# Patient Record
Sex: Male | Born: 1940 | Race: White | Hispanic: No | Marital: Single | Smoking: Never smoker
Health system: Southern US, Community
[De-identification: ages and names within clinical notes are randomized; demographics above are authoritative.]

## PROBLEM LIST (undated history)

## (undated) DIAGNOSIS — R079 Chest pain, unspecified: Secondary | ICD-10-CM

## (undated) DIAGNOSIS — N400 Enlarged prostate without lower urinary tract symptoms: Secondary | ICD-10-CM

## (undated) DIAGNOSIS — Q245 Malformation of coronary vessels: Secondary | ICD-10-CM

## (undated) DIAGNOSIS — I209 Angina pectoris, unspecified: Secondary | ICD-10-CM

## (undated) DIAGNOSIS — Z87442 Personal history of urinary calculi: Secondary | ICD-10-CM

## (undated) DIAGNOSIS — E079 Disorder of thyroid, unspecified: Secondary | ICD-10-CM

## (undated) DIAGNOSIS — E119 Type 2 diabetes mellitus without complications: Secondary | ICD-10-CM

## (undated) DIAGNOSIS — R9439 Abnormal result of other cardiovascular function study: Secondary | ICD-10-CM

## (undated) HISTORY — DX: Malformation of coronary vessels: Q24.5

## (undated) HISTORY — DX: Abnormal result of other cardiovascular function study: R94.39

## (undated) HISTORY — DX: Chest pain, unspecified: R07.9

## (undated) HISTORY — DX: Type 2 diabetes mellitus without complications: E11.9

## (undated) HISTORY — PX: TONSILLECTOMY: SUR1361

## (undated) HISTORY — DX: Benign prostatic hyperplasia without lower urinary tract symptoms: N40.0

## (undated) HISTORY — DX: Disorder of thyroid, unspecified: E07.9

---

## 2013-02-01 ENCOUNTER — Other Ambulatory Visit (HOSPITAL_COMMUNITY): Payer: Self-pay | Admitting: Urology

## 2013-02-01 DIAGNOSIS — C61 Malignant neoplasm of prostate: Secondary | ICD-10-CM

## 2013-02-15 ENCOUNTER — Encounter (HOSPITAL_COMMUNITY)
Admission: RE | Admit: 2013-02-15 | Discharge: 2013-02-15 | Disposition: A | Discharge status: Home / Self Care | Payer: Medicare Other | Source: Ambulatory Visit | Attending: Urology | Admitting: Urology

## 2013-02-15 DIAGNOSIS — C61 Malignant neoplasm of prostate: Secondary | ICD-10-CM

## 2013-02-15 MED ORDER — TECHNETIUM TC 99M MEDRONATE IV KIT
25.0000 | PACK | Freq: Once | INTRAVENOUS | Status: AC | PRN
  Administered 2013-02-15: 25 via INTRAVENOUS

## 2013-03-01 ENCOUNTER — Other Ambulatory Visit: Payer: Self-pay | Admitting: Internal Medicine

## 2013-03-01 DIAGNOSIS — R911 Solitary pulmonary nodule: Secondary | ICD-10-CM

## 2013-04-07 ENCOUNTER — Ambulatory Visit: Payer: Medicare Other | Admitting: Family Medicine

## 2013-04-07 VITALS — BP 120/76 | HR 55 | Temp 97.5°F | Resp 18 | Ht 69.0 in | Wt 152.0 lb

## 2013-04-07 DIAGNOSIS — Z0289 Encounter for other administrative examinations: Secondary | ICD-10-CM

## 2013-04-07 NOTE — Progress Notes (Signed)
ua-sp gr-1.010, glucose-neg, blood-neg, protein-neg       Chief Complaint:  Chief Complaint  Patient presents with  . Employment Physical    DOT    HPI: OSHEN Strong is a 73 y.o. 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 He last saw Charles Strong 2 weeks agoa and his HbA1c was 6.2.  He has had lpw sugars at 11 am, Fasting glucose was 66-67, he has hotflashes and jitters when he has sxs, he has these hypoglycemia sxs maybe 4 times every year,  HE denis any other sxs HE is a runner, runs 3 miles a day. Takes about 30 min a day , barefoot.  His heart rate is normally in the 50s, he denies CP, SOB, palpiatations.  Charles Strong dx 1993, metformin 500 mg QID, glipizide 5 mg daily No MI, No OSA  Past Medical History  Diagnosis Date  . Diabetes mellitus without complication    History reviewed. No pertinent past surgical history. History   Social History  . Marital Status: Unknown    Spouse Name: N/A    Number of Children: N/A  . Years of Education: N/A   Social History Main Topics  . Smoking status: Never Smoker   . Smokeless tobacco: None  . Alcohol Use: No  . Drug Use: No  . Sexual Activity: None   Other Topics Concern  . None   Social History Narrative  . None   History reviewed. No pertinent family history. No Known Allergies Prior to Admission medications   Medication Sig Start Date End Date Taking? Authorizing Provider  glipiZIDE (GLUCOTROL) 5 MG tablet Take 5 mg by mouth daily before breakfast.   Yes Historical Provider, MD  metFORMIN (GLUCOPHAGE) 500 MG tablet Take 500 mg by mouth 2 (two) times daily with a meal.   Yes Historical Provider, MD     ROS: The patient denies fevers, chills, night sweats, unintentional weight loss, chest pain, palpitations, wheezing, dyspnea on exertion, nausea, vomiting, abdominal pain, dysuria, hematuria, melena, numbness, weakness, or tingling.   All other systems have been reviewed and were  otherwise negative with the exception of those mentioned in the HPI and as above.    PHYSICAL EXAM: Filed Vitals:   04/07/13 1942  BP: 120/76  Pulse: 55  Temp: 97.5 F (36.4 C)  Resp: 18   Filed Vitals:   04/07/13 1942  Height: 5\' 9"  (1.753 m)  Weight: 152 lb (68.947 kg)   Body mass index is 22.44 kg/(m^2).  General: Alert, no acute distress HEENT:  Normocephalic, atraumatic, oropharynx patent. EOMI, PERRLA, fundscopic exam nl. TM nl Cardiovascular:  Regular rate and rhythm, no rubs murmurs or gallops.  No Carotid bruits, radial pulse intact. No pedal edema.  Respiratory: Clear to auscultation bilaterally.  No wheezes, rales, or rhonchi.  No cyanosis, no use of accessory musculature GI: No organomegaly, abdomen is soft and non-tender, positive bowel sounds.  No masses. Skin: No rashes. Neurologic: Facial musculature symmetric. Psychiatric: Patient is appropriate throughout our interaction. Lymphatic: No cervical lymphadenopathy Musculoskeletal: Gait intact. ROM nl 5/5 strength, sensation intact, 2/2 DTRs    LABS: No results found for this or any previous visit.   EKG/XRAY:   Primary read interpreted by Charles Strong at [facility].   ASSESSMENT/PLAN: Encounter Diagnosis  Name Primary?  . Other general medical examination for administrative purposes Yes   HE will return to get card after he gives me last OV and HbA1c  from Charles Strong office He would prefer this rather then getting a 3 month card since his card is already expired. Once I get the records then I Can write hm for a 1 year card F/u in 1 year if all is cleared  Gross sideeffects, risk and benefits, and alternatives of medications d/w patient. Patient is aware that all medications have potential sideeffects and we are unable to predict every sideeffect or drug-drug interaction that may occur.  LE, [place], DO 04/07/2013 8:28 PM

## 2013-06-16 ENCOUNTER — Other Ambulatory Visit: Payer: Self-pay | Admitting: Emergency Medicine

## 2013-06-17 LAB — HEPATITIS B SURFACE ANTIBODY, QUANTITATIVE: Hepatitis B-Post: 0 m[IU]/mL

## 2013-08-23 ENCOUNTER — Other Ambulatory Visit: Payer: Self-pay | Admitting: Urology

## 2013-08-23 DIAGNOSIS — C61 Malignant neoplasm of prostate: Secondary | ICD-10-CM

## 2013-08-30 ENCOUNTER — Ambulatory Visit
Admission: RE | Admit: 2013-08-30 | Discharge: 2013-08-30 | Disposition: A | Discharge status: Home / Self Care | Payer: Medicare Other | Source: Ambulatory Visit | Attending: Internal Medicine | Admitting: Internal Medicine

## 2013-08-30 DIAGNOSIS — R911 Solitary pulmonary nodule: Secondary | ICD-10-CM

## 2013-08-30 MED ORDER — IOHEXOL 300 MG/ML  SOLN
75.0000 mL | Freq: Once | INTRAMUSCULAR | Status: AC | PRN
  Administered 2013-08-30: 75 mL via INTRAVENOUS

## 2013-09-03 ENCOUNTER — Ambulatory Visit (HOSPITAL_COMMUNITY)
Admission: RE | Admit: 2013-09-03 | Discharge: 2013-09-03 | Disposition: A | Discharge status: Home / Self Care | Payer: Medicare Other | Source: Ambulatory Visit | Attending: Urology | Admitting: Urology

## 2013-09-03 DIAGNOSIS — C61 Malignant neoplasm of prostate: Secondary | ICD-10-CM

## 2013-09-03 DIAGNOSIS — K5730 Diverticulosis of large intestine without perforation or abscess without bleeding: Secondary | ICD-10-CM | POA: Insufficient documentation

## 2013-09-03 DIAGNOSIS — R972 Elevated prostate specific antigen [PSA]: Secondary | ICD-10-CM | POA: Insufficient documentation

## 2013-09-03 DIAGNOSIS — Z8546 Personal history of malignant neoplasm of prostate: Secondary | ICD-10-CM | POA: Insufficient documentation

## 2013-09-03 DIAGNOSIS — N400 Enlarged prostate without lower urinary tract symptoms: Secondary | ICD-10-CM | POA: Insufficient documentation

## 2013-09-03 LAB — POCT I-STAT CREATININE: Creatinine, Ser: 1 mg/dL (ref 0.50–1.35)

## 2013-09-03 MED ORDER — GADOBENATE DIMEGLUMINE 529 MG/ML IV SOLN
14.0000 mL | Freq: Once | INTRAVENOUS | Status: AC | PRN
  Administered 2013-09-03: 14 mL via INTRAVENOUS

## 2013-11-27 ENCOUNTER — Ambulatory Visit (INDEPENDENT_AMBULATORY_CARE_PROVIDER_SITE_OTHER): Payer: Medicare Other | Admitting: Family Medicine

## 2013-11-27 VITALS — BP 133/68 | HR 62 | Temp 98.4°F | Resp 16 | Ht 70.75 in | Wt 152.2 lb

## 2013-11-27 DIAGNOSIS — J029 Acute pharyngitis, unspecified: Secondary | ICD-10-CM

## 2013-11-27 DIAGNOSIS — B349 Viral infection, unspecified: Secondary | ICD-10-CM

## 2013-11-27 LAB — POCT RAPID STREP A (OFFICE): Rapid Strep A Screen: NEGATIVE

## 2013-11-27 MED ORDER — AMOXICILLIN 500 MG PO CAPS: 500.0000 mg | ORAL_CAPSULE | Freq: Three times a day (TID) | ORAL | Status: DC

## 2013-11-27 NOTE — Patient Instructions (Signed)
Sore Throat A sore throat is pain, burning, irritation, or scratchiness of the throat. There is often pain or tenderness when swallowing or talking. A sore throat may be accompanied by other symptoms, such as coughing, sneezing, fever, and swollen neck glands. A sore throat is often the first sign of another sickness, such as a cold, flu, strep throat, or mononucleosis (commonly known as mono). Most sore throats go away without medical treatment. CAUSES  The most common causes of a sore throat include:  A viral infection, such as a cold, flu, or mono.  A bacterial infection, such as strep throat, tonsillitis, or whooping cough.  Seasonal allergies.  Dryness in the air.  Irritants, such as smoke or pollution.  Gastroesophageal reflux disease (GERD). HOME CARE INSTRUCTIONS   Only take over-the-counter medicines as directed by your caregiver.  Drink enough fluids to keep your urine clear or pale yellow.  Rest as needed.  Try using throat sprays, lozenges, or sucking on hard candy to ease any pain (if older than 4 years or as directed).  Sip warm liquids, such as broth, herbal tea, or warm water with honey to relieve pain temporarily. You may also eat or drink cold or frozen liquids such as frozen ice pops.  Gargle with salt water (mix 1 tsp salt with 8 oz of water).  Do not smoke and avoid secondhand smoke.  Put a cool-mist humidifier in your bedroom at night to moisten the air. You can also turn on a hot shower and sit in the bathroom with the door closed for 5-10 minutes. SEEK IMMEDIATE MEDICAL CARE IF:  You have difficulty breathing.  You are unable to swallow fluids, soft foods, or your saliva.  You have increased swelling in the throat.  Your sore throat does not get better in 7 days.  You have nausea and vomiting.  You have a fever or persistent symptoms for more than 2-3 days.  You have a fever and your symptoms suddenly get worse. MAKE SURE YOU:   Understand  these instructions.  Will watch your condition.  Will get help right away if you are not doing well or get worse. Document Released: 03/21/2004 Document Revised: 01/29/2012 Document Reviewed: 10/20/2011 ExitCare Patient Information 2015 [employer]. This information is not intended to replace advice given to you by your health care provider. Make sure you discuss any questions you have with your health care provider.  

## 2013-11-27 NOTE — Progress Notes (Signed)
Chief Complaint:  Chief Complaint  Patient presents with  . Sore Throat  . Fever  . Cough    Mild    HPI: Charles Strong is a 73 y.o. male who is here for   Sore throat , fevers, cough x 1 day, subjective fevers and chills . Not coughing up anything. No facial pain or ear pain.  He denies chest pain, SOB or wheezing.  Has tried contact cold and flu, he fdeel ssomethat better.  Thraot pain is the biggest problems. He ahs a grandson who is 71 , and he has not been sick.  He ahs not had the flu vaccine, he does not get it sicne the last time he got it he got the "flu"   PCP: Charles Strong  Past Medical History  Diagnosis Date  . Diabetes mellitus without complication   . Thyroid disease    Past Surgical History  Procedure Laterality Date  . Tonsillectomy     History   Social History  . Marital Status: Single    Spouse Name: N/A    Number of Children: N/A  . Years of Education: N/A   Social History Main Topics  . Smoking status: Never Smoker   . Smokeless tobacco: Never Used  . Alcohol Use: No  . Drug Use: No  . Sexual Activity: None   Other Topics Concern  . None   Social History Narrative  . None   History reviewed. No pertinent family history. No Known Allergies Prior to Admission medications   Medication Sig Start Date End Date Taking? Authorizing Provider  glipiZIDE (GLUCOTROL) 5 MG tablet Take 5 mg by mouth daily before breakfast.   Yes Historical Provider, MD  LEVOTHYROXINE SODIUM PO Take 1 tablet by mouth daily.   Yes Historical Provider, MD  metFORMIN (GLUCOPHAGE) 500 MG tablet Take 500 mg by mouth 2 (two) times daily with a meal.   Yes Historical Provider, MD     ROS: The patient denies fevers, chills, night sweats, unintentional weight loss, chest pain, palpitations, wheezing, dyspnea on exertion, nausea, vomiting, abdominal pain, dysuria, hematuria, melena, numbness, weakness, or tingling.   All other systems have been reviewed and  were otherwise negative with the exception of those mentioned in the HPI and as above.    PHYSICAL EXAM: Filed Vitals:   11/27/13 1018  BP: 133/68  Pulse: 62  Temp: 98.4 F (36.9 C)  Resp: 16   Filed Vitals:   11/27/13 1018  Height: 5' 10.75" (1.797 m)  Weight: 152 lb 4 oz (69.06 kg)   Body mass index is 21.39 kg/(m^2).  General: Alert, no acute distress HEENT:  Normocephalic, atraumatic, oropharynx patent. EOMI, PERRLA, no exudates, eryth throat, no tonsils, tm normal, no facial tenderness Cardiovascular:  Regular rate and rhythm, no rubs murmurs or gallops.  No Carotid bruits, radial pulse intact. No pedal edema.  Respiratory: Clear to auscultation bilaterally.  No wheezes, rales, or rhonchi.  No cyanosis, no use of accessory musculature GI: No organomegaly, abdomen is soft and non-tender, positive bowel sounds.  No masses. Skin: No rashes. Neurologic: Facial musculature symmetric. Psychiatric: Patient is appropriate throughout our interaction. Lymphatic: No cervical lymphadenopathy Musculoskeletal: Gait intact.   LABS: Results for orders placed during the hospital encounter of 09/03/13  POCT I-STAT CREATININE      Result Value Ref Range   Creatinine, Ser 1.00  0.50 - 1.35 mg/dL     EKG/XRAY:   Primary read interpreted by  Dr. Marin Comment at [facility].   ASSESSMENT/PLAN: Encounter Diagnoses  Name Primary?  . Sore throat   . Viral illness Yes   Declines flu vaccine Otc treatment If no improvement then may take amoxacillin rx,  gave abx preacutions Throat cx pending F/u prn  Gross sideeffects, risk and benefits, and alternatives of medications d/w patient. Patient is aware that all medications have potential sideeffects and we are unable to predict every sideeffect or drug-drug interaction that may occur.  Charles Strong, [place], DO 11/27/2013 10:49 AM

## 2013-11-29 LAB — CULTURE, GROUP A STREP: Organism ID, Bacteria: NORMAL

## 2014-04-06 ENCOUNTER — Ambulatory Visit (INDEPENDENT_AMBULATORY_CARE_PROVIDER_SITE_OTHER): Payer: Self-pay | Admitting: Emergency Medicine

## 2014-04-06 VITALS — BP 128/70 | HR 53 | Temp 97.6°F | Resp 16 | Ht 69.75 in | Wt 153.2 lb

## 2014-04-06 DIAGNOSIS — Z021 Encounter for pre-employment examination: Secondary | ICD-10-CM

## 2014-04-06 NOTE — Progress Notes (Signed)
[facility] [address] [place] [address] [phone]  Date:  04/06/2014   Name:  TAVIAN CALLANDER   DOB:  Oct 17, 1940   MRN:  [id number]  PCP:  Delrae Rend, MD    Chief Complaint: Employment Physical   History of Present Illness:  DOMNICK CHERVENAK is a 74 y.o. very pleasant male patient who presents with the following:  DOT   There are no active problems to display for this patient.   Past Medical History  Diagnosis Date  . Diabetes mellitus without complication   . Thyroid disease     Past Surgical History  Procedure Laterality Date  . Tonsillectomy      History  Substance Use Topics  . Smoking status: Never Smoker   . Smokeless tobacco: Never Used  . Alcohol Use: No    No family history on file.  No Known Allergies  Medication list has been reviewed and updated.  Current Outpatient Prescriptions on File Prior to Visit  Medication Sig Dispense Refill  . LEVOTHYROXINE SODIUM PO Take 1 tablet by mouth daily.    . metFORMIN (GLUCOPHAGE) 500 MG tablet Take 500 mg by mouth 2 (two) times daily with a meal.     No current facility-administered medications on file prior to visit.    Review of Systems:  As per HPI, otherwise negative.    Physical Examination: Filed Vitals:   04/06/14 0915  BP: 128/70  Pulse: 53  Temp: 97.6 F (36.4 C)  Resp: 16   Filed Vitals:   04/06/14 0915  Height: 5' 9.75" (1.772 m)  Weight: 153 lb 3.2 oz (69.491 kg)   Body mass index is 22.13 kg/(m^2). Ideal Body Weight: Weight in (lb) to have BMI = 25: 172.6  GEN: WDWN, NAD, Non-toxic, A & O x 3 HEENT: Atraumatic, Normocephalic. Neck supple. No masses, No LAD. Ears and Nose: No external deformity. CV: RRR, No M/G/R. No JVD. No thrill. No extra heart sounds. PULM: CTA B, no wheezes, crackles, rhonchi. No retractions. No resp. distress. No accessory muscle use. ABD: S, NT, ND, +BS. No rebound. No HSM. EXTR: No c/c/e NEURO Normal gait.   PSYCH: Normally interactive. Conversant. Not depressed or anxious appearing.  Calm demeanor.    Assessment and Plan: DOT  Signed,  Ellison Carwin, MD

## 2014-09-08 ENCOUNTER — Telehealth: Payer: Self-pay | Admitting: Cardiovascular Disease

## 2014-09-08 NOTE — Telephone Encounter (Signed)
Received records from [place] for appointment on 10/11/14 with Dr Gwenlyn Found.  Records given to D Ricci Barker (medical records) for Dr Kennon Holter schedule on 10/11/14. lp

## 2014-10-11 ENCOUNTER — Ambulatory Visit: Payer: Self-pay | Admitting: Cardiovascular Disease

## 2014-11-02 ENCOUNTER — Ambulatory Visit (INDEPENDENT_AMBULATORY_CARE_PROVIDER_SITE_OTHER): Payer: Medicare HMO | Admitting: Cardiovascular Disease

## 2014-11-02 ENCOUNTER — Encounter: Payer: Self-pay | Admitting: Cardiovascular Disease

## 2014-11-02 VITALS — BP 114/66 | HR 58 | Ht 70.0 in | Wt 148.0 lb

## 2014-11-02 DIAGNOSIS — R079 Chest pain, unspecified: Secondary | ICD-10-CM

## 2014-11-02 DIAGNOSIS — R072 Precordial pain: Secondary | ICD-10-CM | POA: Diagnosis not present

## 2014-11-02 DIAGNOSIS — E119 Type 2 diabetes mellitus without complications: Secondary | ICD-10-CM | POA: Diagnosis not present

## 2014-11-02 DIAGNOSIS — E785 Hyperlipidemia, unspecified: Secondary | ICD-10-CM

## 2014-11-02 DIAGNOSIS — R7303 Prediabetes: Secondary | ICD-10-CM | POA: Insufficient documentation

## 2014-11-02 HISTORY — DX: Type 2 diabetes mellitus without complications: E11.9

## 2014-11-02 HISTORY — DX: Chest pain, unspecified: R07.9

## 2014-11-02 MED ORDER — NITROGLYCERIN 0.4 MG SL SUBL: 0.4000 mg | SUBLINGUAL_TABLET | SUBLINGUAL | Status: DC | PRN

## 2014-11-02 NOTE — Progress Notes (Signed)
Cardiology Office Note   Date:  11/02/2014   ID:  CARSTON RIEDL, DOB 06-May-1940, MRN [id number]  PCP:  Delrae Rend, MD  Cardiologist:   Sharol Harness, MD   Chief Complaint  Patient presents with  . New Evaluation  . Chest Pain    No SOB or swelling.      History of Present Illness: Charles Strong is a 74 y.o. male with diabetes, type 2, hyperlipidemia, and Hashimotos's thyroiditis who presents for an evaluation of chest pain.  Mr. Wadel reports an isolated episode of chest tightness that occurred while running three months ago.  The episode lasted for 5-6 minutes and was mild in severity.  It was not associated with shortness of breath, palpitations, lightheadedness, dizziness, nausea or vomiting.  He runs for five miles daily and has not had any recurrent episodes.  He mentioned the episode to his PCP, [name] on 09/05/14 and was referred to cardiology for further evaluation.  Dr. Buddy Duty has been recommending a statin, but Mr. Vosler declines.  He is afraid of the things he has heard about statins and prefers to take supplements, including red yeast rice, instead.  Mr. Hoel denies orthopnea, PND or LE edema.  In addition to daily exercise he eats a healthy diet and has lost 7 lb in the last 6 months.  This was unintentional, but he attributes this to not eating as much or drinking any alcohol now that he does not have frequent dinners with his daughter.    Past Medical History  Diagnosis Date  . Diabetes mellitus without complication   . Thyroid disease     Past Surgical History  Procedure Laterality Date  . Tonsillectomy       Current Outpatient Prescriptions  Medication Sig Dispense Refill  . Alpha-Lipoic Acid (LIPOIC ACID PO) Take 300 mg by mouth daily.    . APPLE CIDER VINEGAR PO Take 250 mg by mouth daily.    Marland Kitchen aspirin 81 MG tablet Take 81 mg by mouth daily.    . Cholecalciferol (VITAMIN D3) 5000 UNITS CAPS Take by mouth once a week.      . Coenzyme Q10 (CO Q 10 PO) Take 200 mg by mouth daily.    . Digestive Enzymes (ENZYME DIGEST PO) Take by mouth daily.    Marland Kitchen GARLIC PO Take by mouth 3 (three) times daily. TAKES 2 CAPSULES THREE TIMES DAILY    . glipiZIDE (GLUCOTROL XL) 5 MG 24 hr tablet Take 5 mg by mouth as needed.  2  . Hyaluronic Acid-Vitamin C (HYALURONIC ACID PO) Take 50 mg by mouth daily.    Marland Kitchen LEVOTHYROXINE SODIUM PO Take 75 mg by mouth daily.     Marland Kitchen liothyronine (CYTOMEL) 5 MCG tablet Take 5 mcg by mouth daily.    . metFORMIN (GLUCOPHAGE-XR) 500 MG 24 hr tablet Take 500 mg by mouth 4 (four) times daily.  4  . Multiple Vitamins-Minerals (ONE-A-DAY ENERGY) TABS Take by mouth daily.    . Nattokinase 100 MG CAPS Take 100 mg by mouth daily.    . Omega-3 Fatty Acids (FISH OIL) 1000 MG CAPS Take by mouth daily.    . Red Yeast Rice Extract (RED YEAST RICE PO) Take 1,200 mg by mouth 3 (three) times daily. TAKES THREE TABLETS WITH EACH MEAL.    Marland Kitchen Specialty Vitamins Products (PROSTATE PO) Take by mouth daily.    Marland Kitchen TAURINE PO Take by mouth daily.    Marland Kitchen Ubiquinol Liposomal (QH  PO) Take 100 mg by mouth.    . vitamin C (ASCORBIC ACID) 500 MG tablet Take 500 mg by mouth daily.    . Vitamins A & D (VITAMIN A & D) 10000-400 UNITS CAPS Take by mouth daily.    . Vitamins-Lipotropics (B-100 PO) Take by mouth daily.    . Wheat Dextrin (BENEFIBER PO) Take by mouth daily.    . WHEY PROTEIN PO Take by mouth daily.    . WHITE KIDNEY BEAN PO Take 500 mg by mouth 3 (three) times daily.    . Zinc 25 MG TABS Take 25 mg by mouth daily.    . nitroGLYCERIN (NITROSTAT) 0.4 MG SL tablet Place 1 tablet (0.4 mg total) under the tongue every 5 (five) minutes as needed for chest pain. 25 tablet 6   No current facility-administered medications for this visit.    Allergies:   Review of patient's allergies indicates no known allergies.    Social History:  The patient  reports that he has never smoked. He has never used smokeless tobacco. He reports that  he does not drink alcohol or use illicit drugs.   Family History:  The patient's family history is not on file.    ROS:  Please see the history of present illness.   Otherwise, review of systems are positive for none.   All other systems are reviewed and negative.    PHYSICAL EXAM: VS:  BP 114/66 mmHg  Pulse 58  Ht 5\' 10"  (1.778 m)  Wt 67.132 kg (148 lb)  BMI 21.24 kg/m2 , BMI Body mass index is 21.24 kg/(m^2). GENERAL:  Well appearing HEENT:  Pupils equal round and reactive, fundi not visualized, oral mucosa unremarkable NECK:  No jugular venous distention, waveform within normal limits, carotid upstroke brisk and symmetric, no bruits, no thyromegaly LYMPHATICS:  No cervical adenopathy LUNGS:  Clear to auscultation bilaterally HEART:  RRR.  PMI not displaced or sustained,S1 and S2 within normal limits, no S3, no S4, no clicks, no rubs, no murmurs ABD:  Flat, positive bowel sounds normal in frequency in pitch, no bruits, no rebound, no guarding, no midline pulsatile mass, no hepatomegaly, no splenomegaly EXT:  2 plus pulses throughout, no edema, no cyanosis no clubbing SKIN:  No rashes no nodules NEURO:  Cranial nerves II through XII grossly intact, motor grossly intact throughout PSYCH:  Cognitively intact, oriented to person place and time    EKG:  EKG is ordered today. The ekg ordered today demonstrates sinus bradycardia at 58 bpm.   Recent Labs: No results found for requested labs within last 365 days.    Lipid Panel No results found for: CHOL, TRIG, HDL, CHOLHDL, VLDL, LDLCALC, LDLDIRECT   Hgb A1c 6.6% Na 142, K 4.3, BUN 12, Creatinine 0.91 AST 22, ALT 24 Chol 129, tri 66, HDL 46, LDL 69  Wt Readings from Last 3 Encounters:  11/02/14 67.132 kg (148 lb)  04/06/14 69.491 kg (153 lb 3.2 oz)  11/27/13 69.06 kg (152 lb 4 oz)      Other studies Reviewed: Additional studies/ records that were reviewed today include: n/a. Review of the above records demonstrates:   Please see elsewhere in the note.  n/a   ASSESSMENT AND PLAN:  # Chest pain: Mr. Meir chest pain occurred 3 months ago and has not recurred since despite the fact that he runs 5 miles daily. This makes it less likely that his symptoms were ischemic in nature. However he does have several risk factors including  age, diabetes, and hyperlipidemia. Therefore we will pursue an exercise treadmill stress test. He has been prescribed nitroglycerin sublingual when necessary and will report to the emergency department if he develops chest pain that does not resolve after taking nitroglycerin.   # Hyperlipidemia:  ASCVD 10 year risk is 24.6%.  We discussed statin therapy, as he has discussed with his PCP Dr. Buddy Duty.  He will think about it and follow up with Dr. Buddy Duty if he is willing to try either atorvastatin or rosuvastatin.  Current medicines are reviewed at length with the patient today.  The patient does not have concerns regarding medicines.  The following changes have been made:  no change  Labs/ tests ordered today include:   Orders Placed This Encounter  Procedures  . Exercise Tolerance Test  . EKG 12-Lead     Disposition:   FU with Dr. Jonelle Sidle C. Oval Linsey prn   Signed, Sharol Harness, MD  11/02/2014 6:33 PM    [place]

## 2014-11-02 NOTE — Patient Instructions (Signed)
Your physician has requested that you have an exercise tolerance test. For further information please visit [email or web address]. Please also follow instruction sheet, as given.    Your physician recommends that you schedule a follow-up appointment on an as needed basis--    Nitroglycerin sublingual tablets What is this medicine? NITROGLYCERIN (nye troe GLI ser in) is a type of vasodilator. It relaxes blood vessels, increasing the blood and oxygen supply to your heart. This medicine is used to relieve chest pain caused by angina. It is also used to prevent chest pain before activities like climbing stairs, going outdoors in cold weather, or sexual activity. This medicine may be used for other purposes; ask your health care provider or pharmacist if you have questions. COMMON BRAND NAME(S): Nitroquick, Nitrostat, Nitrotab What should I tell my health care provider before I take this medicine? They need to know if you have any of these conditions: -anemia -head injury, recent stroke, or bleeding in the brain -liver disease -previous heart attack -an unusual or allergic reaction to nitroglycerin, other medicines, foods, dyes, or preservatives -pregnant or trying to get pregnant -breast-feeding How should I use this medicine? Take this medicine by mouth as needed. At the first sign of an angina attack (chest pain or tightness) place one tablet under your tongue. You can also take this medicine 5 to 10 minutes before an event likely to produce chest pain. Follow the directions on the prescription label. Let the tablet dissolve under the tongue. Do not swallow whole. Replace the dose if you accidentally swallow it. It will help if your mouth is not dry. Saliva around the tablet will help it to dissolve more quickly. Do not eat or drink, smoke or chew tobacco while a tablet is dissolving. If you are not better within 5 minutes after taking ONE dose of nitroglycerin, call 9-1-1 immediately to seek  emergency medical care. Do not take more than 3 nitroglycerin tablets over 15 minutes. If you take this medicine often to relieve symptoms of angina, your doctor or health care professional may provide you with different instructions to manage your symptoms. If symptoms do not go away after following these instructions, it is important to call 9-1-1 immediately. Do not take more than 3 nitroglycerin tablets over 15 minutes. Talk to your pediatrician regarding the use of this medicine in children. Special care may be needed. Overdosage: If you think you have taken too much of this medicine contact a poison control center or emergency room at once. NOTE: This medicine is only for you. Do not share this medicine with others. What if I miss a dose? This does not apply. This medicine is only used as needed. What may interact with this medicine? Do not take this medicine with any of the following medications: -certain migraine medicines like ergotamine and dihydroergotamine (DHE) -medicines used to treat erectile dysfunction like sildenafil, tadalafil, and vardenafil -riociguat This medicine may also interact with the following medications: -alteplase -aspirin -heparin -medicines for high blood pressure -medicines for mental depression -other medicines used to treat angina -phenothiazines like chlorpromazine, mesoridazine, prochlorperazine, thioridazine This list may not describe all possible interactions. Give your health care provider a list of all the medicines, herbs, non-prescription drugs, or dietary supplements you use. Also tell them if you smoke, drink alcohol, or use illegal drugs. Some items may interact with your medicine. What should I watch for while using this medicine? Tell your doctor or health care professional if you feel your medicine is no longer working.  Keep this medicine with you at all times. Sit or lie down when you take your medicine to prevent falling if you feel dizzy or  faint after using it. Try to remain calm. This will help you to feel better faster. If you feel dizzy, take several deep breaths and lie down with your feet propped up, or bend forward with your head resting between your knees. You may get drowsy or dizzy. Do not drive, use machinery, or do anything that needs mental alertness until you know how this drug affects you. Do not stand or sit up quickly, especially if you are an older patient. This reduces the risk of dizzy or fainting spells. Alcohol can make you more drowsy and dizzy. Avoid alcoholic drinks. Do not treat yourself for coughs, colds, or pain while you are taking this medicine without asking your doctor or health care professional for advice. Some ingredients may increase your blood pressure. What side effects may I notice from receiving this medicine? Side effects that you should report to your doctor or health care professional as soon as possible: -blurred vision -dry mouth -skin rash -sweating -the feeling of extreme pressure in the head -unusually weak or tired Side effects that usually do not require medical attention (report to your doctor or health care professional if they continue or are bothersome): -flushing of the face or neck -headache -irregular heartbeat, palpitations -nausea, vomiting This list may not describe all possible side effects. Call your doctor for medical advice about side effects. You may report side effects to FDA at 1-800-FDA-1088. Where should I keep my medicine? Keep out of the reach of children. Store at room temperature between 20 and 25 degrees C (68 and 77 degrees F). Store in [employer]. Protect from light and moisture. Keep tightly closed. Throw away any unused medicine after the expiration date. NOTE: This sheet is a summary. It may not cover all possible information. If you have questions about this medicine, talk to your doctor, pharmacist, or health care provider.  2015,  Elsevier/Gold Standard. (2012-12-10 17:57:36)

## 2014-11-25 ENCOUNTER — Telehealth (HOSPITAL_COMMUNITY): Payer: Self-pay

## 2014-11-25 NOTE — Telephone Encounter (Signed)
Encounter complete. 

## 2014-11-30 ENCOUNTER — Ambulatory Visit (HOSPITAL_COMMUNITY)
Admission: RE | Admit: 2014-11-30 | Discharge: 2014-11-30 | Disposition: A | Discharge status: Home / Self Care | Payer: Medicare HMO | Source: Ambulatory Visit | Attending: Cardiovascular Disease | Admitting: Cardiovascular Disease

## 2014-11-30 DIAGNOSIS — R072 Precordial pain: Secondary | ICD-10-CM

## 2014-11-30 DIAGNOSIS — R079 Chest pain, unspecified: Secondary | ICD-10-CM | POA: Diagnosis not present

## 2014-11-30 DIAGNOSIS — R9439 Abnormal result of other cardiovascular function study: Secondary | ICD-10-CM | POA: Diagnosis not present

## 2014-12-01 LAB — EXERCISE TOLERANCE TEST
Estimated workload: 11.8 METS
Exercise duration (min): 10 min
Exercise duration (sec): 3 s
MPHR: 148 {beats}/min
Peak HR: 169 {beats}/min
Percent HR: 114 %
RPE: 16
Rest HR: 65 {beats}/min

## 2014-12-05 ENCOUNTER — Telehealth: Payer: Self-pay | Admitting: *Deleted

## 2014-12-05 DIAGNOSIS — D689 Coagulation defect, unspecified: Secondary | ICD-10-CM

## 2014-12-05 DIAGNOSIS — Z01818 Encounter for other preprocedural examination: Secondary | ICD-10-CM

## 2014-12-05 DIAGNOSIS — R9430 Abnormal result of cardiovascular function study, unspecified: Secondary | ICD-10-CM

## 2014-12-05 NOTE — Telephone Encounter (Signed)
-----   Message from Skeet Latch, MD sent at 12/04/2014 11:12 AM EDT ----- Abnormal stress that is concerning for areas of the heart that don't get enough blood flow with exercise.  Please arrange for cath.

## 2014-12-05 NOTE — Telephone Encounter (Signed)
Spoke to patient. Result given . Verbalized understanding Pt would like to proceed with cardiac cath. He states he will call back with the time he is available Lab ordered. Left heart cath ordered

## 2014-12-15 ENCOUNTER — Telehealth: Payer: Self-pay | Admitting: Cardiovascular Disease

## 2014-12-15 DIAGNOSIS — E785 Hyperlipidemia, unspecified: Secondary | ICD-10-CM

## 2014-12-15 NOTE — Telephone Encounter (Signed)
Mr. Charles Strong is asking if Dr. Oval Linsey can help him with something , Would not say what it is,, Please call him on his cell at [phone]   Thanks

## 2014-12-15 NOTE — Telephone Encounter (Signed)
Pt stated that during an OV Dr. Oval Linsey recommended that he have a heart cath and pt stated that after researching he things that is to risky.  Pt stated he thinks he would feel more comfortable having a "heart ultrasound'.  Pt educated on the differences between an echocardiogram and a heart cath. Told pt i would share his concerns with MD and our office will get back with him once we hear back from her.  Pt verbalized understanding no questions at this time.

## 2014-12-19 DIAGNOSIS — E785 Hyperlipidemia, unspecified: Secondary | ICD-10-CM | POA: Diagnosis not present

## 2014-12-19 MED ORDER — ISOSORBIDE MONONITRATE ER 30 MG PO TB24: 30.0000 mg | ORAL_TABLET | Freq: Every day | ORAL | Status: DC

## 2014-12-19 NOTE — Telephone Encounter (Signed)
I spoke with Charles Strong this weekend and he is not interested in having a heart catheterization at this time.  He prefers to manage his symptoms medically.  We will start Imdur 30 mg daily.  He will have his lipids checked and symptoms assessed at his follow up appointment in one month.

## 2014-12-19 NOTE — Telephone Encounter (Signed)
Called patient and informed him that MD has advised Rx for Imdur 30mg  and lipid panel. Patient voiced understanding. He was scheduled for follow up with Dr. Gwenlyn Found on 11/15 (as he was supposed to have [place] with him but decided against this) and this appointment has been moved to Dr. Blenda Mounts schedule for Friday 01/06/15.   Lab & med ordered. Patient aware he will need fasting labs prior to OV. Provided location for [facility] in our building.

## 2014-12-20 DIAGNOSIS — R69 Illness, unspecified: Secondary | ICD-10-CM | POA: Diagnosis not present

## 2014-12-20 NOTE — Telephone Encounter (Signed)
Per recent telephone message. Patient has decided not to have heart catheterization.

## 2014-12-21 LAB — LIPID PANEL
Cholesterol: 163 mg/dL (ref 125–200)
HDL: 63 mg/dL (ref >=40)
LDL Cholesterol: 85 mg/dL (ref <130)
Total CHOL/HDL Ratio: 2.6 Ratio (ref <=5.0)
Triglycerides: 73 mg/dL (ref <150)
VLDL: 15 mg/dL (ref <30)

## 2014-12-22 ENCOUNTER — Telehealth: Payer: Self-pay | Admitting: *Deleted

## 2014-12-22 DIAGNOSIS — R69 Illness, unspecified: Secondary | ICD-10-CM | POA: Diagnosis not present

## 2014-12-22 NOTE — Telephone Encounter (Signed)
-----   Message from Skeet Latch, MD sent at 12/22/2014  8:11 AM EDT ----- Cholesterol numbers are good.  We still recommend a low-dose statin based on his diabetes and likely coronary disease.  However, we can discuss this when he follows up.

## 2014-12-22 NOTE — Telephone Encounter (Signed)
Spoke to patient. Result given . Verbalized understanding  appt 01/06/15 w/ DR [facility]

## 2015-01-06 ENCOUNTER — Ambulatory Visit (INDEPENDENT_AMBULATORY_CARE_PROVIDER_SITE_OTHER): Payer: Medicare HMO | Admitting: Cardiovascular Disease

## 2015-01-06 ENCOUNTER — Encounter: Payer: Self-pay | Admitting: Cardiovascular Disease

## 2015-01-06 VITALS — BP 94/52 | HR 56 | Ht 70.0 in | Wt 149.0 lb

## 2015-01-06 DIAGNOSIS — I2510 Atherosclerotic heart disease of native coronary artery without angina pectoris: Secondary | ICD-10-CM | POA: Diagnosis not present

## 2015-01-06 DIAGNOSIS — R9439 Abnormal result of other cardiovascular function study: Secondary | ICD-10-CM

## 2015-01-06 NOTE — Progress Notes (Signed)
Cardiology Office Note   Date:  01/09/2015  ID:  [name], DOB May 26, 1940, MRN [id number]  PCP:  Charles Rend, MD  Cardiologist:   Sharol Harness, MD   Chief Complaint  Patient presents with  . Follow-up    STRESS TEST, ONGOING CHEST PAIN       Patient ID: Charles Strong is a 74 y.o. male with diabetes, type 2, hyperlipidemia, and Hashimotos's thyroiditis who presents for follow up on medically managed CAD.  Interval history 01/06/15: Charles Strong had an abnrmal stress test and declined cath.  He was found to have ST depression in the inferior leads that occurred 10 minutes into exercise. He prefers to try medical management.  He started Imdur 30 and continues to have episodes of chest discomfort. He has cut back his running from 5 miles 7 days a week to 1 mile 3 days per week. This is partially due to chest discomfort and partially due to fear of worsening his heart disease. Charles Strong also has left arm pain and reports an electrical shooting pain that goes down his arm with movement. He is noted less arm pain since starting Imdur.    Charles Strong has started taking several over-the-counter supplements that are supposed to be good for cardiovascular health. He is also changed his diet by eliminating carbohydrates and increasing his intake of vegetables.  History of Present Illness Charles Strong reports an isolated episode of chest tightness that occurred while running three months ago.  The episode lasted for 5-6 minutes and was mild in severity.  It was not associated with shortness of breath, palpitations, lightheadedness, dizziness, nausea or vomiting.  He runs for five miles daily and has not had any recurrent episodes.  He mentioned the episode to his PCP, Dr. Delrae Strong on 09/05/14 and was referred to cardiology for further evaluation.  Dr. Buddy Strong has been recommending a statin, but Charles Strong declines.  He is afraid of the things he has heard about  statins and prefers to take supplements, including red yeast rice, instead.  Charles Strong denies orthopnea, PND or LE edema.  In addition to daily exercise he eats a healthy diet and has lost 7 lb in the last 6 months.  This was unintentional, but he attributes this to not eating as much or drinking any alcohol now that he does not have frequent dinners with his daughter.    Past Medical History  Diagnosis Date  . Diabetes mellitus without complication ([place])   . Thyroid disease   . Chest pain 11/02/2014  . Diabetes type 2, controlled ([place]) 11/02/2014  . Abnormal stress ECG 01/09/2015    Past Surgical History  Procedure Laterality Date  . Tonsillectomy       Current Outpatient Prescriptions  Medication Sig Dispense Refill  . Acetylcysteine (NAC 600 PO) Take 1 tablet by mouth 2 (two) times daily.    . Arginine 1000 MG TABS Take 1 tablet by mouth 2 (two) times daily.    . Ascorbic Acid (VITAMIN C) 1000 MG tablet Take 1,000 mg by mouth daily.    Marland Kitchen CITRULLINE 1000 PO Take 1 tablet by mouth daily.    . Coconut Oil 1000 MG CAPS Take 1 capsule by mouth 2 (two) times daily.    . Ginger, Zingiber officinalis, (GINGER ROOT) 550 MG CAPS Take 1 capsule by mouth daily.    . Lecithin 1200 MG CAPS Take 1 capsule by mouth daily.    . Magnesium 250 MG  TABS Take 1 tablet by mouth 4 (four) times daily.    . Potassium 99 MG TABS Take 4 tablets by mouth daily.    . Alpha-Lipoic Acid (LIPOIC ACID PO) Take 100 mg by mouth daily.     . APPLE CIDER VINEGAR PO Take 250 mg by mouth daily.    Marland Kitchen aspirin 81 MG tablet Take 81 mg by mouth daily.    . Cholecalciferol (VITAMIN D3) 5000 UNITS CAPS Take by mouth once a week.    . Coenzyme Q10 (CO Q 10 PO) Take 200 mg by mouth daily.    . Digestive Enzymes (ENZYME DIGEST PO) Take by mouth daily.    Marland Kitchen GARLIC PO Take by mouth. TAKES 3 CAPSULES THREE TIMES DAILY    . glipiZIDE (GLUCOTROL XL) 5 MG 24 hr tablet Take 5 mg by mouth as needed (DIABETES).   2  . Hyaluronic  Acid-Vitamin C (HYALURONIC ACID PO) Take 50 mg by mouth daily.    . isosorbide mononitrate (IMDUR) 30 MG 24 hr tablet Take 1 tablet (30 mg total) by mouth daily. 30 tablet 3  . LEVOTHYROXINE SODIUM PO Take 75 mg by mouth daily.     Marland Kitchen liothyronine (CYTOMEL) 5 MCG tablet Take 5 mcg by mouth daily.    . metFORMIN (GLUCOPHAGE-XR) 500 MG 24 hr tablet Take 500 mg by mouth 4 (four) times daily.  4  . Multiple Vitamins-Minerals (ONE-A-DAY ENERGY) TABS Take by mouth daily.    . Nattokinase 100 MG CAPS Take 100 mg by mouth daily.    . nitroGLYCERIN (NITROSTAT) 0.4 MG SL tablet Place 1 tablet (0.4 mg total) under the tongue every 5 (five) minutes as needed for chest pain. 25 tablet 6  . Omega-3 Fatty Acids (FISH OIL) 1000 MG CAPS Take by mouth daily.    Glory Rosebush VERIO test strip CHECK BLOOD SUGARS TID  5  . Red Yeast Rice Extract (RED YEAST RICE PO) Take 1,200 mg by mouth 3 (three) times daily. TAKES THREE TABLETS WITH EACH MEAL.    Marland Kitchen Specialty Vitamins Products (PROSTATE PO) Take by mouth daily.    Marland Kitchen TAURINE PO Take by mouth daily.    Marland Kitchen Ubiquinol Liposomal (QH PO) Take 100 mg by mouth.    . Vitamins A & D (VITAMIN A & D) 10000-400 UNITS CAPS Take by mouth daily.    . Vitamins-Lipotropics (B-100 PO) Take by mouth daily.    . WHEY PROTEIN PO Take by mouth daily.    . Zinc 25 MG TABS Take 25 mg by mouth daily.     No current facility-administered medications for this visit.    Allergies:   Review of patient's allergies indicates no known allergies.    Social History:  The patient  reports that he has never smoked. He has never used smokeless tobacco. He reports that he does not drink alcohol or use illicit drugs.   Family History:  The patient's family history is not on file.    ROS:  Please see the history of present illness.  Otherwise, review of systems are positive for none.   All other systems are reviewed and negative.    PHYSICAL EXAM: VS:  BP 94/52 mmHg  Pulse 56  Ht 5\' 10"  (1.778 m)   Wt 67.586 kg (149 lb)  BMI 21.38 kg/m2 , BMI Body mass index is 21.38 kg/(m^2). GENERAL:  Well appearing HEENT:  Pupils equal round and reactive, fundi not visualized, oral mucosa unremarkable NECK:  No jugular venous  distention, waveform within normal limits, carotid upstroke brisk and symmetric, no bruits, no thyromegaly LYMPHATICS:  No cervical adenopathy LUNGS:  Clear to auscultation bilaterally HEART:  RRR.  PMI not displaced or sustained,S1 and S2 within normal limits, no S3, no S4, no clicks, no rubs, no murmurs ABD:  Flat, positive bowel sounds normal in frequency in pitch, no bruits, no rebound, no guarding, no midline pulsatile mass, no hepatomegaly, no splenomegaly EXT:  2 plus pulses throughout, no edema, no cyanosis no clubbing SKIN:  No rashes no nodules NEURO:  Cranial nerves II through XII grossly intact, motor grossly intact throughout PSYCH:  Cognitively intact, oriented to person place and time   EKG:  EKG is not ordered today. The ekg ordered today demonstrates sinus bradycardia at 58 bpm.  11/30/14 ETT:  Blood pressure demonstrated a hypertensive response to exercise.  Horizontal ST segment depression ST segment depression was noted during stress in the II, III and aVF leads, beginning at 10 minutes of stress, and returning to baseline after less than 1 minute of recovery.   Recent Labs: No results found for requested labs within last 365 days.    Lipid Panel    Component Value Date/Time   CHOL 163 12/19/2014 0913   TRIG 73 12/19/2014 0913   HDL 63 12/19/2014 0913   CHOLHDL 2.6 12/19/2014 0913   VLDL 15 12/19/2014 0913   LDLCALC 85 12/19/2014 0913     Hgb A1c 6.6% Na 142, K 4.3, BUN 12, Creatinine 0.91 AST 22, ALT 24 Chol 129, tri 66, HDL 46, LDL 69  Wt Readings from Last 3 Encounters:  01/06/15 67.586 kg (149 lb)  11/02/14 67.132 kg (148 lb)  04/06/14 69.491 kg (153 lb 3.2 oz)     Other studies Reviewed: Additional studies/ records that were  reviewed today include: n/a. Review of the above records demonstrates:  Please see elsewhere in the note.  n/a   ASSESSMENT AND PLAN:  # CAD/ angina: Mr. Noguez had a positive stress test and continues to have episodes of chest discomfort that are concerning for ischemia. His symptoms have improved with Imdur but persist. He continues to be on interested in pursuing cardiac catheterization.  We discussed the fact that I cannot determine the severity of his potential cardiac disease based on a treadmill stress test alone. We will pursue coronary CT angiography to assess the severity of his underlying coronary disease. If he has disease that is high risk or surgical disease he will consider more aggressive medical care. He does not have any evidence of heart failure on exam.  His blood pressure is too add a beta blocker or titrate his long-acting nitrates. We will consider adding Ranexa based on the results of his CT angiography.  # Hyperlipidemia:  ASCVD 10 year risk is 24.6%.  He now also has a diagnosis of CAD.  We discussed statin therapy, as he has discussed with his PCP Dr. Buddy Strong.  Charles Strong remains on interested in starting a statin and prefers to continue red yeast Rice.  Current medicines are reviewed at length with the patient today.  The patient does not have concerns regarding medicines.  The following changes have been made:  no change  Labs/ tests ordered today include:   Orders Placed This Encounter  Procedures  . CT ANGIO CHEST AORTA W/CM &/OR WO/CM  . Basic metabolic panel     Disposition:   FU with Dr. Jonelle Sidle C. [place] in 6 months.   Signed, Skeet Latch  P, MD  01/09/2015 6:09 PM    [facility]

## 2015-01-06 NOTE — Patient Instructions (Signed)
Dr Oval Linsey has ordered a CT Angiogram of the chest. This will be done at our [facility] location. The address is [address]. The phone number is [phone].  You will have to have some blood work done prior to this test. It can be done at any [place] lab.  Dr Oval Linsey recommends that you schedule a follow-up appointment in 6 months. You will receive a reminder letter in the mail two months in advance. If you don't receive a letter, please call our office to schedule the follow-up appointment.  If you need a refill on your cardiac medications before your next appointment, please call your pharmacy.

## 2015-01-09 ENCOUNTER — Encounter: Payer: Self-pay | Admitting: Cardiovascular Disease

## 2015-01-09 DIAGNOSIS — R9439 Abnormal result of other cardiovascular function study: Secondary | ICD-10-CM

## 2015-01-09 HISTORY — DX: Abnormal result of other cardiovascular function study: R94.39

## 2015-01-10 ENCOUNTER — Ambulatory Visit: Payer: Self-pay | Admitting: Cardiovascular Disease

## 2015-01-10 DIAGNOSIS — I2510 Atherosclerotic heart disease of native coronary artery without angina pectoris: Secondary | ICD-10-CM | POA: Diagnosis not present

## 2015-01-11 ENCOUNTER — Other Ambulatory Visit: Payer: Self-pay

## 2015-01-11 ENCOUNTER — Telehealth: Payer: Self-pay | Admitting: Cardiovascular Disease

## 2015-01-11 DIAGNOSIS — I2510 Atherosclerotic heart disease of native coronary artery without angina pectoris: Secondary | ICD-10-CM

## 2015-01-11 LAB — BASIC METABOLIC PANEL
BUN: 16 mg/dL (ref 7–25)
CO2: 30 mmol/L (ref 20–31)
Calcium: 9 mg/dL (ref 8.6–10.3)
Chloride: 103 mmol/L (ref 98–110)
Creat: 0.8 mg/dL (ref 0.70–1.18)
Glucose, Bld: 119 mg/dL — ABNORMAL HIGH (ref 65–99)
Potassium: 4.5 mmol/L (ref 3.5–5.3)
Sodium: 141 mmol/L (ref 135–146)

## 2015-01-11 NOTE — Telephone Encounter (Signed)
Spoke to [facility] She wanted to know if test schedule at [address] can be cancelled.reschedule to [facility] ([facility]) RN -yes may cancel test.

## 2015-01-12 ENCOUNTER — Inpatient Hospital Stay: Admission: RE | Admit: 2015-01-12 | Payer: Self-pay | Source: Ambulatory Visit

## 2015-01-17 ENCOUNTER — Telehealth: Payer: Self-pay | Admitting: *Deleted

## 2015-01-17 NOTE — Telephone Encounter (Signed)
-----   Message from Skeet Latch, MD sent at 01/15/2015  8:22 PM EST ----- Normal kidney function and electrolytes.

## 2015-01-17 NOTE — Telephone Encounter (Signed)
Left message to call back  

## 2015-01-18 ENCOUNTER — Encounter: Payer: Self-pay | Admitting: *Deleted

## 2015-01-18 NOTE — Telephone Encounter (Signed)
Pt is returning your call. Thanks

## 2015-01-18 NOTE — Telephone Encounter (Signed)
Spoke to patient. Result given . Verbalized understanding Patient wanted a copy of labs Patient question - that he has not heard back about CT test-message was sent to scheduler to contact  Follow up

## 2015-01-21 DIAGNOSIS — R69 Illness, unspecified: Secondary | ICD-10-CM | POA: Diagnosis not present

## 2015-01-23 ENCOUNTER — Encounter: Payer: Self-pay | Admitting: Cardiology

## 2015-01-27 ENCOUNTER — Encounter (HOSPITAL_COMMUNITY): Payer: Self-pay

## 2015-01-27 ENCOUNTER — Ambulatory Visit (HOSPITAL_COMMUNITY)
Admission: RE | Admit: 2015-01-27 | Discharge: 2015-01-27 | Disposition: A | Discharge status: Home / Self Care | Payer: Medicare HMO | Source: Ambulatory Visit | Attending: Cardiovascular Disease | Admitting: Cardiovascular Disease

## 2015-01-27 DIAGNOSIS — C61 Malignant neoplasm of prostate: Secondary | ICD-10-CM | POA: Diagnosis not present

## 2015-01-27 DIAGNOSIS — I25118 Atherosclerotic heart disease of native coronary artery with other forms of angina pectoris: Secondary | ICD-10-CM | POA: Diagnosis not present

## 2015-01-27 DIAGNOSIS — Z1389 Encounter for screening for other disorder: Secondary | ICD-10-CM | POA: Diagnosis not present

## 2015-01-27 DIAGNOSIS — R918 Other nonspecific abnormal finding of lung field: Secondary | ICD-10-CM | POA: Diagnosis not present

## 2015-01-27 DIAGNOSIS — E1142 Type 2 diabetes mellitus with diabetic polyneuropathy: Secondary | ICD-10-CM | POA: Diagnosis not present

## 2015-01-27 DIAGNOSIS — Z794 Long term (current) use of insulin: Secondary | ICD-10-CM | POA: Diagnosis not present

## 2015-01-27 DIAGNOSIS — E039 Hypothyroidism, unspecified: Secondary | ICD-10-CM | POA: Diagnosis not present

## 2015-01-27 DIAGNOSIS — R079 Chest pain, unspecified: Secondary | ICD-10-CM | POA: Diagnosis not present

## 2015-01-27 DIAGNOSIS — Z0000 Encounter for general adult medical examination without abnormal findings: Secondary | ICD-10-CM | POA: Diagnosis not present

## 2015-01-27 DIAGNOSIS — I2510 Atherosclerotic heart disease of native coronary artery without angina pectoris: Secondary | ICD-10-CM | POA: Diagnosis not present

## 2015-01-27 MED ORDER — NITROGLYCERIN 0.4 MG SL SUBL
0.8000 mg | SUBLINGUAL_TABLET | SUBLINGUAL | Status: DC | PRN
Start: 2015-01-27 — End: 2015-01-28
  Administered 2015-01-27: 0.8 mg via SUBLINGUAL
  Filled 2015-01-27: qty 25

## 2015-01-27 MED ORDER — IOHEXOL 350 MG/ML SOLN
80.0000 mL | Freq: Once | INTRAVENOUS | Status: AC | PRN
  Administered 2015-01-27: 80 mL via INTRAVENOUS

## 2015-01-27 MED ORDER — NITROGLYCERIN 0.4 MG SL SUBL
SUBLINGUAL_TABLET | SUBLINGUAL | Status: AC
  Administered 2015-01-27: 0.8 mg via SUBLINGUAL
  Filled 2015-01-27: qty 2

## 2015-01-30 ENCOUNTER — Telehealth: Payer: Self-pay | Admitting: *Deleted

## 2015-01-30 MED ORDER — METOPROLOL TARTRATE 25 MG PO TABS: 12.5000 mg | ORAL_TABLET | Freq: Two times a day (BID) | ORAL | Status: DC

## 2015-01-30 NOTE — Telephone Encounter (Signed)
-----   Message from Skeet Latch, MD sent at 01/30/2015  1:26 PM EST ----- CT scan showed no coronary artery disease.  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   The treatment is to reduce how hard the heart is squeezing with beta blockers.  Stop imdur and start metoprolol 12.5mg  bid.  Schedule for 1 month follow up to assess symptoms.

## 2015-01-30 NOTE — Telephone Encounter (Signed)
Spoke to patient. Result given . Verbalized understanding AWARE STOP IMDUR  START METOPROLOL TART. 12.5 MG TWICE  A DAY APPOINTMENT SCHEDULE FOR 03/14/14 3:30 PM

## 2015-02-22 DIAGNOSIS — R69 Illness, unspecified: Secondary | ICD-10-CM | POA: Diagnosis not present

## 2015-03-13 DIAGNOSIS — R69 Illness, unspecified: Secondary | ICD-10-CM | POA: Diagnosis not present

## 2015-03-15 ENCOUNTER — Ambulatory Visit (INDEPENDENT_AMBULATORY_CARE_PROVIDER_SITE_OTHER): Payer: Medicare HMO | Admitting: Cardiovascular Disease

## 2015-03-15 ENCOUNTER — Encounter: Payer: Self-pay | Admitting: Cardiovascular Disease

## 2015-03-15 DIAGNOSIS — R55 Syncope and collapse: Secondary | ICD-10-CM | POA: Diagnosis not present

## 2015-03-15 DIAGNOSIS — E785 Hyperlipidemia, unspecified: Secondary | ICD-10-CM

## 2015-03-15 DIAGNOSIS — R001 Bradycardia, unspecified: Secondary | ICD-10-CM

## 2015-03-15 DIAGNOSIS — Q245 Malformation of coronary vessels: Secondary | ICD-10-CM | POA: Diagnosis not present

## 2015-03-15 NOTE — Patient Instructions (Signed)
Dr Oval Linsey has made no changes in your current medications or treatment plan.  Your physician recommends that you schedule a follow-up appointment in 6 months. You will receive a reminder letter in the mail two months in advance. If you don't receive a letter, please call our office to schedule the follow-up appointment.  If you need a refill on your cardiac medications before your next appointment, please call your pharmacy.

## 2015-03-15 NOTE — Progress Notes (Signed)
Cardiology Office Note   Date:  03/16/2015  ID:  Charles Strong, DOB 1940-07-25, MRN [id number]  PCP:  Lottie Dawson, MD  Cardiologist:   Sharol Harness, MD   Chief Complaint  Patient presents with  . Follow-up    1 month med change//pt c/o chest pain--2 episodes--stinging feeling on left side under axilla, lasted 2-3 hours; other episode in November--having trouble with left arm muscle--had sharp pain in left shoulder blade at 2am, felt weak, fell on the floor, sweating, nausea, --took aspirin and nitro, pain subsided after 1 hr.      Patient ID: Charles Strong is a 75 y.o. male with diabetes, type 2, hyperlipidemia, and Hashimotos's thyroiditis who presents for follow up on medically managed CAD.  Interval history 03/15/15: After his last appointment Charles Strong underwent cardiac CT angiography to better determine the severity of his CAD, as he was not interested in undergoing cardiac catheterization.  This study revealed a coronary calcium score of 0 and minimal, non-calcified plaque.  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  Imdur was discontinued and he was started on metoprolol.  Charles Strong has been doing fairly well.  He had an episode of tingling around his heart.  He felt like pins sticking in his chest for 1-2 hours.  It happened one time just after starting metoprolol in December and has not recurred.  In late November he had pain in his left shoulder.  It occuredat 2 am and was bad enough to awaken him from sleeping.  He took nitroglycerin and aspirin without relief.  He walked to the kitchen and collapsed before he could get there.  He felt a hot flash, nausea and profuse perspiration.  His wife found him and he took another nitroglycerin and his pain improved.  That evening he had used Cialis and he was on isordil at the time.  Charles Strong has not been exercising as usual.  He typically likes to run (barefoot) for  5 miles per day.  However, he has not been doing this since he first had chest pain.  He wonders if he can resume exercise.  He denies any chest pain or shortness of breath with exertion.  He also has not noted any lower extremity edema, orthopnea or PND.   Interval history 01/06/15: Charles Strong had an abnrmal stress test and declined cath.  He was found to have ST depression in the inferior leads that occurred 10 minutes into exercise. He prefers to try medical management.  He started Imdur 30 and continues to have episodes of chest discomfort. He has cut back his running from 5 miles 7 days a week to 1 mile 3 days per week. This is partially due to chest discomfort and partially due to fear of worsening his heart disease. Charles Strong also has left arm pain and reports an electrical shooting pain that goes down his arm with movement. He is noted less arm pain since starting Imdur.    Charles Strong has started taking several over-the-counter supplements that are supposed to be good for cardiovascular health. He is also changed his diet by eliminating carbohydrates and increasing his intake of vegetables.  His blood pressure at home has been typically in the 100-145/5-60s.   He has reduced his exercise.  History of Present Illness Charles Strong reports an isolated episode of chest tightness that occurred while running three months ago.  The episode lasted for  5-6 minutes and was mild in severity.  It was not associated with shortness of breath, palpitations, lightheadedness, dizziness, nausea or vomiting.  He runs for five miles daily and has not had any recurrent episodes.  He mentioned the episode to his PCP, [name] on 09/05/14 and was referred to cardiology for further evaluation.  Dr. Buddy Duty has been recommending a statin, but Charles Strong declines.  He is afraid of the things he has heard about statins and prefers to take supplements, including red yeast rice, instead.  Charles Strong  denies orthopnea, PND or LE edema.  In addition to daily exercise he eats a healthy diet and has lost 7 lb in the last 6 months.  This was unintentional, but he attributes this to not eating as much or drinking any alcohol now that he does not have frequent dinners with his daughter.    Past Medical History  Diagnosis Date  . Diabetes mellitus without complication ([place])   . Thyroid disease   . Chest pain 11/02/2014  . Diabetes type 2, controlled ([place]) 11/02/2014  . Abnormal stress ECG 01/09/2015  . 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 Tonsillectomy       Current Outpatient Prescriptions  Medication Sig Dispense Refill  . Acetylcysteine (NAC 600 PO) Take 1 tablet by mouth 2 (two) times daily.    . Alpha-Lipoic Acid (LIPOIC ACID PO) Take 100 mg by mouth daily.     . APPLE CIDER VINEGAR PO Take 250 mg by mouth daily.    . Arginine 1000 MG TABS Take 1 tablet by mouth 2 (two) times daily.    . Ascorbic Acid (VITAMIN C) 1000 MG tablet Take 1,000 mg by mouth daily.    Marland Kitchen aspirin 81 MG tablet Take 81 mg by mouth daily.    . Cholecalciferol (VITAMIN D3) 5000 UNITS CAPS Take by mouth once a week.    Marland Kitchen CITRULLINE 1000 PO Take 1 tablet by mouth daily.    . Coconut Oil 1000 MG CAPS Take 1 capsule by mouth 2 (two) times daily.    . Coenzyme Q10 (CO Q 10 PO) Take 200 mg by mouth daily.    . Digestive Enzymes (ENZYME DIGEST PO) Take by mouth daily.    Marland Kitchen GARLIC PO Take by mouth. TAKES 3 CAPSULES THREE TIMES DAILY    . Ginger, Zingiber officinalis, (GINGER ROOT) 550 MG CAPS Take 1 capsule by mouth daily.    Marland Kitchen glipiZIDE (GLUCOTROL XL) 5 MG 24 hr tablet Take 5 mg by mouth as needed (DIABETES).   2  . Hyaluronic Acid-Vitamin C (HYALURONIC ACID PO) Take 50 mg by mouth daily.    . Lecithin 1200 MG CAPS Take 1 capsule by mouth daily.    Marland Kitchen LEVOTHYROXINE SODIUM PO Take 75 mg by mouth daily.     Marland Kitchen liothyronine (CYTOMEL) 5 MCG tablet Take 5 mcg by mouth daily.    .  Magnesium 250 MG TABS Take 1 tablet by mouth 4 (four) times daily.    . metFORMIN (GLUCOPHAGE-XR) 500 MG 24 hr tablet Take 500 mg by mouth 4 (four) times daily.  4  . metoprolol tartrate (LOPRESSOR) 25 MG tablet Take 0.5 tablets (12.5 mg total) by mouth 2 (two) times daily. 15 tablet 6  . Multiple Vitamins-Minerals (ONE-A-DAY ENERGY) TABS Take by mouth daily.    . Nattokinase 100 MG CAPS Take 100 mg by mouth daily.    . nitroGLYCERIN (NITROSTAT) 0.4  MG SL tablet Place 1 tablet (0.4 mg total) under the tongue every 5 (five) minutes as needed for chest pain. 25 tablet 6  . Omega-3 Fatty Acids (FISH OIL) 1000 MG CAPS Take by mouth daily.    Glory Rosebush VERIO test strip CHECK BLOOD SUGARS TID  5  . Potassium 99 MG TABS Take 4 tablets by mouth daily.    . Red Yeast Rice Extract (RED YEAST RICE PO) Take 1,200 mg by mouth 3 (three) times daily. TAKES THREE TABLETS WITH EACH MEAL.    Marland Kitchen Specialty Vitamins Products (PROSTATE PO) Take by mouth daily.    Marland Kitchen TAURINE PO Take by mouth daily.    Marland Kitchen Ubiquinol Liposomal (QH PO) Take 100 mg by mouth.    . Vitamins A & D (VITAMIN A & D) 10000-400 UNITS CAPS Take by mouth daily.    . Vitamins-Lipotropics (B-100 PO) Take by mouth daily.    . WHEY PROTEIN PO Take by mouth daily.    . Zinc 25 MG TABS Take 25 mg by mouth daily.     No current facility-administered medications for this visit.    Allergies:   Calcium fortified cookie    Social History:  The patient  reports that he has never smoked. He has never used smokeless tobacco. He reports that he does not drink alcohol or use illicit drugs.   Family History:  The patient's family history is not on file.    ROS:  Please see the history of present illness.  Otherwise, review of systems are positive for none.   All other systems are reviewed and negative.    PHYSICAL EXAM: VS:  BP 124/62 mmHg  Pulse 51  Ht 5\' 10"  (1.778 m)  Wt 65.363 kg (144 lb 1.6 oz)  BMI 20.68 kg/m2 , BMI Body mass index is 20.68  kg/(m^2). GENERAL:  Well appearing HEENT:  Pupils equal round and reactive, fundi not visualized, oral mucosa unremarkable NECK:  No jugular venous distention, waveform within normal limits, carotid upstroke brisk and symmetric, no bruits,  LYMPHATICS:  No cervical adenopathy LUNGS:  Clear to auscultation bilaterally HEART:  RRR.  PMI not displaced or sustained,S1 and S2 within normal limits, no S3, no S4, no clicks, no rubs, no murmurs ABD:  Flat, positive bowel sounds normal in frequency in pitch, no bruits, no rebound, no guarding, no midline pulsatile mass, no hepatomegaly, no splenomegaly EXT:  2 plus pulses throughout, no edema, no cyanosis no clubbing SKIN:  No rashes no nodules NEURO:  Cranial nerves II through XII grossly intact, motor grossly intact throughout PSYCH:  Cognitively intact, oriented to person place and time   EKG:  EKG is  ordered today. The ekg ordered today demonstrates sinus bradycardia at 51 bpm.  11/30/14 ETT:  Blood pressure demonstrated a hypertensive response to exercise.  Horizontal ST segment depression ST segment depression was noted during stress in the II, III and aVF leads, beginning at 10 minutes of stress, and returning to baseline after less than 1 minute of recovery.    Recent Labs: 01/10/2015: BUN 16; Creat 0.80; Potassium 4.5; Sodium 141    Lipid Panel    Component Value Date/Time   CHOL 163 12/19/2014 0913   TRIG 73 12/19/2014 0913   HDL 63 12/19/2014 0913   CHOLHDL 2.6 12/19/2014 0913   VLDL 15 12/19/2014 0913   LDLCALC 85 12/19/2014 0913     Hgb A1c 6.6% Na 142, K 4.3, BUN 12, Creatinine 0.91 AST 22, ALT  24   Wt Readings from Last 3 Encounters:  03/15/15 65.363 kg (144 lb 1.6 oz)  01/06/15 67.586 kg (149 lb)  11/02/14 67.132 kg (148 lb)    Cardiac CT angiography 01/27/15: IMPRESSION: 1. Coronary calcium score of 0. This was 0 percentile for age and sex matched control. 2. Normal coronary origin. Right dominance. 3.  Minimal non-calcified plaque. 4. 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 This is most probably culprit of patient's symptoms  Other studies Reviewed: Additional studies/ records that were reviewed today include: n/a. Review of the above records demonstrates:  Please see elsewhere in the note.  n/a   ASSESSMENT AND PLAN:  # Chest pain, myocardial bridge:  Charles Strong chest pain is quite possibly coming from the myocardial bridge rather than CAD, given his calcium score of 0.  Isordil was discontinued and he was started on metoprolol. He has not had any recurrent chest pain since that time.  He was encouraged to slowly, increase his exercise as tolerated.  Continue aspirin.  # Hyperlipidemia:  ASCVD 10 year risk is 29.3%.  Charles Strong remains on interested in starting a statin and prefers to continue red yeast Rice.  Given his calcium score of 0, and an LDL of 85, he seems to be doing something right, and I will not aggressively seek to change his mind.  # Syncope: Likely due to using nitrates and Cialis.  Isordil has been discontinued and we discussed not using Cialis and sublingual nitroglycerin at the same time.  # Bradycardia: Asymptomatic.  Will continue to monitor on metoprolol.  Current medicines are reviewed at length with the patient today.  The patient does not have concerns regarding medicines.  The following changes have been made:  no change  Labs/ tests ordered today include:   No orders of the defined types were placed in this encounter.     Disposition:   FU with Dr. Jonelle Sidle C. Amaya in 6 months.   Signed, Sharol Harness, MD  03/16/2015 6:09 AM    [place]

## 2015-03-16 ENCOUNTER — Encounter: Payer: Self-pay | Admitting: Cardiovascular Disease

## 2015-03-16 DIAGNOSIS — Q245 Malformation of coronary vessels: Secondary | ICD-10-CM | POA: Insufficient documentation

## 2015-03-16 HISTORY — DX: Malformation of coronary vessels: Q24.5

## 2015-03-21 DIAGNOSIS — R69 Illness, unspecified: Secondary | ICD-10-CM | POA: Diagnosis not present

## 2015-03-26 DIAGNOSIS — R69 Illness, unspecified: Secondary | ICD-10-CM | POA: Diagnosis not present

## 2015-04-05 ENCOUNTER — Ambulatory Visit (INDEPENDENT_AMBULATORY_CARE_PROVIDER_SITE_OTHER): Payer: Self-pay | Admitting: Family Medicine

## 2015-04-05 VITALS — BP 106/60 | HR 70 | Temp 97.6°F | Resp 16 | Ht 70.5 in | Wt 146.0 lb

## 2015-04-05 DIAGNOSIS — Z021 Encounter for pre-employment examination: Secondary | ICD-10-CM

## 2015-04-05 DIAGNOSIS — Z0000 Encounter for general adult medical examination without abnormal findings: Secondary | ICD-10-CM

## 2015-04-05 NOTE — Progress Notes (Addendum)
[facility] [address], [place] [address] [phone]  Date:  04/05/2015   Name:  Charles Strong   DOB:  January 21, 1941   MRN:  [id number]  PCP:  Lottie Dawson, MD    Chief Complaint: DOT PE   History of Present Illness:  Charles Strong is a 75 y.o. very pleasant male patient who presents with the following:  Here today seeking a DOT PE. 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  He has a history of DM controlled with diet and metformin, chest pain, abnormal stress test last year.  Following the stress he declined cath but did a cardiac CT angiography and was found to have minimal plaque His last cardiology eval was last month- he was noted to have a myocardial bridge but no CAD, as his coronary calcium score was 0.  He was changedfrom isordil to metoprolol  He did have a pre-syncopal episode about 4 months ago that was thouht due to over- medication resulting in hypotension. He did not actually have syncope.  He changed his medication and this has not happened again.    He has always had a low pulse due to exercise During exam had pt do 10 squats and pulse went to 70 BPM He runs a significant amount for exercise and is in very good shape for his age  His endocrinologist watches his DM- most recent A1c was 6.3  Pulse Readings from Last 3 Encounters:  04/05/15 49  03/15/15 51  01/27/15 54   BP Readings from Last 3 Encounters:  04/05/15 106/60  03/15/15 124/62  01/27/15 124/75    Patient Active Problem List   Diagnosis Date Noted  . 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 Abnormal stress ECG 01/09/2015  . Chest pain 11/02/2014  . Hyperlipidemia 11/02/2014  . Diabetes type 2, controlled ([place]) 11/02/2014    Past Medical History  Diagnosis Date  . Diabetes mellitus without complication ([place])   . Thyroid disease   . Chest pain 11/02/2014  .  Diabetes type 2, controlled ([place]) 11/02/2014  . Abnormal stress ECG 01/09/2015  . 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 Tonsillectomy      Social History  Substance Use Topics  . Smoking status: Never Smoker   . Smokeless tobacco: Never Used  . Alcohol Use: No    No family history on file.  Allergies  Allergen Reactions  . Calcium Fortified Cookie [Nutritional Supplements] Diarrhea    Medication list has been reviewed and updated.  Current Outpatient Prescriptions on File Prior to Visit  Medication Sig Dispense Refill  . APPLE CIDER VINEGAR PO Take 250 mg by mouth daily.    . Arginine 1000 MG TABS Take 1 tablet by mouth 2 (two) times daily.    . Ascorbic Acid (VITAMIN C) 1000 MG tablet Take 1,000 mg by mouth daily.    . Cholecalciferol (VITAMIN D3) 5000 UNITS CAPS Take by mouth once a week.    Marland Kitchen CITRULLINE 1000 PO Take 1 tablet by mouth daily.    . Coconut Oil 1000 MG CAPS Take 1 capsule by mouth 2 (two) times daily.    . Coenzyme Q10 (CO Q 10 PO) Take 200 mg by mouth daily.    Marland Kitchen GARLIC PO Take by mouth. TAKES 3 CAPSULES THREE TIMES DAILY    . Ginger, Zingiber  officinalis, (GINGER ROOT) 550 MG CAPS Take 1 capsule by mouth daily.    Marland Kitchen Hyaluronic Acid-Vitamin C (HYALURONIC ACID PO) Take 50 mg by mouth daily.    . Magnesium 250 MG TABS Take 1 tablet by mouth 4 (four) times daily.    . metFORMIN (GLUCOPHAGE-XR) 500 MG 24 hr tablet Take 500 mg by mouth 4 (four) times daily.  4  . Nattokinase 100 MG CAPS Take 100 mg by mouth daily.    . nitroGLYCERIN (NITROSTAT) 0.4 MG SL tablet Place 1 tablet (0.4 mg total) under the tongue every 5 (five) minutes as needed for chest pain. 25 tablet 6  . Omega-3 Fatty Acids (FISH OIL) 1000 MG CAPS Take by mouth daily.    . Potassium 99 MG TABS Take 4 tablets by mouth daily.    Marland Kitchen Specialty Vitamins Products (PROSTATE PO) Take by mouth daily.    Marland Kitchen TAURINE PO Take by mouth daily.    Marland Kitchen Ubiquinol Liposomal  (QH PO) Take 100 mg by mouth.    . Vitamins A & D (VITAMIN A & D) 10000-400 UNITS CAPS Take by mouth daily.    . Vitamins-Lipotropics (B-100 PO) Take by mouth daily.    . Zinc 25 MG TABS Take 25 mg by mouth daily.    . Acetylcysteine (NAC 600 PO) Take 1 tablet by mouth 2 (two) times daily. Reported on 04/05/2015    . Alpha-Lipoic Acid (LIPOIC ACID PO) Take 100 mg by mouth daily. Reported on 04/05/2015    . aspirin 81 MG tablet Take 81 mg by mouth daily. Reported on 04/05/2015    . Digestive Enzymes (ENZYME DIGEST PO) Take by mouth daily. Reported on 04/05/2015    . glipiZIDE (GLUCOTROL XL) 5 MG 24 hr tablet Take 5 mg by mouth as needed (DIABETES). Reported on 04/05/2015  2  . Lecithin 1200 MG CAPS Take 1 capsule by mouth daily. Reported on 04/05/2015    . LEVOTHYROXINE SODIUM PO Take 75 mg by mouth daily. Reported on 04/05/2015    . liothyronine (CYTOMEL) 5 MCG tablet Take 5 mcg by mouth daily. Reported on 04/05/2015    . metoprolol tartrate (LOPRESSOR) 25 MG tablet Take 0.5 tablets (12.5 mg total) by mouth 2 (two) times daily. (Patient not taking: Reported on 04/05/2015) 15 tablet 6  . Multiple Vitamins-Minerals (ONE-A-DAY ENERGY) TABS Take by mouth daily. Reported on 04/05/2015    . ONETOUCH VERIO test strip CHECK BLOOD SUGARS TID  5  . Red Yeast Rice Extract (RED YEAST RICE PO) Take 1,200 mg by mouth 3 (three) times daily. Reported on 04/05/2015    . WHEY PROTEIN PO Take by mouth daily. Reported on 04/05/2015     No current facility-administered medications on file prior to visit.    Review of Systems:  As per HPI- otherwise negative.   Physical Examination: Filed Vitals:   04/05/15 0941  BP: 106/60  Pulse: 49  Temp: 97.6 F (36.4 C)  Resp: 16   Filed Vitals:   04/05/15 0941  Height: 5' 10.5" (1.791 m)  Weight: 146 lb (66.225 kg)   Body mass index is 20.65 kg/(m^2). Ideal Body Weight: Weight in (lb) to have BMI = 25: 176.4  GEN: WDWN, NAD, Non-toxic, A & O x 3 HEENT: Atraumatic, Normocephalic.  Neck supple. No masses, No LAD.  Bilateral TM wnl, oropharynx normal.  PEERL,EOMI.   Ears and Nose: No external deformity. CV: RRR, No M/G/R. No JVD. No thrill. No extra heart sounds. PULM: CTA B,  no wheezes, crackles, rhonchi. No retractions. No resp. distress. No accessory muscle use. ABD: S, NT, ND, +BS. No rebound. No HSM. EXTR: No c/c/e NEURO Normal gait.  PSYCH: Normally interactive. Conversant. Not depressed or anxious appearing.  Calm demeanor.  No inguinal hernia Normal strength and DTR of all extremities   Assessment and Plan: Physical exam  DOT exam today. History of controlled DM.  Also history of chest pain with abnormal stress but normal coronaries on cardiac CT.  Also pre-syncope a few months ago due to over medication and hypotension.  I felt comfortable giving him a one year card but do want to touch base with his cardiologist to make sure that I fully understand his condition as he did not have a cath but a coronary CT.  His primary cardiologist is out of the office today but I will plan to contact her this week  Signed Lamar Blinks, MD  Received a note back from his cardiologist 2/10 Hi Dr. Edilia Bo,   I agree, he does not have any coronary disease. His cardiac CT was entirely clean. I think that his syncope occurred from taking ED meds and sublingual nitroglycerin. 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   Best,  Jonelle Sidle

## 2015-04-05 NOTE — Patient Instructions (Signed)
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  Take care!

## 2015-04-11 DIAGNOSIS — R69 Illness, unspecified: Secondary | ICD-10-CM | POA: Diagnosis not present

## 2015-04-24 DIAGNOSIS — E063 Autoimmune thyroiditis: Secondary | ICD-10-CM | POA: Diagnosis not present

## 2015-04-24 DIAGNOSIS — E1142 Type 2 diabetes mellitus with diabetic polyneuropathy: Secondary | ICD-10-CM | POA: Diagnosis not present

## 2015-04-24 DIAGNOSIS — E039 Hypothyroidism, unspecified: Secondary | ICD-10-CM | POA: Diagnosis not present

## 2015-04-24 DIAGNOSIS — Z7984 Long term (current) use of oral hypoglycemic drugs: Secondary | ICD-10-CM | POA: Diagnosis not present

## 2015-04-27 DIAGNOSIS — R69 Illness, unspecified: Secondary | ICD-10-CM | POA: Diagnosis not present

## 2015-05-27 DIAGNOSIS — R69 Illness, unspecified: Secondary | ICD-10-CM | POA: Diagnosis not present

## 2015-06-21 DIAGNOSIS — R69 Illness, unspecified: Secondary | ICD-10-CM | POA: Diagnosis not present

## 2015-06-24 DIAGNOSIS — E039 Hypothyroidism, unspecified: Secondary | ICD-10-CM | POA: Diagnosis not present

## 2015-06-24 DIAGNOSIS — I10 Essential (primary) hypertension: Secondary | ICD-10-CM | POA: Diagnosis not present

## 2015-06-24 DIAGNOSIS — Z0000 Encounter for general adult medical examination without abnormal findings: Secondary | ICD-10-CM | POA: Diagnosis not present

## 2015-06-24 DIAGNOSIS — E119 Type 2 diabetes mellitus without complications: Secondary | ICD-10-CM | POA: Diagnosis not present

## 2015-07-03 ENCOUNTER — Other Ambulatory Visit: Payer: Self-pay

## 2015-07-04 ENCOUNTER — Telehealth: Payer: Self-pay | Admitting: Cardiovascular Disease

## 2015-07-04 NOTE — Telephone Encounter (Signed)
°*  STAT* If patient is at the pharmacy, call can be transferred to refill team.   1. Which medications need to be refilled? (please list name of each medication and dose if known) Metoprolol Tartrate 25 mg ( 1/2 tab BID)  2. Which pharmacy/location (including street and city if local pharmacy) is medication to be sent to?[facility] on [address]   3. Do they need a 30 day or 90 day supply? [place]

## 2015-07-04 NOTE — Telephone Encounter (Signed)
Left message to call back  

## 2015-07-04 NOTE — Telephone Encounter (Signed)
When I last saw him he was still taking metoprolol.  Unless another prescriber stopped it, I am OK with him getting a refill.

## 2015-07-05 MED ORDER — METOPROLOL TARTRATE 25 MG PO TABS: ORAL_TABLET | ORAL | Status: DC

## 2015-07-05 NOTE — Telephone Encounter (Signed)
Confirmed with patient he has been taking the Metoprolol and no other physician d/c Metoprolol

## 2015-07-27 ENCOUNTER — Ambulatory Visit (INDEPENDENT_AMBULATORY_CARE_PROVIDER_SITE_OTHER): Payer: Medicare HMO | Admitting: Cardiovascular Disease

## 2015-07-27 ENCOUNTER — Encounter: Payer: Self-pay | Admitting: Cardiovascular Disease

## 2015-07-27 VITALS — BP 104/62 | HR 54 | Ht 70.0 in | Wt 149.0 lb

## 2015-07-27 DIAGNOSIS — I2510 Atherosclerotic heart disease of native coronary artery without angina pectoris: Secondary | ICD-10-CM | POA: Diagnosis not present

## 2015-07-27 DIAGNOSIS — I10 Essential (primary) hypertension: Secondary | ICD-10-CM

## 2015-07-27 DIAGNOSIS — E785 Hyperlipidemia, unspecified: Secondary | ICD-10-CM

## 2015-07-27 DIAGNOSIS — Q245 Malformation of coronary vessels: Secondary | ICD-10-CM | POA: Diagnosis not present

## 2015-07-27 NOTE — Patient Instructions (Addendum)

## 2015-07-27 NOTE — Progress Notes (Signed)
Cardiology Office Note   Date:  07/27/2015  ID:  DEBBIE KLUNK, DOB [birth year], MRN [id number]  PCP:  Lottie Dawson, MD  Cardiologist:   Skeet Latch, MD   No chief complaint on file.    Patient ID: Charles Strong is a 75 y.o. male with diabetes, type 2, hyperlipidemia, and Hashimotos's thyroiditis who presents for follow up on medically managed CAD.  History of Present Illness:  Charles Strong was initially seen 11/02/15 after an isolated episode of chest tightness that occurred while running. He runs 5 miles daily. He was referred for an exercise tolerance test on 11/30/14 that was concerning for ischemia. He exercised for 10 minutes on a Bruce protocol, which is 11.8 METs.  He subsequently declined cardiac catheterization and therefore had a cardiac CT angiography on 01/2015.  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 He was started on metoprolol. Charles Strong also had an episode of syncope. However, this was thought to be due to taking both Imdur and Cialis at the same time.  In the past his PCP recommended starting a statin. However he declines and prefers to take red yeast rice instead.  Since his last appointment Charles Strong has been doing very well. He has been running almost every day without symptoms. He has not noted any chest pain, shortness of breath, palpitations, lightheadedness or dizziness. He also denies any lower extremity edema, orthopnea or PND.  He continues to maintain a healthy diet as well.  Past Medical History  Diagnosis Date  . Diabetes mellitus without complication ([place])   . Thyroid disease   . Chest pain 11/02/2014  . Diabetes type 2, controlled ([place]) 11/02/2014  . Abnormal stress ECG 01/09/2015  . 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 Tonsillectomy       Current Outpatient Prescriptions  Medication  Sig Dispense Refill  . Acetylcysteine (NAC 600 PO) Take 1 tablet by mouth 2 (two) times daily. Reported on 04/05/2015    . Alpha-Lipoic Acid (LIPOIC ACID PO) Take 100 mg by mouth daily. Reported on 04/05/2015    . APPLE CIDER VINEGAR PO Take 250 mg by mouth daily.    . Coenzyme Q10 (CO Q 10 PO) Take 200 mg by mouth daily.    . Garlic [id number] MG TABS Take 1 tablet by mouth daily.    Marland Kitchen glipiZIDE (GLUCOTROL XL) 5 MG 24 hr tablet Take 5 mg by mouth as needed (DIABETES). Reported on 04/05/2015  2  . glucose blood test strip 1 each by Other route 3 (three) times daily. Use as instructed    . Hyaluronic Acid-Vitamin C (HYALURONIC ACID PO) Take 50 mg by mouth daily.    . Lecithin 1200 MG CAPS Take 1 capsule by mouth daily. Reported on 04/05/2015    . LEVOTHYROXINE SODIUM PO Take 75 mg by mouth daily. Reported on 04/05/2015    . liothyronine (CYTOMEL) 5 MCG tablet Take 5 mcg by mouth daily. Reported on 04/05/2015    . Magnesium 250 MG TABS Take 1 tablet by mouth 4 (four) times daily.    . Menaquinone-7 (VITAMIN K2 PO) Take 1,200 mcg by mouth daily.    . metFORMIN (GLUCOPHAGE-XR) 500 MG 24 hr tablet Take 500 mg by mouth 4 (four) times daily.  4  . metoprolol tartrate (LOPRESSOR) 25 MG tablet 1/2 tablet by mouth  twice a day 90 tablet 3  . NON FORMULARY Take 600 mg by mouth daily. [place]    . NON FORMULARY Take 250 mg by mouth daily. Stinging Nettle Root Extract    . Omega-3 Fatty Acids (FISH OIL) 1000 MG CAPS Take by mouth daily.    . Potassium 99 MG TABS Take 4 tablets by mouth daily.    . Selenium 200 MCG TABS Take 200 mcg by mouth daily.    Marland Kitchen Specialty Vitamins Products (PROSTATE PO) Take by mouth daily.    Marland Kitchen TAURINE PO Take by mouth daily.    Marland Kitchen thiamine (VITAMIN B-1) 100 MG tablet Take 100 mg by mouth daily.    . TURMERIC PO Take 400 mg by mouth daily.    . Zinc 25 MG TABS Take 25 mg by mouth daily.     No current facility-administered medications for this visit.    Allergies:   Calcium fortified cookie     Social History:  The patient  reports that he has never smoked. He has never used smokeless tobacco. He reports that he does not drink alcohol or use illicit drugs.   Family History:  The patient's family history is not on file.    ROS:  Please see the history of present illness.  Otherwise, review of systems are positive for none.   All other systems are reviewed and negative.    PHYSICAL EXAM: VS:  BP 104/62 mmHg  Pulse 54  Ht 5\' 10"  (1.778 m)  Wt 149 lb (67.586 kg)  BMI 21.38 kg/m2 , BMI Body mass index is 21.38 kg/(m^2). GENERAL:  Well appearing HEENT:  Pupils equal round and reactive, fundi not visualized, oral mucosa unremarkable NECK:  No jugular venous distention, waveform within normal limits, carotid upstroke brisk and symmetric, no bruits,  LYMPHATICS:  No cervical adenopathy LUNGS:  Clear to auscultation bilaterally HEART:  RRR.  PMI not displaced or sustained,S1 and S2 within normal limits, no S3, no S4, no clicks, no rubs, no murmurs ABD:  Flat, positive bowel sounds normal in frequency in pitch, no bruits, no rebound, no guarding, no midline pulsatile mass, no hepatomegaly, no splenomegaly EXT:  2 plus pulses throughout, no edema, no cyanosis no clubbing SKIN:  No rashes no nodules NEURO:  Cranial nerves II through XII grossly intact, motor grossly intact throughout PSYCH:  Cognitively intact, oriented to person place and time   EKG:  EKG is  ordered today. The ekg ordered today demonstrates sinus bradycardia at 54 bpm.  11/30/14 ETT:  Blood pressure demonstrated a hypertensive response to exercise.  Horizontal ST segment depression ST segment depression was noted during stress in the II, III and aVF leads, beginning at 10 minutes of stress, and returning to baseline after less than 1 minute of recovery.  Recent Labs: 01/10/2015: BUN 16; Creat 0.80; Potassium 4.5; Sodium 141    Lipid Panel    Component Value Date/Time   CHOL 163 12/19/2014 0913   TRIG  73 12/19/2014 0913   HDL 63 12/19/2014 0913   CHOLHDL 2.6 12/19/2014 0913   VLDL 15 12/19/2014 0913   LDLCALC 85 12/19/2014 0913     Hgb A1c 6.6% Na 142, K 4.3, BUN 12, Creatinine 0.91 AST 22, ALT 24   Wt Readings from Last 3 Encounters:  07/27/15 149 lb (67.586 kg)  04/05/15 146 lb (66.225 kg)  03/15/15 144 lb 1.6 oz (65.363 kg)    Cardiac CT angiography 01/27/15: IMPRESSION: 1. Coronary calcium score of 0.  This was 0 percentile for age and sex matched control. 2. Normal coronary origin. Right dominance. 3. Minimal non-calcified plaque. 4. 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 This is most probably culprit of patient's symptoms  Other studies Reviewed: Additional studies/ records that were reviewed today include: n/a. Review of the above records demonstrates:  Please see elsewhere in the note.  n/a   ASSESSMENT AND PLAN:  # Chest pain, myocardial bridge:  Charles Strong Is no longer experiencing chest pain since he was started on metoprolol. He has been able to resume his exercise routine without symptoms. Continue metoprolol.  # Hyperlipidemia:  ASCVD 10 year risk is 29.3%.  Charles Strong remains on interested in starting a statin and prefers to continue red yeast Rice.  Given his calcium score of 0, and an LDL of 85, he seems to be doing something right, and I will not aggressively seek to change his mind.  # Bradycardia: Asymptomatic. Continue metoprolol.  Current medicines are reviewed at length with the patient today.  The patient does not have concerns regarding medicines.  The following changes have been made:  no change  Labs/ tests ordered today include:   No orders of the defined types were placed in this encounter.   Time spent: 15 minutes-Greater than 50% of this time was spent in counseling, explanation of diagnosis, planning of further management, and coordination of care.   Disposition:   FU with Dr. Jonelle Sidle C. Oval Linsey in 1  year.   Signed, Skeet Latch, MD  07/27/2015 5:01 PM    [place]

## 2015-08-23 DIAGNOSIS — R69 Illness, unspecified: Secondary | ICD-10-CM | POA: Diagnosis not present

## 2015-08-25 DIAGNOSIS — R69 Illness, unspecified: Secondary | ICD-10-CM | POA: Diagnosis not present

## 2015-09-28 DIAGNOSIS — R69 Illness, unspecified: Secondary | ICD-10-CM | POA: Diagnosis not present

## 2015-12-05 DIAGNOSIS — H5203 Hypermetropia, bilateral: Secondary | ICD-10-CM | POA: Diagnosis not present

## 2015-12-05 DIAGNOSIS — H52223 Regular astigmatism, bilateral: Secondary | ICD-10-CM | POA: Diagnosis not present

## 2015-12-05 DIAGNOSIS — E119 Type 2 diabetes mellitus without complications: Secondary | ICD-10-CM | POA: Diagnosis not present

## 2015-12-15 DIAGNOSIS — Z7984 Long term (current) use of oral hypoglycemic drugs: Secondary | ICD-10-CM | POA: Diagnosis not present

## 2015-12-15 DIAGNOSIS — Z79899 Other long term (current) drug therapy: Secondary | ICD-10-CM | POA: Diagnosis not present

## 2015-12-15 DIAGNOSIS — E063 Autoimmune thyroiditis: Secondary | ICD-10-CM | POA: Diagnosis not present

## 2015-12-15 DIAGNOSIS — Z5181 Encounter for therapeutic drug level monitoring: Secondary | ICD-10-CM | POA: Diagnosis not present

## 2015-12-15 DIAGNOSIS — E039 Hypothyroidism, unspecified: Secondary | ICD-10-CM | POA: Diagnosis not present

## 2015-12-15 DIAGNOSIS — E1142 Type 2 diabetes mellitus with diabetic polyneuropathy: Secondary | ICD-10-CM | POA: Diagnosis not present

## 2015-12-26 DIAGNOSIS — N289 Disorder of kidney and ureter, unspecified: Secondary | ICD-10-CM | POA: Diagnosis not present

## 2015-12-31 ENCOUNTER — Encounter (HOSPITAL_COMMUNITY): Payer: Self-pay | Admitting: Emergency Medicine

## 2015-12-31 ENCOUNTER — Emergency Department (HOSPITAL_COMMUNITY)
Admission: EM | Admit: 2015-12-31 | Discharge: 2016-01-01 | Disposition: A | Discharge status: Home / Self Care | Payer: Medicare HMO | Attending: Emergency Medicine | Admitting: Emergency Medicine

## 2015-12-31 DIAGNOSIS — Z7984 Long term (current) use of oral hypoglycemic drugs: Secondary | ICD-10-CM | POA: Diagnosis not present

## 2015-12-31 DIAGNOSIS — R339 Retention of urine, unspecified: Secondary | ICD-10-CM | POA: Diagnosis not present

## 2015-12-31 DIAGNOSIS — Z79899 Other long term (current) drug therapy: Secondary | ICD-10-CM | POA: Diagnosis not present

## 2015-12-31 DIAGNOSIS — E119 Type 2 diabetes mellitus without complications: Secondary | ICD-10-CM | POA: Insufficient documentation

## 2015-12-31 LAB — URINALYSIS, ROUTINE W REFLEX MICROSCOPIC
Bilirubin Urine: NEGATIVE
Glucose, UA: 100 mg/dL — AB
Ketones, ur: NEGATIVE mg/dL
Leukocytes, UA: NEGATIVE
Nitrite: NEGATIVE
Protein, ur: NEGATIVE mg/dL
Specific Gravity, Urine: 1.016 (ref 1.005–1.030)
pH: 6.5 (ref 5.0–8.0)

## 2015-12-31 LAB — CBC WITH DIFFERENTIAL/PLATELET
Basophils Absolute: 0 10*3/uL (ref 0.0–0.1)
Basophils Relative: 0 %
Eosinophils Absolute: 0.1 10*3/uL (ref 0.0–0.7)
Eosinophils Relative: 1 %
HCT: 38.4 % — ABNORMAL LOW (ref 39.0–52.0)
Hemoglobin: 13.3 g/dL (ref 13.0–17.0)
Lymphocytes Relative: 18 %
Lymphs Abs: 2.1 10*3/uL (ref 0.7–4.0)
MCH: 29 pg (ref 26.0–34.0)
MCHC: 34.6 g/dL (ref 30.0–36.0)
MCV: 83.7 fL (ref 78.0–100.0)
Monocytes Absolute: 1.6 10*3/uL — ABNORMAL HIGH (ref 0.1–1.0)
Monocytes Relative: 15 %
Neutro Abs: 7.3 10*3/uL (ref 1.7–7.7)
Neutrophils Relative %: 66 %
Platelets: 221 10*3/uL (ref 150–400)
RBC: 4.59 MIL/uL (ref 4.22–5.81)
RDW: 13.6 % (ref 11.5–15.5)
WBC: 11.1 10*3/uL — ABNORMAL HIGH (ref 4.0–10.5)

## 2015-12-31 LAB — BASIC METABOLIC PANEL
Anion gap: 8 (ref 5–15)
BUN: 13 mg/dL (ref 6–20)
CO2: 25 mmol/L (ref 22–32)
Calcium: 8.7 mg/dL — ABNORMAL LOW (ref 8.9–10.3)
Chloride: 102 mmol/L (ref 101–111)
Creatinine, Ser: 1.04 mg/dL (ref 0.61–1.24)
GFR calc Af Amer: >60 mL/min (ref >60)
GFR calc non Af Amer: >60 mL/min (ref >60)
Glucose, Bld: 199 mg/dL — ABNORMAL HIGH (ref 65–99)
Potassium: 3.2 mmol/L — ABNORMAL LOW (ref 3.5–5.1)
Sodium: 135 mmol/L (ref 135–145)

## 2015-12-31 LAB — URINE MICROSCOPIC-ADD ON
Bacteria, UA: NONE SEEN
Squamous Epithelial / LPF: NONE SEEN
WBC, UA: NONE SEEN WBC/hpf (ref 0–5)

## 2015-12-31 MED ORDER — TAMSULOSIN HCL 0.4 MG PO CAPS: 0.4000 mg | ORAL_CAPSULE | Freq: Every day | ORAL | 0 refills | Status: DC

## 2015-12-31 MED ORDER — TAMSULOSIN HCL 0.4 MG PO CAPS
0.4000 mg | ORAL_CAPSULE | Freq: Once | ORAL | Status: AC
  Administered 2015-12-31: 0.4 mg via ORAL
  Filled 2015-12-31: qty 1

## 2015-12-31 MED ORDER — POTASSIUM CHLORIDE CRYS ER 20 MEQ PO TBCR
30.0000 meq | EXTENDED_RELEASE_TABLET | Freq: Once | ORAL | Status: AC
  Administered 2015-12-31: 30 meq via ORAL
  Filled 2015-12-31: qty 1

## 2015-12-31 MED ORDER — LIDOCAINE HCL 2 % EX GEL
1.0000 "application " | Freq: Once | CUTANEOUS | Status: AC | PRN
  Administered 2015-12-31: 1 via URETHRAL
  Filled 2015-12-31: qty 11

## 2015-12-31 NOTE — ED Provider Notes (Signed)
[facility] DEPT Provider Note   CSN: [id number] Arrival date & time: 12/31/15  1938     History   Chief Complaint Chief Complaint  Patient presents with  . Urinary Retention    HPI Charles Strong is a 75 y.o. male with history of BPH who presents with acute urinary retention. Patient's last void was 6 AM today. Patient has since developed suprapubic pressure and left lower quadrant pain and left lower flank pain. Patient has also had 3 episodes of diarrhea. Patient normally experiences nocturnal symptoms related to his BPH and urgency during the day. Patient also began experiencing flulike symptoms yesterday and the patient had Aleve, Tylenol PM, and aspirin. Patient has history of kidney stone, last 2 years ago. Patient denies any fevers, chest pain, shortness of breath.  HPI  Past Medical History:  Diagnosis Date  . Abnormal stress ECG 01/09/2015  . Chest pain 11/02/2014  . Diabetes mellitus without complication ([place])   . Diabetes type 2, controlled ([place]) 11/02/2014  . 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 Thyroid disease     Patient Active Problem List   Diagnosis Date Noted  . 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 Abnormal stress ECG 01/09/2015  . Chest pain 11/02/2014  . Hyperlipidemia 11/02/2014  . Diabetes type 2, controlled ([place]) 11/02/2014    Past Surgical History:  Procedure Laterality Date  . TONSILLECTOMY         Home Medications    Prior to Admission medications   Medication Sig Start Date End Date Taking? Authorizing Provider  Acetylcysteine (NAC 600 PO) Take 1 tablet by mouth 2 (two) times daily. Reported on 04/05/2015    Historical Provider, MD  Alpha-Lipoic Acid (LIPOIC ACID PO) Take 100 mg by mouth daily. Reported on 04/05/2015    Historical Provider, MD  APPLE CIDER VINEGAR PO Take 250 mg by mouth daily.    Historical Provider, MD  Coenzyme Q10 (CO Q 10 PO) Take 200 mg by mouth daily.    Historical Provider, MD  Garlic [id number] MG TABS Take 1 tablet by mouth  daily.    Historical Provider, MD  glipiZIDE (GLUCOTROL XL) 5 MG 24 hr tablet Take 5 mg by mouth as needed (DIABETES). Reported on 04/05/2015 10/18/14   Historical Provider, MD  glucose blood test strip 1 each by Other route 3 (three) times daily. Use as instructed    Historical Provider, MD  Hyaluronic Acid-Vitamin C (HYALURONIC ACID PO) Take 50 mg by mouth daily.    Historical Provider, MD  Lecithin 1200 MG CAPS Take 1 capsule by mouth daily. Reported on 04/05/2015    Historical Provider, MD  LEVOTHYROXINE SODIUM PO Take 75 mg by mouth daily. Reported on 04/05/2015    Historical Provider, MD  liothyronine (CYTOMEL) 5 MCG tablet Take 5 mcg by mouth daily. Reported on 04/05/2015    Historical Provider, MD  Magnesium 250 MG TABS Take 1 tablet by mouth 4 (four) times daily.    Historical Provider, MD  Menaquinone-7 (VITAMIN K2 PO) Take 1,200 mcg by mouth daily.    Historical Provider, MD  metFORMIN (GLUCOPHAGE-XR) 500 MG 24 hr tablet Take 500 mg by mouth 4 (four) times daily. 10/21/14   Historical Provider, MD  metoprolol tartrate (LOPRESSOR) 25 MG tablet 1/2 tablet by mouth twice a day 07/05/15   Skeet Latch, MD  NON FORMULARY Take 600 mg by mouth daily. [place]    Historical Provider, MD  NON FORMULARY Take 250 mg by mouth daily. Stinging Nettle Root Extract  Historical Provider, MD  Omega-3 Fatty Acids (FISH OIL) 1000 MG CAPS Take by mouth daily.    Historical Provider, MD  Potassium 99 MG TABS Take 4 tablets by mouth daily.    Historical Provider, MD  Selenium 200 MCG TABS Take 200 mcg by mouth daily.    Historical Provider, MD  Specialty Vitamins Products (PROSTATE PO) Take by mouth daily.    Historical Provider, MD  tamsulosin (FLOMAX) 0.4 MG CAPS capsule Take 1 capsule (0.4 mg total) by mouth daily after supper. 12/31/15   Lucas Winograd M Thresea Doble, PA-C  TAURINE PO Take by mouth daily.    Historical Provider, MD  thiamine (VITAMIN B-1) 100 MG tablet Take 100 mg by mouth daily.    Historical Provider, MD    TURMERIC PO Take 400 mg by mouth daily.    Historical Provider, MD  Zinc 25 MG TABS Take 25 mg by mouth daily.    Historical Provider, MD    Family History History reviewed. No pertinent family history.  Social History Social History  Substance Use Topics  . Smoking status: Never Smoker  . Smokeless tobacco: Never Used  . Alcohol use No     Allergies   Calcium fortified cookie [nutritional supplements]   Review of Systems Review of Systems  Constitutional: Negative for chills and fever.  HENT: Negative for facial swelling and sore throat.   Respiratory: Negative for shortness of breath.   Cardiovascular: Negative for chest pain.  Gastrointestinal: Positive for abdominal pain and diarrhea. Negative for nausea and vomiting.  Genitourinary: Positive for decreased urine volume, difficulty urinating, flank pain and urgency. Negative for dysuria.  Musculoskeletal: Positive for back pain (L lower).  Skin: Negative for rash and wound.  Neurological: Negative for headaches.  Psychiatric/Behavioral: The patient is not nervous/anxious.      Physical Exam Updated Vital Signs BP 131/73 (BP Location: Right Arm)   Pulse 71   Temp 98.7 F (37.1 C) (Oral)   Resp 20   Ht 5\' 10"  (1.778 m)   Wt 68 kg   SpO2 100%   BMI 21.52 kg/m   Physical Exam  Constitutional: He appears well-developed and well-nourished. No distress.  HENT:  Head: Normocephalic and atraumatic.  Mouth/Throat: Oropharynx is clear and moist. No oropharyngeal exudate.  Eyes: Conjunctivae are normal. Pupils are equal, round, and reactive to light. Right eye exhibits no discharge. Left eye exhibits no discharge. No scleral icterus.  Neck: Normal range of motion. Neck supple. No thyromegaly present.  Cardiovascular: Normal rate, regular rhythm, normal heart sounds and intact distal pulses.  Exam reveals no gallop and no friction rub.   No murmur heard. Pulmonary/Chest: Effort normal and breath sounds normal. No  stridor. No respiratory distress. He has no wheezes. He has no rales.  Abdominal: Soft. Bowel sounds are normal. He exhibits distension (suprapubic). There is tenderness in the suprapubic area and left lower quadrant. There is no rebound, no guarding and no CVA tenderness.  Musculoskeletal: He exhibits no edema.       Lumbar back: He exhibits no bony tenderness.       Back:  Lymphadenopathy:    He has no cervical adenopathy.  Neurological: He is alert. Coordination normal.  Skin: Skin is warm and dry. No rash noted. He is not diaphoretic. No pallor.  Psychiatric: He has a normal mood and affect.  Nursing note and vitals reviewed.    ED Treatments / Results  Labs (all labs ordered are listed, but only abnormal results are  displayed) Labs Reviewed  URINALYSIS, ROUTINE W REFLEX MICROSCOPIC (NOT AT [facility]) - Abnormal; Notable for the following:       Result Value   Glucose, UA 100 (*)    Hgb urine dipstick MODERATE (*)    All other components within normal limits  BASIC METABOLIC PANEL - Abnormal; Notable for the following:    Potassium 3.2 (*)    Glucose, Bld 199 (*)    Calcium 8.7 (*)    All other components within normal limits  CBC WITH DIFFERENTIAL/PLATELET - Abnormal; Notable for the following:    WBC 11.1 (*)    HCT 38.4 (*)    Monocytes Absolute 1.6 (*)    All other components within normal limits  URINE MICROSCOPIC-ADD ON    EKG  EKG Interpretation None       Radiology No results found.  Procedures Procedures (including critical care time)  Medications Ordered in ED Medications  lidocaine (XYLOCAINE) 2 % jelly 1 application (1 application Urethral Given 12/31/15 2108)  potassium chloride (K-DUR,KLOR-CON) CR tablet 30 mEq (30 mEq Oral Given 12/31/15 2250)  tamsulosin (FLOMAX) capsule 0.4 mg (0.4 mg Oral Given 12/31/15 2250)     Initial Impression / Assessment and Plan / ED Course  I have reviewed the triage vital signs and the nursing notes.  Pertinent labs  & imaging results that were available during my care of the patient were reviewed by me and considered in my medical decision making (see chart for details).  Clinical Course     On repeat exam following catheter placement which yielded 900 mL of urine, patient is feeling much better and back to baseline. He is no longer having any left lower quadrant, suprapubic, or left flank pain or tenderness on exam.  CBC shows to be WBC 11.1, suspect possible cause of viral URI. BMP shows potassium 3.2, glucose 199, calcium 8.7. Potassium replaced in the ED. UA shows moderate hematuria, 100 glucose. Patient given first dose of Flomax in the ED. Patient discharged with Flomax and indwelling catheter with follow-up to urology in 1-2 days. Return precautions discussed. Patient understands and agrees with plan. I discussed patient case with Dr. Alvino Chapel who guided the patient's management and agrees with plan. Patient stable throughout ED course and discharged in satisfactory condition.  Final Clinical Impressions(s) / ED Diagnoses   Final diagnoses:  Urinary retention    New Prescriptions New Prescriptions   TAMSULOSIN (FLOMAX) 0.4 MG CAPS CAPSULE    Take 1 capsule (0.4 mg total) by mouth daily after supper.     Frederica Kuster, PA-C 12/31/15 2346    Davonna Belling, MD 01/01/16 0111

## 2015-12-31 NOTE — ED Triage Notes (Addendum)
Pt reports urinary retention x1 day. Pt states last void at 0600 today. Pt also reports lower abdominal pain with 3 episodes of diarrhea. Denies N/V. Hx BPH

## 2015-12-31 NOTE — ED Notes (Signed)
Attempted Foley catheter, meet resistance. [facility]) was the second person. RN made aware pt needs coude cath.

## 2015-12-31 NOTE — Discharge Instructions (Signed)
Medications: Flomax  Treatment: Take Flomax once daily after dinner before bed. Retain urinary catheter until you're seen by your urologist in the next 1-2 days.  Follow-up: Please follow-up with Dr. Gaynelle Arabian or another urologist at [facility] in 1-2 days for further evaluation and treatment of your urinary retention and removal of your catheter. Please change the emergency department if you develop any new or worsening symptoms.

## 2016-01-05 DIAGNOSIS — R338 Other retention of urine: Secondary | ICD-10-CM | POA: Diagnosis not present

## 2016-01-08 DIAGNOSIS — J209 Acute bronchitis, unspecified: Secondary | ICD-10-CM | POA: Diagnosis not present

## 2016-01-22 ENCOUNTER — Other Ambulatory Visit: Payer: Self-pay | Admitting: Cardiovascular Disease

## 2016-01-22 NOTE — Telephone Encounter (Signed)
Please review for refill. Thanks!  

## 2016-01-29 DIAGNOSIS — R338 Other retention of urine: Secondary | ICD-10-CM | POA: Diagnosis not present

## 2016-01-29 DIAGNOSIS — R339 Retention of urine, unspecified: Secondary | ICD-10-CM | POA: Diagnosis not present

## 2016-02-05 DIAGNOSIS — N401 Enlarged prostate with lower urinary tract symptoms: Secondary | ICD-10-CM | POA: Diagnosis not present

## 2016-02-05 DIAGNOSIS — R972 Elevated prostate specific antigen [PSA]: Secondary | ICD-10-CM | POA: Diagnosis not present

## 2016-02-05 DIAGNOSIS — R338 Other retention of urine: Secondary | ICD-10-CM | POA: Diagnosis not present

## 2016-03-15 ENCOUNTER — Other Ambulatory Visit: Payer: Self-pay | Admitting: Internal Medicine

## 2016-03-15 DIAGNOSIS — Z1389 Encounter for screening for other disorder: Secondary | ICD-10-CM | POA: Diagnosis not present

## 2016-03-15 DIAGNOSIS — R3911 Hesitancy of micturition: Secondary | ICD-10-CM | POA: Diagnosis not present

## 2016-03-15 DIAGNOSIS — N401 Enlarged prostate with lower urinary tract symptoms: Secondary | ICD-10-CM | POA: Diagnosis not present

## 2016-03-15 DIAGNOSIS — C61 Malignant neoplasm of prostate: Secondary | ICD-10-CM | POA: Diagnosis not present

## 2016-03-15 DIAGNOSIS — I2510 Atherosclerotic heart disease of native coronary artery without angina pectoris: Secondary | ICD-10-CM | POA: Diagnosis not present

## 2016-03-15 DIAGNOSIS — N529 Male erectile dysfunction, unspecified: Secondary | ICD-10-CM | POA: Diagnosis not present

## 2016-03-15 DIAGNOSIS — Z0000 Encounter for general adult medical examination without abnormal findings: Secondary | ICD-10-CM | POA: Diagnosis not present

## 2016-03-15 DIAGNOSIS — R918 Other nonspecific abnormal finding of lung field: Secondary | ICD-10-CM | POA: Diagnosis not present

## 2016-03-15 DIAGNOSIS — E785 Hyperlipidemia, unspecified: Secondary | ICD-10-CM | POA: Diagnosis not present

## 2016-03-15 DIAGNOSIS — E1142 Type 2 diabetes mellitus with diabetic polyneuropathy: Secondary | ICD-10-CM | POA: Diagnosis not present

## 2016-03-15 DIAGNOSIS — E039 Hypothyroidism, unspecified: Secondary | ICD-10-CM | POA: Diagnosis not present

## 2016-03-15 DIAGNOSIS — E063 Autoimmune thyroiditis: Secondary | ICD-10-CM | POA: Diagnosis not present

## 2016-03-15 DIAGNOSIS — Z7189 Other specified counseling: Secondary | ICD-10-CM | POA: Diagnosis not present

## 2016-03-21 ENCOUNTER — Ambulatory Visit
Admission: RE | Admit: 2016-03-21 | Discharge: 2016-03-21 | Disposition: A | Discharge status: Home / Self Care | Payer: Medicare HMO | Source: Ambulatory Visit | Attending: Internal Medicine | Admitting: Internal Medicine

## 2016-03-21 DIAGNOSIS — R918 Other nonspecific abnormal finding of lung field: Secondary | ICD-10-CM

## 2016-03-21 MED ORDER — IOPAMIDOL (ISOVUE-300) INJECTION 61%
75.0000 mL | Freq: Once | INTRAVENOUS | Status: AC | PRN
  Administered 2016-03-21: 75 mL via INTRAVENOUS

## 2016-04-08 ENCOUNTER — Encounter: Payer: Self-pay | Admitting: Physician Assistant

## 2016-04-08 ENCOUNTER — Ambulatory Visit (INDEPENDENT_AMBULATORY_CARE_PROVIDER_SITE_OTHER): Payer: Self-pay | Admitting: Family Medicine

## 2016-04-08 VITALS — BP 130/74 | HR 66 | Temp 98.4°F | Resp 18 | Ht 70.0 in | Wt 151.0 lb

## 2016-04-08 DIAGNOSIS — Z024 Encounter for examination for driving license: Secondary | ICD-10-CM

## 2016-04-08 NOTE — Progress Notes (Signed)
Commercial Driver Medical Examination   Charles Strong is a 76 y.o. male who presents today for a commercial driver fitness determination physical exam for which he is seen for here annually. He does have some chronic but stable medical problems as detailed below.  His PCP is with [facility].  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  He has a history of DM controlled with diet and metformin ER 500mg  qid followed by Dr. Buddy Strong, endocrinology at [facility] every 6 month, last visit was in 01/2016.  Most recent A1c was 7.2 <- 6.6. He stopped glipizide due to hypoglycemia. Still has occ hypoglycemic episodes with jitteriness and diaphoresis, never had any presyncope.   Dr. Buddy Strong is also following pt's Hashimoto's hypothyroid. About 4 months ago, he stopped his levothyroxine and cytomel and instead is doing a high iodine diet and a nature's thyroid supp with good results in lab tests and symptoms.  Charles Strong had an ETT on 12/10/14 concerning for ischemia after an isolated episode of chest tightness on 11/02/14. 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   He did have a pre-syncopal episode 1.5 years ago that was thought due to over-medication resulting in hypotension - a combo of nitro and Cialis. He did not actually have syncope.  He changed his medication and this has not happened again.    He has always had a low pulse due to exercise. He runs a significant amount for exercise - 5 miles daily - and is in very good shape for his age   The following portions of the patient's history were reviewed and updated as appropriate: allergies, current medications, past family history, past medical history, past social history, past surgical history and  problem list. Review of Systems A comprehensive review of systems was negative.   Objective:    Vision: BP 130/74 (BP Location: Right Arm, Patient Position: Sitting, Cuff Size: Small)   Pulse 66   Temp 98.4 F (36.9 C) (Oral)   Resp 18   Ht 5\' 10"  (1.778 m)   Wt 151 lb (68.5 kg)   SpO2 99%   BMI 21.67 kg/m    Visual Acuity Screening   Right eye Left eye Both eyes  Without correction: 20/30 20/25 20/30   With correction:     Comments: Titmus test was 85 degrees in both eyes.  Hearing Screening Comments: Whisper test was 76ft in both ears.  Applicant can recognize and distinguish among traffic control signals and devices showing standard red, green, and amber colors.     Monocular Vision?: No   Hearing:     BP 130/74 (BP Location: Right Arm, Patient Position: Sitting, Cuff Size: Small)   Pulse 66   Temp 98.4 F (36.9 C) (Oral)   Resp 18   Ht 5\' 10"  (1.778 m)   Wt 151 lb (68.5 kg)   SpO2 99%   BMI 21.67 kg/m   General Appearance:    Alert, cooperative, no distress, appears stated age  Head:    Normocephalic, without obvious abnormality, atraumatic  Eyes:    PERRL, conjunctiva/corneas clear, EOM's intact both eyes       Ears:    Normal TM's and external ear canals, both ears  Nose:  Nares normal, septum midline, mucosa normal, no drainage    or sinus tenderness  Throat:   Lips, mucosa, and tongue normal; teeth and gums normal  Neck:   Supple, symmetrical, trachea midline, no adenopathy;       thyroid:  No enlargement/tenderness/nodules  Back:     Symmetric, no curvature, ROM normal, no CVA tenderness  Lungs:     Clear to auscultation bilaterally, respirations unlabored  Chest wall:    No tenderness or deformity  Heart:    Regular rate and rhythm, S1 and S2 normal, no murmur, rub   or gallop  Abdomen:     Soft, non-tender, bowel sounds active all four quadrants,    no masses, no organomegaly        Extremities:   Extremities normal, atraumatic, no cyanosis  or edema  Pulses:   2+ and symmetric all extremities  Skin:   Skin color, texture, turgor normal, no rashes or lesions  Lymph nodes:   Cervical, supraclavicular, and axillary nodes normal  Neurologic:   CN grossly II-XII intact. Normal strength.    Labs: SG 1.005, neg prot, trace bld, neg sugar  Assessment:    Healthy male exam.  Meets standards, but periodic monitoring required due to DMII.  Driver qualified only for 1 year.    Plan:    Medical examiners certificate completed and printed. Return as needed.  History of controlled DM followed by endocrinology on metformin.   He sees cardiology annually due to a h/o abnml stress test but does NOT have coronary artery disease and is not on any prescription medications for HTN or HLD. Prior pre-syncopal episode was due to combination of medications he is no longer on.

## 2016-04-08 NOTE — Patient Instructions (Signed)
     IF you received an x-ray today, you will receive an invoice from [facility]. Please contact [facility] at [phone] with questions or concerns regarding your invoice.   IF you received labwork today, you will receive an invoice from [facility]. Please contact [facility] at [phone] with questions or concerns regarding your invoice.   Our billing staff will not be able to assist you with questions regarding bills from these companies.  You will be contacted with the lab results as soon as they are available. The fastest way to get your results is to activate your My Chart account. Instructions are located on the last page of this paperwork. If you have not heard from us regarding the results in 2 weeks, please contact this office.     

## 2016-05-16 DIAGNOSIS — R69 Illness, unspecified: Secondary | ICD-10-CM | POA: Diagnosis not present

## 2016-06-11 DIAGNOSIS — H40013 Open angle with borderline findings, low risk, bilateral: Secondary | ICD-10-CM | POA: Diagnosis not present

## 2016-06-20 DIAGNOSIS — Z5181 Encounter for therapeutic drug level monitoring: Secondary | ICD-10-CM | POA: Diagnosis not present

## 2016-06-20 DIAGNOSIS — Z7984 Long term (current) use of oral hypoglycemic drugs: Secondary | ICD-10-CM | POA: Diagnosis not present

## 2016-06-20 DIAGNOSIS — E063 Autoimmune thyroiditis: Secondary | ICD-10-CM | POA: Diagnosis not present

## 2016-06-20 DIAGNOSIS — E039 Hypothyroidism, unspecified: Secondary | ICD-10-CM | POA: Diagnosis not present

## 2016-06-20 DIAGNOSIS — E1142 Type 2 diabetes mellitus with diabetic polyneuropathy: Secondary | ICD-10-CM | POA: Diagnosis not present

## 2016-07-04 DIAGNOSIS — H1089 Other conjunctivitis: Secondary | ICD-10-CM | POA: Diagnosis not present

## 2016-09-03 ENCOUNTER — Ambulatory Visit (INDEPENDENT_AMBULATORY_CARE_PROVIDER_SITE_OTHER): Payer: Medicare HMO | Admitting: Cardiovascular Disease

## 2016-09-03 ENCOUNTER — Encounter: Payer: Self-pay | Admitting: Cardiovascular Disease

## 2016-09-03 VITALS — BP 113/67 | HR 57 | Ht 70.0 in | Wt 151.4 lb

## 2016-09-03 DIAGNOSIS — R001 Bradycardia, unspecified: Secondary | ICD-10-CM

## 2016-09-03 DIAGNOSIS — E7800 Pure hypercholesterolemia, unspecified: Secondary | ICD-10-CM

## 2016-09-03 DIAGNOSIS — Q245 Malformation of coronary vessels: Secondary | ICD-10-CM

## 2016-09-03 NOTE — Patient Instructions (Signed)
Follow up in one year.  We will call you to schedule that appointment.

## 2016-09-03 NOTE — Progress Notes (Signed)
Cardiology Office Note   Date:  09/03/2016  ID:  Charles Strong, DOB [birth year], MRN [id number]  PCP:  Lottie Dawson, MD  Cardiologist:   Skeet Latch, MD   Chief Complaint  Patient presents with  . Follow-up    1 year;     Patient ID: Charles Strong is a 76 y.o. male with myocardial bridge, diabetes mellitus type 2, hyperlipidemia, and Hashimotos's thyroiditis who presents for follow up.  History of Present Illness:  Charles Strong was initially seen 11/02/15 after an isolated episode of chest tightness that occurred while running. He runs 5 miles daily. He was referred for an exercise tolerance test on 11/30/14 that was concerning for ischemia. He exercised for 10 minutes on a Bruce protocol, which is 11.8 METs.  He subsequently declined cardiac catheterization and therefore had a cardiac CT angiography on 01/2015.  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 He was started on metoprolol. Mr. Weare also had an episode of syncope. However, this was thought to be due to taking both Imdur and Cialis at the same time.  He has not been interested in statin therapy.  Charles Strong Has been doing well. He continues to run 3 or 4 days per week for approximately 45 or 50 minutes. He is able to run 5 miles during this time. He has not had any chest pain or shortness of breath lately. He also denies lower extremity edema, orthopnea, or PND. He doesn't exercise as much as he used to do to hip and knee pain.   Past Medical History:  Diagnosis Date  . Abnormal stress ECG 01/09/2015  . Chest pain 11/02/2014  . Diabetes mellitus without complication ([place])   . Diabetes type 2, controlled ([place]) 11/02/2014  . 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 Thyroid disease     Past Surgical History:  Procedure Laterality Date  . TONSILLECTOMY       Current Outpatient Prescriptions  Medication Sig Dispense  Refill  . Acetylcysteine (NAC 600 PO) Take 1 tablet by mouth 2 (two) times daily. Reported on 04/05/2015    . Alpha-Lipoic Acid (LIPOIC ACID PO) Take 100 mg by mouth daily. Reported on 04/05/2015    . APPLE CIDER VINEGAR PO Take 250 mg by mouth daily.    . Coenzyme Q10 (CO Q 10 PO) Take 200 mg by mouth daily.    Marland Kitchen glucose blood test strip 1 each by Other route 3 (three) times daily. Use as instructed    . Hyaluronic Acid-Vitamin C (HYALURONIC ACID PO) Take 50 mg by mouth daily.    . Lecithin 1200 MG CAPS Take 1 capsule by mouth daily. Reported on 04/05/2015    . Magnesium 250 MG TABS Take 1 tablet by mouth 4 (four) times daily.    . metFORMIN (GLUCOPHAGE-XR) 500 MG 24 hr tablet Take 500 mg by mouth 4 (four) times daily.  4  . NITROSTAT 0.4 MG SL tablet PLACE 1 TABLET UNDER THE TONGUE EVERY 5 MINUTES AS NEEDED FOR CHEST PAIN (Patient not taking: Reported on 04/08/2016) 25 tablet 5  . Potassium 99 MG TABS Take 4 tablets by mouth daily.    . Selenium 200 MCG TABS Take 200 mcg by mouth daily.    Marland Kitchen thiamine (VITAMIN B-1) 100 MG tablet Take 100 mg by mouth daily.    . TURMERIC PO Take 400 mg by mouth daily.  No current facility-administered medications for this visit.     Allergies:   Calcium fortified cookie [nutritional supplements]    Social History:  The patient  reports that he has never smoked. He has never used smokeless tobacco. He reports that he does not drink alcohol or use drugs.   Family History:  The patient's family history is not on file.    ROS:  Please see the history of present illness.  Otherwise, review of systems are positive for none.   All other systems are reviewed and negative.    PHYSICAL EXAM: VS:  BP 113/67   Pulse (!) 57   Ht 5\' 10"  (1.778 m)   Wt 68.7 kg (151 lb 6.4 oz)   BMI 21.72 kg/m  , BMI Body mass index is 21.72 kg/m. GENERAL:  Well appearing.  No acute distress. HEENT:   Pupils equal round and reactive, fundi not visualized, oral mucosa  unremarkable NECK:  No jugular venous distention, waveform within normal limits, carotid upstroke brisk and symmetric, no bruits, no thyromegaly LUNGS:  Clear to auscultation bilaterally.  No crackles, wheezes, or rhonchi. HEART:  RRR.  PMI not displaced or sustained,S1 and S2 within normal limits, no S3, no S4, no clicks, no rubs, no murmurs ABD:  Flat, positive bowel sounds normal in frequency in pitch, no bruits, no rebound, no guarding, no midline pulsatile mass, no hepatomegaly, no splenomegaly EXT:  2 plus pulses throughout, no edema, no cyanosis no clubbing SKIN:  No rashes no nodules NEURO:  Cranial nerves II through XII grossly intact, motor grossly intact throughout PSYCH:  Cognitively intact, oriented to person place and time  EKG:  EKG is ordered today. The ekg ordered 07/27/15 demonstrates sinus bradycardia at 54 bpm. 09/03/16: sinus bradycardia. Rate 77 bpm.  11/30/14 ETT:  Blood pressure demonstrated a hypertensive response to exercise.  Horizontal ST segment depression ST segment depression was noted during stress in the II, III and aVF leads, beginning at 10 minutes of stress, and returning to baseline after less than 1 minute of recovery.  Recent Labs: 12/31/2015: BUN 13; Creatinine, Ser 1.04; Hemoglobin 13.3; Platelets 221; Potassium 3.2; Sodium 135    Lipid Panel    Component Value Date/Time   CHOL 163 12/19/2014 0913   TRIG 73 12/19/2014 0913   HDL 63 12/19/2014 0913   CHOLHDL 2.6 12/19/2014 0913   VLDL 15 12/19/2014 0913   LDLCALC 85 12/19/2014 0913     Hgb A1c 6.6% Na 142, K 4.3, BUN 12, Creatinine 0.91 AST 22, ALT 24   Wt Readings from Last 3 Encounters:  09/03/16 68.7 kg (151 lb 6.4 oz)  04/08/16 68.5 kg (151 lb)  12/31/15 68 kg (150 lb)    Cardiac CT angiography 01/27/15: IMPRESSION: 1. Coronary calcium score of 0. This was 0 percentile for age and sex matched control. 2. Normal coronary origin. Right dominance. 3. Minimal non-calcified  plaque. 4. 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 This is most probably culprit of patient's symptoms  Other studies Reviewed: Additional studies/ records that were reviewed today include: n/a. Review of the above records demonstrates:  Please see elsewhere in the note.  n/a   ASSESSMENT AND PLAN:  # Chest pain, myocardial bridge:  Mr. Allums Is no longer experiencing chest pain.  He has not been taking metoprolol and continues to exercise without symptoms. No changes are recommended at this time.   # Hyperlipidemia:  We will check lipids and a CMP.  He has  not been interested in statins in the past. He had no coronary calcium on cardiac CT. Therefore and less his lipids are significantly elevated he will continue to manage this with diet and exercise.  # Bradycardia: Asymptomatic. No changes.  Current medicines are reviewed at length with the patient today.  The patient does not have concerns regarding medicines.  The following changes have been made:  no change  Labs/ tests ordered today include:   No orders of the defined types were placed in this encounter.   Disposition:   FU with Dr. Jonelle Sidle C. Oval Linsey in 1 year.   Signed, Skeet Latch, MD  09/03/2016 12:47 PM    [facility]

## 2016-11-12 DIAGNOSIS — E063 Autoimmune thyroiditis: Secondary | ICD-10-CM | POA: Diagnosis not present

## 2016-11-12 DIAGNOSIS — Z5181 Encounter for therapeutic drug level monitoring: Secondary | ICD-10-CM | POA: Diagnosis not present

## 2016-11-12 DIAGNOSIS — E1142 Type 2 diabetes mellitus with diabetic polyneuropathy: Secondary | ICD-10-CM | POA: Diagnosis not present

## 2016-11-12 DIAGNOSIS — Z125 Encounter for screening for malignant neoplasm of prostate: Secondary | ICD-10-CM | POA: Diagnosis not present

## 2016-11-12 DIAGNOSIS — E039 Hypothyroidism, unspecified: Secondary | ICD-10-CM | POA: Diagnosis not present

## 2017-01-14 DIAGNOSIS — E138 Other specified diabetes mellitus with unspecified complications: Secondary | ICD-10-CM | POA: Diagnosis not present

## 2017-01-28 DIAGNOSIS — M25571 Pain in right ankle and joints of right foot: Secondary | ICD-10-CM | POA: Diagnosis not present

## 2017-01-28 DIAGNOSIS — M1711 Unilateral primary osteoarthritis, right knee: Secondary | ICD-10-CM | POA: Diagnosis not present

## 2017-01-28 DIAGNOSIS — M25572 Pain in left ankle and joints of left foot: Secondary | ICD-10-CM | POA: Diagnosis not present

## 2017-01-28 DIAGNOSIS — M1712 Unilateral primary osteoarthritis, left knee: Secondary | ICD-10-CM | POA: Diagnosis not present

## 2017-02-20 DIAGNOSIS — M25532 Pain in left wrist: Secondary | ICD-10-CM | POA: Diagnosis not present

## 2017-02-20 DIAGNOSIS — M25531 Pain in right wrist: Secondary | ICD-10-CM | POA: Diagnosis not present

## 2017-02-20 DIAGNOSIS — M25529 Pain in unspecified elbow: Secondary | ICD-10-CM | POA: Diagnosis not present

## 2017-04-08 ENCOUNTER — Encounter: Payer: Self-pay | Admitting: Urgent Care

## 2017-04-08 ENCOUNTER — Ambulatory Visit (INDEPENDENT_AMBULATORY_CARE_PROVIDER_SITE_OTHER): Payer: Self-pay | Admitting: Urgent Care

## 2017-04-08 ENCOUNTER — Other Ambulatory Visit: Payer: Self-pay

## 2017-04-08 VITALS — BP 122/60 | HR 72 | Temp 97.9°F | Resp 16 | Ht 70.0 in | Wt 148.0 lb

## 2017-04-08 DIAGNOSIS — E119 Type 2 diabetes mellitus without complications: Secondary | ICD-10-CM

## 2017-04-08 DIAGNOSIS — Z87898 Personal history of other specified conditions: Secondary | ICD-10-CM

## 2017-04-08 DIAGNOSIS — Z024 Encounter for examination for driving license: Secondary | ICD-10-CM

## 2017-04-08 NOTE — Patient Instructions (Addendum)
Health Maintenance, Male A healthy lifestyle and preventive care is important for your health and wellness. Ask your health care provider about what schedule of regular examinations is right for you. What should I know about weight and diet? Eat a Healthy Diet  Eat plenty of vegetables, fruits, whole grains, low-fat dairy products, and lean protein.  Do not eat a lot of foods high in solid fats, added sugars, or salt.  Maintain a Healthy Weight Regular exercise can help you achieve or maintain a healthy weight. You should:  Do at least 150 minutes of exercise each week. The exercise should increase your heart rate and make you sweat (moderate-intensity exercise).  Do strength-training exercises at least twice a week.  Watch Your Levels of Cholesterol and Blood Lipids  Have your blood tested for lipids and cholesterol every 5 years starting at 75 years of age. If you are at high risk for heart disease, you should start having your blood tested when you are 75 years old. You may need to have your cholesterol levels checked more often if: ? Your lipid or cholesterol levels are high. ? You are older than 75 years of age. ? You are at high risk for heart disease.  What should I know about cancer screening? Many types of cancers can be detected early and may often be prevented. Lung Cancer  You should be screened every year for lung cancer if: ? You are a current smoker who has smoked for at least 30 years. ? You are a former smoker who has quit within the past 15 years.  Talk to your health care provider about your screening options, when you should start screening, and how often you should be screened.  Colorectal Cancer  Routine colorectal cancer screening usually begins at 75 years of age and should be repeated every 5-10 years until you are 75 years old. You may need to be screened more often if early forms of precancerous polyps or small growths are found. Your health care provider  may recommend screening at an earlier age if you have risk factors for colon cancer.  Your health care provider may recommend using home test kits to check for hidden blood in the stool.  A small camera at the end of a tube can be used to examine your colon (sigmoidoscopy or colonoscopy). This checks for the earliest forms of colorectal cancer.  Prostate and Testicular Cancer  Depending on your age and overall health, your health care provider may do certain tests to screen for prostate and testicular cancer.  Talk to your health care provider about any symptoms or concerns you have about testicular or prostate cancer.  Skin Cancer  Check your skin from head to toe regularly.  Tell your health care provider about any new moles or changes in moles, especially if: ? There is a change in a mole's size, shape, or color. ? You have a mole that is larger than a pencil eraser.  Always use sunscreen. Apply sunscreen liberally and repeat throughout the day.  Protect yourself by wearing long sleeves, pants, a wide-brimmed hat, and sunglasses when outside.  What should I know about heart disease, diabetes, and high blood pressure?  If you are 18-39 years of age, have your blood pressure checked every 3-5 years. If you are 40 years of age or older, have your blood pressure checked every year. You should have your blood pressure measured twice-once when you are at a hospital or clinic, and once when   you are not at a hospital or clinic. Record the average of the two measurements. To check your blood pressure when you are not at a hospital or clinic, you can use: ? An automated blood pressure machine at a pharmacy. ? A home blood pressure monitor.  Talk to your health care provider about your target blood pressure.  If you are between 45-79 years old, ask your health care provider if you should take aspirin to prevent heart disease.  Have regular diabetes screenings by checking your fasting blood  sugar level. ? If you are at a normal weight and have a low risk for diabetes, have this test once every three years after the age of 45. ? If you are overweight and have a high risk for diabetes, consider being tested at a younger age or more often.  A one-time screening for abdominal aortic aneurysm (AAA) by ultrasound is recommended for men aged 65-75 years who are current or former smokers. What should I know about preventing infection? Hepatitis B If you have a higher risk for hepatitis B, you should be screened for this virus. Talk with your health care provider to find out if you are at risk for hepatitis B infection. Hepatitis C Blood testing is recommended for:  Everyone born from 1945 through 1965.  Anyone with known risk factors for hepatitis C.  Sexually Transmitted Diseases (STDs)  You should be screened each year for STDs including gonorrhea and chlamydia if: ? You are sexually active and are younger than 75 years of age. ? You are older than 75 years of age and your health care provider tells you that you are at risk for this type of infection. ? Your sexual activity has changed since you were last screened and you are at an increased risk for chlamydia or gonorrhea. Ask your health care provider if you are at risk.  Talk with your health care provider about whether you are at high risk of being infected with HIV. Your health care provider may recommend a prescription medicine to help prevent HIV infection.  What else can I do?  Schedule regular health, dental, and eye exams.  Stay current with your vaccines (immunizations).  Do not use any tobacco products, such as cigarettes, chewing tobacco, and e-cigarettes. If you need help quitting, ask your health care provider.  Limit alcohol intake to no more than 2 drinks per day. One drink equals 12 ounces of beer, 5 ounces of wine, or 1 ounces of hard liquor.  Do not use street drugs.  Do not share needles.  Ask your  health care provider for help if you need support or information about quitting drugs.  Tell your health care provider if you often feel depressed.  Tell your health care provider if you have ever been abused or do not feel safe at home. This information is not intended to replace advice given to you by your health care provider. Make sure you discuss any questions you have with your health care provider. Document Released: 08/10/2007 Document Revised: 10/11/2015 Document Reviewed: 11/15/2014 Elsevier Interactive Patient Education  2018 [employer]     IF you received an x-ray today, you will receive an invoice from [facility]. Please contact [facility] at [phone] with questions or concerns regarding your invoice.   IF you received labwork today, you will receive an invoice from [facility]. Please contact [facility] at [phone] with questions or concerns regarding your invoice.   Our billing staff will not be   able to assist you with questions regarding bills from these companies.  You will be contacted with the lab results as soon as they are available. The fastest way to get your results is to activate your My Chart account. Instructions are located on the last page of this paperwork. If you have not heard from us regarding the results in 2 weeks, please contact this office.       

## 2017-04-08 NOTE — Progress Notes (Signed)
    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 The patient reports diabetes, managed with metformin and glipizide. Patient cleared by cardiology on 09/03/2016. He was stable then, recommended follow up in 1 year. Denies dizziness, chronic headache, chest pain, shortness of breath, heart racing, palpitations, nausea, vomiting, abdominal pain, hematuria, lower leg swelling. Denies smoking cigarettes.   The following portions of the patient's history were reviewed and updated as appropriate: allergies, current medications, past family history, past medical history, past social history and past surgical history.  Objective:   BP 122/60   Pulse 72   Temp 97.9 F (36.6 C) (Oral)   Resp 16   Ht 5\' 10"  (1.778 m)   Wt 148 lb (67.1 kg)   SpO2 96%   BMI 21.24 kg/m   Vision/hearing:  Visual Acuity Screening   Right eye Left eye Both eyes  Without correction: 20/20 20/30 20/20   With correction:     Comments: 85 degree field of vision bilateral, monocular vision, can distinguish between red, green, and amber.  Hearing Screening Comments: Whisper test passed - bilateral ears at 39ft  Patient can recognize and distinguish among traffic control signals and devices showing standard red, green, and amber colors.  Corrective lenses required: No  Monocular Vision?: No  Hearing aid requirement: No  Physical Exam  Constitutional: He is oriented to person, place, and time. He appears well-developed and well-nourished.  HENT:  TM's intact bilaterally, no effusions or erythema. Nasal turbinates pink and moist, nasal passages patent. No sinus tenderness. Oropharynx clear, mucous membranes moist, dentition in good repair.  Eyes: Conjunctivae and EOM are normal. Pupils are equal, round, and reactive to light. Right eye exhibits no discharge. Left eye exhibits no discharge. No scleral icterus.  Neck: Normal range of motion.  Neck supple. No thyromegaly present.  Cardiovascular: Normal rate, regular rhythm and intact distal pulses. Exam reveals no gallop and no friction rub.  No murmur heard. Pulmonary/Chest: No stridor. No respiratory distress. He has no wheezes. He has no rales.  Abdominal: Soft. Bowel sounds are normal. He exhibits no distension and no mass. There is no tenderness.  Musculoskeletal: Normal range of motion. He exhibits no edema or tenderness.  Lymphadenopathy:    He has no cervical adenopathy.  Neurological: He is alert and oriented to person, place, and time. He has normal reflexes. He displays normal reflexes. Coordination normal.  Skin: Skin is warm and dry. No rash noted. No erythema. No pallor.  Psychiatric: He has a normal mood and affect.   Labs:    Sp gr: 1.020, negative for blood, protein, glucose  Assessment:    Healthy male exam.  Meets standards, but periodic monitoring required due to DM.  Driver qualified only for 1 year.    Plan:   Medical examiners certificate completed and printed. Return as needed.  Jaynee Eagles, PA-C [facility] [phone] 04/08/2017  3:57 PM

## 2017-05-14 DIAGNOSIS — R69 Illness, unspecified: Secondary | ICD-10-CM | POA: Diagnosis not present

## 2017-06-19 DIAGNOSIS — E1142 Type 2 diabetes mellitus with diabetic polyneuropathy: Secondary | ICD-10-CM | POA: Diagnosis not present

## 2017-06-19 DIAGNOSIS — E039 Hypothyroidism, unspecified: Secondary | ICD-10-CM | POA: Diagnosis not present

## 2017-06-19 DIAGNOSIS — E063 Autoimmune thyroiditis: Secondary | ICD-10-CM | POA: Diagnosis not present

## 2017-06-19 DIAGNOSIS — Z7984 Long term (current) use of oral hypoglycemic drugs: Secondary | ICD-10-CM | POA: Diagnosis not present

## 2017-09-01 DIAGNOSIS — E1142 Type 2 diabetes mellitus with diabetic polyneuropathy: Secondary | ICD-10-CM | POA: Diagnosis not present

## 2017-09-01 DIAGNOSIS — Z681 Body mass index (BMI) 19 or less, adult: Secondary | ICD-10-CM | POA: Diagnosis not present

## 2017-09-01 DIAGNOSIS — Z1389 Encounter for screening for other disorder: Secondary | ICD-10-CM | POA: Diagnosis not present

## 2017-09-01 DIAGNOSIS — E039 Hypothyroidism, unspecified: Secondary | ICD-10-CM | POA: Diagnosis not present

## 2017-09-01 DIAGNOSIS — Z0000 Encounter for general adult medical examination without abnormal findings: Secondary | ICD-10-CM | POA: Diagnosis not present

## 2017-09-01 DIAGNOSIS — E063 Autoimmune thyroiditis: Secondary | ICD-10-CM | POA: Diagnosis not present

## 2017-09-01 DIAGNOSIS — E538 Deficiency of other specified B group vitamins: Secondary | ICD-10-CM | POA: Diagnosis not present

## 2017-09-01 DIAGNOSIS — E785 Hyperlipidemia, unspecified: Secondary | ICD-10-CM | POA: Diagnosis not present

## 2017-09-01 DIAGNOSIS — Z7189 Other specified counseling: Secondary | ICD-10-CM | POA: Diagnosis not present

## 2017-10-02 DIAGNOSIS — E1142 Type 2 diabetes mellitus with diabetic polyneuropathy: Secondary | ICD-10-CM | POA: Diagnosis not present

## 2017-10-02 DIAGNOSIS — E063 Autoimmune thyroiditis: Secondary | ICD-10-CM | POA: Diagnosis not present

## 2017-11-07 ENCOUNTER — Other Ambulatory Visit: Payer: Self-pay | Admitting: Cardiovascular Disease

## 2017-11-07 ENCOUNTER — Encounter: Payer: Self-pay | Admitting: Cardiovascular Disease

## 2017-11-07 ENCOUNTER — Ambulatory Visit (INDEPENDENT_AMBULATORY_CARE_PROVIDER_SITE_OTHER): Payer: PPO | Admitting: Cardiovascular Disease

## 2017-11-07 VITALS — BP 126/72 | HR 56 | Ht 70.0 in | Wt 134.8 lb

## 2017-11-07 DIAGNOSIS — R001 Bradycardia, unspecified: Secondary | ICD-10-CM | POA: Diagnosis not present

## 2017-11-07 DIAGNOSIS — Q245 Malformation of coronary vessels: Secondary | ICD-10-CM | POA: Diagnosis not present

## 2017-11-07 DIAGNOSIS — E7800 Pure hypercholesterolemia, unspecified: Secondary | ICD-10-CM

## 2017-11-07 MED ORDER — FLUTICASONE PROPIONATE 50 MCG/ACT NA SUSP: 2.0000 | Freq: Every day | NASAL | 1 refills | Status: DC

## 2017-11-07 MED ORDER — FLUTICASONE PROPIONATE 50 MCG/ACT NA SUSP: 2.0000 | Freq: Every day | NASAL | 1 refills | Status: DC | PRN

## 2017-11-07 MED ORDER — GUAIFENESIN ER 600 MG PO TB12: 600.0000 mg | ORAL_TABLET | Freq: Two times a day (BID) | ORAL | 1 refills | Status: DC | PRN

## 2017-11-07 NOTE — Patient Instructions (Addendum)
Medication Instructions:  START FLONASE 2 SPRAYS EACH NOSTRIL DAILY AS NEEDED   START GUAIFENESIN PLAIN 600 MG TWICE A DAY AS NEEDED FOR CONGESTION/COUGH  Labwork: NONE  Testing/Procedures: NONE  Follow-Up: Your physician wants you to follow-up in: 1 YEAR  You will receive a reminder letter in the mail two months in advance. If you don't receive a letter, please call our office to schedule the follow-up appointment.  If you need a refill on your cardiac medications before your next appointment, please call your pharmacy.

## 2017-11-07 NOTE — Addendum Note (Signed)
Addended by: Alvina Filbert B on: 11/07/2017 05:54 PM   Modules accepted: Orders

## 2017-11-07 NOTE — Progress Notes (Signed)
Cardiology Office Note   Date:  11/07/2017  ID:  TANNEN VANDEZANDE, DOB October 24, 1940, MRN [id number]  PCP:  [name], MD  Cardiologist:   Skeet Latch, MD   No chief complaint on file.    Patient ID: Charles Strong is a 77 y.o. male with myocardial bridge, diabetes mellitus type 2, hyperlipidemia, and Hashimotos's thyroiditis who presents for follow up.  History of Present Illness:  Mr. Burleson was initially seen 11/02/15 after an isolated episode of chest tightness that occurred while running. He runs 5 miles daily. He was referred for an exercise tolerance test on 11/30/14 that was concerning for ischemia. He exercised for 10 minutes on a Bruce protocol, which is 11.8 METs.  He subsequently declined cardiac catheterization and therefore had a cardiac CT angiography on 01/2015.  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 He was started on metoprolol. Mr. Voorhies also had an episode of syncope. However, this was thought to be due to taking both Imdur and Cialis at the same time.  He has not been interested in statin therapy.  He  He has been doing the keto diet since March 2019.  He has lost 18 lb. he feels well and has no chest pain with exertion.  He continues to run 3 or 4 days/week.  He is able to run 5 miles within 45 to 50 minutes.  He has not experienced any lower extremity edema, orthopnea, or PND.  He continues to run barefoot.  Since losing weight he has been able to cut back some on his diabetes medications.  His only complaint now is upper respiratory symptoms that have been ongoing for the last 3 or 4 days.  Past Medical History:  Diagnosis Date  . Abnormal stress ECG 01/09/2015  . Chest pain 11/02/2014  . Diabetes mellitus without complication ([place])   . Diabetes type 2, controlled ([place]) 11/02/2014  . 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 Thyroid disease     Past Surgical  History:  Procedure Laterality Date  . TONSILLECTOMY       Current Outpatient Medications  Medication Sig Dispense Refill  . APPLE CIDER VINEGAR PO Take 250 mg by mouth daily.    . Coenzyme Q10 (CO Q 10 PO) Take 200 mg by mouth daily.    Marland Kitchen glucose blood test strip 1 each by Other route 3 (three) times daily. Use as instructed    . metFORMIN (GLUCOPHAGE-XR) 500 MG 24 hr tablet Take 500 mg by mouth daily with breakfast.   4  . metoprolol tartrate (LOPRESSOR) 25 MG tablet Take 12.5 mg by mouth 2 (two) times daily as needed.    Marland Kitchen NITROSTAT 0.4 MG SL tablet PLACE 1 TABLET UNDER THE TONGUE EVERY 5 MINUTES AS NEEDED FOR CHEST PAIN 25 tablet 5  . Potassium 99 MG TABS Take 4 tablets by mouth daily.    . Selenium 200 MCG TABS Take 200 mcg by mouth daily.    Marland Kitchen thiamine (VITAMIN B-1) 100 MG tablet Take 100 mg by mouth daily.    . TURMERIC PO Take 400 mg by mouth daily.    . fluticasone (FLONASE) 50 MCG/ACT nasal spray PLACE 2 SPRAYS IN EACH NOSTRIL DAILY AS NEEDED FOR ALLERGIES 48 g 0  . guaiFENesin (MUCINEX) 600 MG 12 hr tablet Take 1 tablet (600 mg total) by mouth 2 (two) times daily as needed for cough (OR  CONGESTION). 60 tablet 1   No current facility-administered medications for this visit.     Allergies:   Calcium fortified cookie [nutritional supplements]    Social History:  The patient  reports that he has never smoked. He has never used smokeless tobacco. He reports that he does not drink alcohol or use drugs.   Family History:  The patient's family history is not on file.    ROS:  Please see the history of present illness.  Otherwise, review of systems are positive for none.   All other systems are reviewed and negative.    PHYSICAL EXAM: VS:  BP 126/72   Pulse (!) 56   Ht 5\' 10"  (1.778 m)   Wt 134 lb 12.8 oz (61.1 kg)   BMI 19.34 kg/m  , BMI Body mass index is 19.34 kg/m. GENERAL:  Well appearing HEENT: Pupils equal round and reactive, fundi not visualized, oral mucosa  unremarkable NECK:  No jugular venous distention, waveform within normal limits, carotid upstroke brisk and symmetric, no bruits, no thyromegaly LYMPHATICS:  No cervical adenopathy LUNGS:  Clear to auscultation bilaterally HEART: Bradycardic.  Regular rhythm.  PMI not displaced or sustained,S1 and S2 within normal limits, no S3, no S4, no clicks, no rubs, no murmurs ABD:  Flat, positive bowel sounds normal in frequency in pitch, no bruits, no rebound, no guarding, no midline pulsatile mass, no hepatomegaly, no splenomegaly EXT:  2 plus pulses throughout, no edema, no cyanosis no clubbing SKIN:  No rashes no nodules NEURO:  Cranial nerves II through XII grossly intact, motor grossly intact throughout PSYCH:  Cognitively intact, oriented to person place and time   EKG:  EKG is ordered today. The ekg ordered 07/27/15 demonstrates sinus bradycardia at 54 bpm. 09/03/16: sinus bradycardia. Rate 77 bpm. 11/07/17: Sinus bradycardia.  Rate 56 bpm.    11/30/14 ETT:  Blood pressure demonstrated a hypertensive response to exercise.  Horizontal ST segment depression ST segment depression was noted during stress in the II, III and aVF leads, beginning at 10 minutes of stress, and returning to baseline after less than 1 minute of recovery.  Recent Labs: No results found for requested labs within last 8760 hours.    Lipid Panel    Component Value Date/Time   CHOL 163 12/19/2014 0913   TRIG 73 12/19/2014 0913   HDL 63 12/19/2014 0913   CHOLHDL 2.6 12/19/2014 0913   VLDL 15 12/19/2014 0913   LDLCALC 85 12/19/2014 0913   09/01/2017: Total cholesterol 192, triglycerides 122, HDL 64, LDL 104 Potassium 4.5, creatinine 0.76 TSH 2.12 Hemoglobin A1c 5.8%    Wt Readings from Last 3 Encounters:  11/07/17 134 lb 12.8 oz (61.1 kg)  04/08/17 148 lb (67.1 kg)  09/03/16 151 lb 6.4 oz (68.7 kg)    Cardiac CT angiography 01/27/15: IMPRESSION: 1. Coronary calcium score of 0. This was 0 percentile for age  and sex matched control. 2. Normal coronary origin. Right dominance. 3. Minimal non-calcified plaque. 4. 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 This is most probably culprit of patient's symptoms  Other studies Reviewed: Additional studies/ records that were reviewed today include: n/a. Review of the above records demonstrates:  Please see elsewhere in the note.  n/a   ASSESSMENT AND PLAN:  # Chest pain, myocardial bridge:  Mr. Guderian Is no longer experiencing chest pain.  He exercises regularly without any ischemia.  # Hyperlipidemia: Lipids are more elevated, likely due to his keto diet.  He has  not been interested in statins in the past. He had no coronary calcium on cardiac CT. therefore it is challenging to argue that he actually needs one.  Continue with exercise and try to eat more healthy fats.  # Bradycardia: Asymptomatic.  Likely due to chronic exercise.   # URI: Flonase and guaifenesin.  F/u with PCP if no improvement in 7 days.  Current medicines are reviewed at length with the patient today.  The patient does not have concerns regarding medicines.  The following changes have been made:  no change  Labs/ tests ordered today include:   No orders of the defined types were placed in this encounter.   Disposition:   FU with Dr. Jonelle Sidle C. Oval Linsey in 1 year.   Signed, Skeet Latch, MD  11/07/2017 5:24 PM    [place]

## 2017-11-15 ENCOUNTER — Other Ambulatory Visit: Payer: Self-pay

## 2017-11-15 ENCOUNTER — Emergency Department (HOSPITAL_COMMUNITY)
Admission: EM | Admit: 2017-11-15 | Discharge: 2017-11-15 | Disposition: A | Discharge status: Home / Self Care | Payer: PPO | Attending: Emergency Medicine | Admitting: Emergency Medicine

## 2017-11-15 ENCOUNTER — Encounter (HOSPITAL_COMMUNITY): Payer: Self-pay | Admitting: Emergency Medicine

## 2017-11-15 DIAGNOSIS — R339 Retention of urine, unspecified: Secondary | ICD-10-CM

## 2017-11-15 DIAGNOSIS — E119 Type 2 diabetes mellitus without complications: Secondary | ICD-10-CM | POA: Diagnosis not present

## 2017-11-15 DIAGNOSIS — Z7984 Long term (current) use of oral hypoglycemic drugs: Secondary | ICD-10-CM | POA: Diagnosis not present

## 2017-11-15 DIAGNOSIS — Z79899 Other long term (current) drug therapy: Secondary | ICD-10-CM | POA: Diagnosis not present

## 2017-11-15 DIAGNOSIS — E079 Disorder of thyroid, unspecified: Secondary | ICD-10-CM | POA: Insufficient documentation

## 2017-11-15 LAB — URINALYSIS, ROUTINE W REFLEX MICROSCOPIC
Bacteria, UA: NONE SEEN
Bilirubin Urine: NEGATIVE
Glucose, UA: NEGATIVE mg/dL
Ketones, ur: 20 mg/dL — AB
Nitrite: NEGATIVE
Protein, ur: NEGATIVE mg/dL
Specific Gravity, Urine: 1.008 (ref 1.005–1.030)
pH: 5 (ref 5.0–8.0)

## 2017-11-15 MED ORDER — LIDOCAINE HCL URETHRAL/MUCOSAL 2 % EX GEL
CUTANEOUS | Status: AC
  Administered 2017-11-15: 1 via URETHRAL
  Filled 2017-11-15: qty 5

## 2017-11-15 MED ORDER — LIDOCAINE HCL URETHRAL/MUCOSAL 2 % EX GEL
1.0000 "application " | Freq: Once | CUTANEOUS | Status: AC | PRN
  Administered 2017-11-15: 1 via URETHRAL

## 2017-11-15 NOTE — Discharge Instructions (Addendum)
Follow-up with your urologist next week and return to the emergency department if you experience any new and/or concerning symptoms.

## 2017-11-15 NOTE — ED Notes (Signed)
Urine culture sent down with UA. 

## 2017-11-15 NOTE — ED Provider Notes (Signed)
[facility] DEPT Provider Note   CSN: [id number] Arrival date & time: 11/15/17  0018     History   Chief Complaint Chief Complaint  Patient presents with  . Urinary Retention    HPI Charles Strong is a 77 y.o. male.  Patient is a 77 year old male with history of BPH, diabetes.  He presents with urinary retention.  States he has been having difficulty voiding over the past 12 hours.  He feels full and distended.  He denies any fevers, chills, or dysuria.  He denies any back pain or leg weakness/numbness.  He has had similar episodes in the past and has required a Foley catheter.  The history is provided by the patient.    Past Medical History:  Diagnosis Date  . Abnormal stress ECG 01/09/2015  . Chest pain 11/02/2014  . Diabetes mellitus without complication ([place])   . Diabetes type 2, controlled ([place]) 11/02/2014  . 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 Thyroid disease     Patient Active Problem List   Diagnosis Date Noted  . 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 Abnormal stress ECG 01/09/2015  . Chest pain 11/02/2014  . Hyperlipidemia 11/02/2014  . Diabetes type 2, controlled ([place]) 11/02/2014    Past Surgical History:  Procedure Laterality Date  . TONSILLECTOMY          Home Medications    Prior to Admission medications   Medication Sig Start Date End Date Taking? Authorizing Provider  APPLE CIDER VINEGAR PO Take 250 mg by mouth daily.    [provider]  Coenzyme Q10 (CO Q 10 PO) Take 200 mg by mouth daily.    [provider]  fluticasone (FLONASE) 50 MCG/ACT nasal spray PLACE 2 SPRAYS IN EACH NOSTRIL DAILY AS NEEDED FOR ALLERGIES 11/07/17   Skeet Latch, MD  glucose blood test strip 1 each by Other route 3 (three) times daily. Use as instructed    [provider]  guaiFENesin (MUCINEX) 600 MG 12 hr tablet Take 1 tablet (600 mg total) by mouth 2 (two) times daily as needed for cough (OR CONGESTION). 11/07/17    Skeet Latch, MD  metFORMIN (GLUCOPHAGE-XR) 500 MG 24 hr tablet Take 500 mg by mouth daily with breakfast.  10/21/14   [provider]  metoprolol tartrate (LOPRESSOR) 25 MG tablet Take 12.5 mg by mouth 2 (two) times daily as needed.    [provider]  NITROSTAT 0.4 MG SL tablet PLACE 1 TABLET UNDER THE TONGUE EVERY 5 MINUTES AS NEEDED FOR CHEST PAIN 01/22/16   Skeet Latch, MD  Potassium 99 MG TABS Take 4 tablets by mouth daily.    [provider]  Selenium 200 MCG TABS Take 200 mcg by mouth daily.    [provider]  thiamine (VITAMIN B-1) 100 MG tablet Take 100 mg by mouth daily.    [provider]  TURMERIC PO Take 400 mg by mouth daily.    [provider]    Family History No family history on file.  Social History Social History   Tobacco Use  . Smoking status: Never Smoker  . Smokeless tobacco: Never Used  Substance Use Topics  . Alcohol use: No  . Drug use: No     Allergies   Calcium fortified cookie [nutritional supplements]   Review of Systems Review of Systems  All other systems reviewed and are negative.    Physical Exam Updated Vital Signs BP (!) 117/94 (BP Location: Right Arm)  Pulse (!) 117   Temp 98.1 F (36.7 C) (Oral)   Resp 16   SpO2 99%   Physical Exam  Constitutional: He is oriented to person, place, and time. He appears well-developed and well-nourished. No distress.  HENT:  Head: Normocephalic and atraumatic.  Mouth/Throat: Oropharynx is clear and moist.  Neck: Normal range of motion. Neck supple.  Cardiovascular: Normal rate and regular rhythm. Exam reveals no friction rub.  No murmur heard. Pulmonary/Chest: Effort normal and breath sounds normal. No respiratory distress. He has no wheezes. He has no rales.  Abdominal: Soft. Bowel sounds are normal. He exhibits no distension. There is no tenderness.  Musculoskeletal: Normal range of motion. He exhibits no edema.    Neurological: He is alert and oriented to person, place, and time. Coordination normal.  Skin: Skin is warm and dry. He is not diaphoretic.  Nursing note and vitals reviewed.    ED Treatments / Results  Labs (all labs ordered are listed, but only abnormal results are displayed) Labs Reviewed  URINALYSIS, ROUTINE W REFLEX MICROSCOPIC    EKG None  Radiology No results found.  Procedures Procedures (including critical care time)  Medications Ordered in ED Medications  lidocaine (XYLOCAINE) 2 % jelly 1 application (1 application Urethral Given 11/15/17 0115)     Initial Impression / Assessment and Plan / ED Course  I have reviewed the triage vital signs and the nursing notes.  Pertinent labs & imaging results that were available during my care of the patient were reviewed by me and considered in my medical decision making (see chart for details).  Foley catheter placed with over thousand cc of urine resulting and resolution of symptoms.  Urinalysis shows no definite infection.  He will be discharged with a Foley catheter in place and is to follow-up with urology next week.  Final Clinical Impressions(s) / ED Diagnoses   Final diagnoses:  None    ED Discharge Orders    None       Veryl Speak, MD 11/15/17 [phone]

## 2017-11-15 NOTE — ED Triage Notes (Signed)
Patient presents with the inability to empty his bladder for 14 hours. Patient states hes been "trickling" throughout the day. Patient states he has BPH. Attempted to take medication to help with flow but it did not.

## 2017-11-20 DIAGNOSIS — C61 Malignant neoplasm of prostate: Secondary | ICD-10-CM | POA: Diagnosis not present

## 2017-11-20 DIAGNOSIS — R338 Other retention of urine: Secondary | ICD-10-CM | POA: Diagnosis not present

## 2017-11-20 DIAGNOSIS — R310 Gross hematuria: Secondary | ICD-10-CM | POA: Diagnosis not present

## 2017-11-20 DIAGNOSIS — N401 Enlarged prostate with lower urinary tract symptoms: Secondary | ICD-10-CM | POA: Diagnosis not present

## 2017-11-24 ENCOUNTER — Other Ambulatory Visit: Payer: Self-pay | Admitting: *Deleted

## 2017-11-24 NOTE — Patient Outreach (Signed)
[facility]) Care Management  11/24/2017  Charles Strong 11/26/1940 [id number]  Telephone Screen  Referral Date: 11/18/17 Referral Source: [facility] referral  Referral Reason: "member needs assistance with Rapaflo co pay, this medication is most eff but had to stop taking it due to the cost"  Insurance: HTA    Outreach attempt # 1 No answer. [facility] RN CM left HIPAA compliant voicemail message along with CM's contact info.   Plan: [facility] RN CM sent an unsuccessful outreach letter and scheduled this patient for another call attempt within 4 business days    Kimberly L. [name], RN, BSN, [place] Coordinator Office number [phone] Mobile number [phone]  Main [facility] number [phone] Fax number [phone]

## 2017-11-25 ENCOUNTER — Ambulatory Visit: Payer: Self-pay | Admitting: *Deleted

## 2017-11-26 ENCOUNTER — Encounter: Payer: Self-pay | Admitting: *Deleted

## 2017-11-26 ENCOUNTER — Other Ambulatory Visit: Payer: Self-pay | Admitting: *Deleted

## 2017-11-26 NOTE — Addendum Note (Signed)
Addended by: Barbaraann Faster on: 11/26/2017 04:45 PM   Modules accepted: Orders

## 2017-11-26 NOTE — Patient Outreach (Signed)
  [facility]) Care Management  11/26/2017  Charles Strong 27-Jun-1940 [id number]   Telephone Screen  Referral Date:11/18/17 Referral Source:[facility] referral Referral Reason:"member needs assistance with Rapaflo co pay, this medication is most eff but had to stop taking it due to the cost" Insurance:HTA   Patient returned a call to [place] CM and left a voice message, CM returned his call  Patient is able to verify HIPAA Reviewed and addressed referral to [facility] with patient   [name] reports a recent ED visit for urinary retention related to BPH after he ran out of Rapaflo He ran out of Rapaflo because of bills and car issues  Since his ED visit he has been able to get his car fixed   Social [name] reports he has one person that is a good support person for him. He drives to medical appointments now that his car is fixed. He has and is able to use [facility] to get to medical appointments. He is independent with ADLs and iADLs. He likes to garden    Appointments: Sees primary care MD q 3- 6 months Seen last about 2 months ago    Advance Directives: Denies need for assist with advance directives   Conditions DM type 2 (last A1c 5.5), recent lost 35 lbs intentionally,  Myocardial bridge, Chest pain, hyperlipidemia, BPH    Medication- Rapaflo or silodosin,  Generic, rx by was given to him by his new urologist, [name] and a prior MD authorization was provided  The cost of Rapaflo at [facility]  went from $350 (when under [place]) to $35 He voices interest in assistance from [place] if there is possibly another way of decreasing the cost. He voiced he would understand if there is not any other way of decreasing the cost but "it is worth trying or finding out." At this time he is doing okay with the cost but is on a fixed income, SSI and has had some financial issues in the last few months He denies cost issues with any other medications on his medicine  list   Flu shot- He does not take flu shots He reports "makes me too sick" He reports his primary MD is aware but the MD wants him to take another pneumonia shot. CM discussed also the Shingles shot He states he has not had chicken pox     Consent: [facility] RN CM reviewed [facility] services with patient. Patient gave verbal consent for services [facility] and pharmacy.  Plan  [facility] RN CM will refer to [facility] for possible financial assistance (co pay at hospital $125, pending recent ED bill and on SSI, on a fixed income and had to pay to get his car fixed)  or community resource  [place] will refer to [employer] for possible resource to further decrease the co pay/cost of [place]. [name], RN, BSN, [place] Coordinator Office number [phone] Mobile number [phone]  Main [facility] number [phone] Fax number [phone]

## 2017-11-26 NOTE — Patient Outreach (Signed)
[facility]) Care Management  11/26/2017  [name] 1940-06-20 [id number]   Telephone Screen  Referral Date: 11/18/17 Referral Source: [facility] referral  Referral Reason: "member needs assistance with Rapaflo co pay, this medication is most eff but had to stop taking it due to the cost"  Insurance: HTA   Outreach attempt # 2 No answer. [facility] RN CM left HIPAA compliant voicemail message along with CM's contact info.   Plan: [facility] RN CM  scheduled this patient for a third/last call attempt within 4 business days    Laykin Rainone L. [name], RN, BSN, [place] Coordinator Office number [phone] Mobile number [phone]  Main [facility] number [phone] Fax number [phone]

## 2017-11-28 ENCOUNTER — Other Ambulatory Visit: Payer: Self-pay

## 2017-11-28 NOTE — Patient Outreach (Signed)
[facility]) Care Management  [place]   11/28/2017  OAKLAND FANT 1940/11/26 [id number]  77 year old male referred to [place] Management.  [place] services requested for medication assistance for Rapaflo.  PMHx includes, but not limited to, type 2 diabetes mellitus, hyperlipidemia and benign prostatic hyperplasia.    Successful outreach attempt to Mr. Delorse Lek.  HIPAA identifiers verified.   Medication Assistance: Mr. Cowher was referred to [place] for medication assistance for Rapaflo.  He reports that he is now getting generic Rapaflo for $35/mo, while he used to have to pay over $300/mo for brand name.  He states that he can afford $35/mo for this prescription and that his other medications are inexpensive.   Current Medications: Current Outpatient Medications  Medication Sig Dispense Refill  . APPLE CIDER VINEGAR PO Take 30 mLs by mouth daily.     . Coenzyme Q10 (CO Q 10 PO) Take 200 mg by mouth daily.    . finasteride (PROSCAR) 5 MG tablet Take 5 mg by mouth daily.   11  . fluticasone (FLONASE) 50 MCG/ACT nasal spray PLACE 2 SPRAYS IN EACH NOSTRIL DAILY AS NEEDED FOR ALLERGIES 48 g 0  . glucose blood test strip 1 each by Other route 3 (three) times daily. Use as instructed    . metFORMIN (GLUCOPHAGE-XR) 500 MG 24 hr tablet Take 500 mg by mouth daily with breakfast.   4  . metoprolol tartrate (LOPRESSOR) 25 MG tablet Take 12.5 mg by mouth 2 (two) times daily as needed.    Marland Kitchen NITROSTAT 0.4 MG SL tablet PLACE 1 TABLET UNDER THE TONGUE EVERY 5 MINUTES AS NEEDED FOR CHEST PAIN 25 tablet 5  . Potassium 99 MG TABS Take 4 tablets by mouth daily.    . Selenium 200 MCG TABS Take 200 mcg by mouth daily.    . silodosin (RAPAFLO) 8 MG CAPS capsule Take 8 mg by mouth daily with breakfast.   11  . thiamine (VITAMIN B-1) 100 MG tablet Take 100 mg by mouth daily.    . TURMERIC PO Take 400 mg by mouth daily.    Marland Kitchen guaiFENesin (MUCINEX) 600 MG 12 hr tablet  Take 1 tablet (600 mg total) by mouth 2 (two) times daily as needed for cough (OR CONGESTION). (Patient not taking: Reported on 11/28/2017) 60 tablet 1   No current facility-administered medications for this visit.    Mr. Balogh states that he has no further medication questions or concerns at this time.  I will close his [place] case and remove my name from his care team.  Joetta Manners, PharmD Clinical Pharmacist [place] [phone]  Plan: Route discipline closure letter to PCP, Dr. Fara Olden.   Joetta Manners, PharmD Clinical Pharmacist [place] [phone]

## 2017-12-01 ENCOUNTER — Ambulatory Visit: Payer: Self-pay | Admitting: *Deleted

## 2017-12-01 ENCOUNTER — Other Ambulatory Visit: Payer: Self-pay

## 2017-12-01 DIAGNOSIS — R310 Gross hematuria: Secondary | ICD-10-CM | POA: Diagnosis not present

## 2017-12-01 DIAGNOSIS — N200 Calculus of kidney: Secondary | ICD-10-CM | POA: Diagnosis not present

## 2017-12-01 NOTE — Patient Outreach (Signed)
[place]) Care Management  12/01/2017  JAYDIN BONIFACE 09-Nov-1940 [id number]  BSW attempted to contact the patient on today's date to conduct a community resource consult. Unfortunately, today's call was unsuccessful. BSW left the patient a HIPAA compliant voice message requesting a return call.   Plan: BSW will mail the patient an unsuccessful outreach letter. BSW will attempt the patient again within the next four business days.  Daneen Schick, BSW, CDP [facility] [phone]

## 2017-12-04 ENCOUNTER — Other Ambulatory Visit: Payer: Self-pay

## 2017-12-04 NOTE — Patient Outreach (Signed)
[facility]) Care Management  12/04/2017  Charles Strong 1940-08-18 [id number]  Successful outreach to the patient on today's date, HIPAA identifiers confirmed. BSW introduced self to the patient and the reason for today's call, indicating the patient had been referred for financial assistance surrounding a recent medical bill. The patient reports he was recently in the ED at [facility]. BSW spoke with the patient about the [place] application. The patient is agreeable to this BSW mailing the application to the patients home for review.   The patient reports "I was mainly interested in help affording medicine". BSW reviewed chart and discussed with the patient he has been in contact with [facility] pharmacist, Joetta Manners. The patient confirms he recalls this communication. BSW explained that unfortunately, Ms. Julien Girt is the expert surrounding medication costs. BSW briefly discussed Medicaid as an option to assist with cost. The patient has never applied but reports a monthly income which doubles the eligibility income for Medicaid. The patient is not interested in completion of Medicaid application considering his income limit is substantially higher than the income threshold published by Medicaid.  Plan: BSW to contact the patient in the next two weeks to confirm receipt of [place] application.  Daneen Schick, BSW, CDP [facility] [phone]

## 2017-12-15 DIAGNOSIS — N401 Enlarged prostate with lower urinary tract symptoms: Secondary | ICD-10-CM | POA: Diagnosis not present

## 2017-12-15 DIAGNOSIS — C61 Malignant neoplasm of prostate: Secondary | ICD-10-CM | POA: Diagnosis not present

## 2017-12-15 DIAGNOSIS — R338 Other retention of urine: Secondary | ICD-10-CM | POA: Diagnosis not present

## 2017-12-18 ENCOUNTER — Other Ambulatory Visit: Payer: Self-pay

## 2017-12-18 NOTE — Patient Outreach (Signed)
[facility]) Care Management  12/18/2017  Charles Strong 09/17/1940 [id number]  Successful outreach to the patient on today's date. The patient is able to confirm receipt of mailed resources. The patient does not have any questions at this time. The patient reports he can not talk long "because at work". BSW asked the patient to contact [place] Management if assistance is requested for future needs. The patient stated "Okay I will, thank you".  BSW to perform a case closure as no other needs have been identified.  Daneen Schick, BSW, CDP [facility] [phone]

## 2017-12-22 DIAGNOSIS — R338 Other retention of urine: Secondary | ICD-10-CM | POA: Diagnosis not present

## 2017-12-22 DIAGNOSIS — N401 Enlarged prostate with lower urinary tract symptoms: Secondary | ICD-10-CM | POA: Diagnosis not present

## 2018-01-08 DIAGNOSIS — C61 Malignant neoplasm of prostate: Secondary | ICD-10-CM | POA: Diagnosis not present

## 2018-01-08 DIAGNOSIS — R338 Other retention of urine: Secondary | ICD-10-CM | POA: Diagnosis not present

## 2018-01-08 DIAGNOSIS — N401 Enlarged prostate with lower urinary tract symptoms: Secondary | ICD-10-CM | POA: Diagnosis not present

## 2018-02-10 DIAGNOSIS — C61 Malignant neoplasm of prostate: Secondary | ICD-10-CM | POA: Diagnosis not present

## 2018-02-17 DIAGNOSIS — N401 Enlarged prostate with lower urinary tract symptoms: Secondary | ICD-10-CM | POA: Diagnosis not present

## 2018-02-17 DIAGNOSIS — R3912 Poor urinary stream: Secondary | ICD-10-CM | POA: Diagnosis not present

## 2018-02-17 DIAGNOSIS — C61 Malignant neoplasm of prostate: Secondary | ICD-10-CM | POA: Diagnosis not present

## 2018-05-01 DIAGNOSIS — E1142 Type 2 diabetes mellitus with diabetic polyneuropathy: Secondary | ICD-10-CM | POA: Diagnosis not present

## 2018-05-01 DIAGNOSIS — E1165 Type 2 diabetes mellitus with hyperglycemia: Secondary | ICD-10-CM | POA: Diagnosis not present

## 2018-05-01 DIAGNOSIS — E063 Autoimmune thyroiditis: Secondary | ICD-10-CM | POA: Diagnosis not present

## 2018-05-12 DIAGNOSIS — C61 Malignant neoplasm of prostate: Secondary | ICD-10-CM | POA: Diagnosis not present

## 2018-07-22 DIAGNOSIS — C61 Malignant neoplasm of prostate: Secondary | ICD-10-CM | POA: Diagnosis not present

## 2018-08-17 DIAGNOSIS — C61 Malignant neoplasm of prostate: Secondary | ICD-10-CM | POA: Diagnosis not present

## 2018-08-17 DIAGNOSIS — N401 Enlarged prostate with lower urinary tract symptoms: Secondary | ICD-10-CM | POA: Diagnosis not present

## 2018-08-17 DIAGNOSIS — R3912 Poor urinary stream: Secondary | ICD-10-CM | POA: Diagnosis not present

## 2018-09-04 DIAGNOSIS — Z125 Encounter for screening for malignant neoplasm of prostate: Secondary | ICD-10-CM | POA: Diagnosis not present

## 2018-09-04 DIAGNOSIS — H524 Presbyopia: Secondary | ICD-10-CM | POA: Diagnosis not present

## 2018-09-04 DIAGNOSIS — H5203 Hypermetropia, bilateral: Secondary | ICD-10-CM | POA: Diagnosis not present

## 2018-09-04 DIAGNOSIS — Z0000 Encounter for general adult medical examination without abnormal findings: Secondary | ICD-10-CM | POA: Diagnosis not present

## 2018-09-04 DIAGNOSIS — E063 Autoimmune thyroiditis: Secondary | ICD-10-CM | POA: Diagnosis not present

## 2018-09-04 DIAGNOSIS — H52223 Regular astigmatism, bilateral: Secondary | ICD-10-CM | POA: Diagnosis not present

## 2018-09-04 DIAGNOSIS — H2513 Age-related nuclear cataract, bilateral: Secondary | ICD-10-CM | POA: Diagnosis not present

## 2018-09-04 DIAGNOSIS — E119 Type 2 diabetes mellitus without complications: Secondary | ICD-10-CM | POA: Diagnosis not present

## 2018-09-04 DIAGNOSIS — E1142 Type 2 diabetes mellitus with diabetic polyneuropathy: Secondary | ICD-10-CM | POA: Diagnosis not present

## 2018-09-04 DIAGNOSIS — Z1389 Encounter for screening for other disorder: Secondary | ICD-10-CM | POA: Diagnosis not present

## 2018-09-04 DIAGNOSIS — Z1211 Encounter for screening for malignant neoplasm of colon: Secondary | ICD-10-CM | POA: Diagnosis not present

## 2018-11-03 DIAGNOSIS — E1165 Type 2 diabetes mellitus with hyperglycemia: Secondary | ICD-10-CM | POA: Diagnosis not present

## 2018-11-03 DIAGNOSIS — E063 Autoimmune thyroiditis: Secondary | ICD-10-CM | POA: Diagnosis not present

## 2018-11-03 DIAGNOSIS — E1142 Type 2 diabetes mellitus with diabetic polyneuropathy: Secondary | ICD-10-CM | POA: Diagnosis not present

## 2018-12-04 DIAGNOSIS — H40013 Open angle with borderline findings, low risk, bilateral: Secondary | ICD-10-CM | POA: Diagnosis not present

## 2018-12-04 DIAGNOSIS — H40053 Ocular hypertension, bilateral: Secondary | ICD-10-CM | POA: Diagnosis not present

## 2018-12-04 DIAGNOSIS — H524 Presbyopia: Secondary | ICD-10-CM | POA: Diagnosis not present

## 2018-12-04 DIAGNOSIS — H52223 Regular astigmatism, bilateral: Secondary | ICD-10-CM | POA: Diagnosis not present

## 2018-12-04 DIAGNOSIS — H5203 Hypermetropia, bilateral: Secondary | ICD-10-CM | POA: Diagnosis not present

## 2018-12-17 DIAGNOSIS — Z03818 Encounter for observation for suspected exposure to other biological agents ruled out: Secondary | ICD-10-CM | POA: Diagnosis not present

## 2019-01-05 ENCOUNTER — Encounter: Payer: Self-pay | Admitting: Cardiovascular Disease

## 2019-01-05 ENCOUNTER — Telehealth (INDEPENDENT_AMBULATORY_CARE_PROVIDER_SITE_OTHER): Payer: PPO | Admitting: Cardiovascular Disease

## 2019-01-05 VITALS — BP 110/65 | HR 59 | Temp 97.6°F | Ht 70.0 in | Wt 133.0 lb

## 2019-01-05 DIAGNOSIS — E7800 Pure hypercholesterolemia, unspecified: Secondary | ICD-10-CM | POA: Diagnosis not present

## 2019-01-05 DIAGNOSIS — R001 Bradycardia, unspecified: Secondary | ICD-10-CM

## 2019-01-05 DIAGNOSIS — Q245 Malformation of coronary vessels: Secondary | ICD-10-CM | POA: Diagnosis not present

## 2019-01-05 NOTE — Patient Instructions (Signed)
Medication Instructions:  Your physician recommends that you continue on your current medications as directed. Please refer to the Current Medication list given to you today.  *If you need a refill on your cardiac medications before your next appointment, please call your pharmacy*  Lab Work: NONE  Testing/Procedures: NONE  Follow-Up: At [facility], you and your health needs are our priority.  As part of our continuing mission to provide you with exceptional heart care, we have created designated Provider Care Teams.  These Care Teams include your primary Cardiologist (physician) and Advanced Practice Providers (APPs -  Physician Assistants and Nurse Practitioners) who all work together to provide you with the care you need, when you need it.  Your next appointment:   12 months  You will receive a reminder letter in the mail two months in advance. If you don't receive a letter, please call our office to schedule the follow-up appointment.   The format for your next appointment:   In Person  Provider:   You may see DR [facility] or one of the following Advanced Practice Providers on your designated Care Team:    Kerin Ransom, PA-C  [place], [place]  Coletta Memos, [place]

## 2019-01-05 NOTE — Progress Notes (Signed)
Cardiology Office Note   Date:  01/05/2019  ID:  Charles Strong, DOB 06-05-1940, MRN [id number]  PCP:  [name], MD  Cardiologist:   Skeet Latch, MD   No chief complaint on file.    Patient ID: Charles Strong is a 78 y.o. male with myocardial bridge, diabetes mellitus type 2, hyperlipidemia, and Hashimotos's thyroiditis who presents for follow up.  History of Present Illness:  Charles Strong was initially seen 11/02/15 after an isolated episode of chest tightness that occurred while running. He runs 5 miles daily. He was referred for an exercise tolerance test on 11/30/14 that was concerning for ischemia. He exercised for 10 minutes on a Bruce protocol, which is 11.8 METs.  He subsequently declined cardiac catheterization and therefore had a cardiac CT angiography on 01/2015.  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 He was started on metoprolol. Charles Strong also had an episode of syncope.  However, this was thought to be due to taking both Imdur and Cialis at the same time.  He has not been interested in statin therapy.    Lately Charles Strong has been running two or three days per week.  He has cut back due to being busy. He still runs about 5 miles in 45-50 minutes. He continues to use the keto diet to avoid diabetic medication.  He limits carbohydrate intake but notices that if he eats any carbs his glucose increases significantly.  He tries to increase his vegetable intake.  The only time he has chest discomfort after eating certain foods.  He never has chest pain with running.  He has no chest pain and his breathing is stable.  Past Medical History:  Diagnosis Date  . Abnormal stress ECG 01/09/2015  . BPH (benign prostatic hyperplasia)   . Chest pain 11/02/2014  . Diabetes mellitus without complication ([place])   . Diabetes type 2, controlled ([place]) 11/02/2014  . Myocardial bridge  03/16/2015  . Thyroid disease     Past Surgical History:  Procedure Laterality Date  . TONSILLECTOMY       Current Outpatient Medications  Medication Sig Dispense Refill  . APPLE CIDER VINEGAR PO Take 30 mLs by mouth daily.     . Coenzyme Q10 (CO Q 10 PO) Take 200 mg by mouth daily.    . finasteride (PROSCAR) 5 MG tablet Take 5 mg by mouth daily.   11  . fluticasone (FLONASE) 50 MCG/ACT nasal spray PLACE 2 SPRAYS IN EACH NOSTRIL DAILY AS NEEDED FOR ALLERGIES 48 g 0  . glucose blood test strip 1 each by Other route 3 (three) times daily. Use as instructed    . guaiFENesin (MUCINEX) 600 MG 12 hr tablet Take 1 tablet (600 mg total) by mouth 2 (two) times daily as needed for cough (OR CONGESTION). 60 tablet 1  . metFORMIN (GLUCOPHAGE-XR) 500 MG 24 hr tablet Take 500 mg by mouth daily with breakfast.   4  . metoprolol tartrate (LOPRESSOR) 25 MG tablet Take 12.5 mg by mouth 2 (two) times daily as needed.    Marland Kitchen NITROSTAT 0.4 MG SL tablet PLACE 1 TABLET UNDER THE TONGUE EVERY 5 MINUTES AS NEEDED FOR CHEST PAIN 25 tablet 5  . Potassium 99 MG TABS Take 4 tablets by mouth daily.    . Selenium 200 MCG TABS Take 200 mcg by mouth daily.    . silodosin (RAPAFLO)  8 MG CAPS capsule Take 8 mg by mouth daily with breakfast.   11  . thiamine (VITAMIN B-1) 100 MG tablet Take 100 mg by mouth daily.    . TURMERIC PO Take 400 mg by mouth daily.     No current facility-administered medications for this visit.     Allergies:   Calcium fortified cookie [nutritional supplements]    Social History:  The patient  reports that he has never smoked. He has never used smokeless tobacco. He reports that he does not drink alcohol or use drugs.   Family History:  The patient's family history is not on file.    ROS:  Please see the history of present illness.  Otherwise, review of systems are positive for none.   All other systems are reviewed and negative.    PHYSICAL EXAM: VS:  BP 110/65   Pulse (!) 59   Temp  97.6 F (36.4 C)   Ht 5\' 10"  (1.778 m)   Wt 133 lb (60.3 kg)   BMI 19.08 kg/m  , BMI Body mass index is 19.08 kg/m. GENERAL:  Well appearing.  No acute distress. HEENT: Pupils equal round and reactive, fundi not visualized, oral mucosa unremarkable NECK:  No jugular venous distention LUNGS:  Respiration unlabored. EXT:  2 plus pulses throughout, no edema, no cyanosis no clubbing SKIN:  No rashes no nodules NEURO:  Cranial nerves II through XII grossly intact, motor grossly intact throughout PSYCH:  Cognitively intact, oriented to person place and time  EKG:  EKG is not ordered today. The ekg ordered 07/27/15 demonstrates sinus bradycardia at 54 bpm. 09/03/16: sinus bradycardia. Rate 77 bpm. 11/07/17: Sinus bradycardia.  Rate 56 bpm.    11/30/14 ETT:  Blood pressure demonstrated a hypertensive response to exercise.  Horizontal ST segment depression ST segment depression was noted during stress in the II, III and aVF leads, beginning at 10 minutes of stress, and returning to baseline after less than 1 minute of recovery.  Recent Labs: No results found for requested labs within last 8760 hours.    Lipid Panel    Component Value Date/Time   CHOL 163 12/19/2014 0913   TRIG 73 12/19/2014 0913   HDL 63 12/19/2014 0913   CHOLHDL 2.6 12/19/2014 0913   VLDL 15 12/19/2014 0913   LDLCALC 85 12/19/2014 0913   09/01/2017: Total cholesterol 192, triglycerides 122, HDL 64, LDL 104 Potassium 4.5, creatinine 0.76 TSH 2.12 Hemoglobin A1c 5.8%    Wt Readings from Last 3 Encounters:  01/05/19 133 lb (60.3 kg)  11/07/17 134 lb 12.8 oz (61.1 kg)  04/08/17 148 lb (67.1 kg)    Cardiac CT angiography 01/27/15: IMPRESSION: 1. Coronary calcium score of 0. This was 0 percentile for age and sex matched control. 2. Normal coronary origin. Right dominance. 3. Minimal non-calcified plaque. 4. 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 This is most probably culprit of patient's symptoms  Other  studies Reviewed: Additional studies/ records that were reviewed today include: n/a. Review of the above records demonstrates:  Please see elsewhere in the note.  n/a   ASSESSMENT AND PLAN:  # Chest pain, myocardial bridge:  Charles Strong Is no longer experiencing chest pain.  He exercises regularly without any ischemia.  # Hyperlipidemia: Charles Strong continues to control his lipids with diet and exercise.  We discussed avoiding high fat foods.  He will have lipids checked with his PCP next month.  # Bradycardia: Asymptomatic.  Likely due to chronic  exercise.    Current medicines are reviewed at length with the patient today.  The patient does not have concerns regarding medicines.  The following changes have been made:  no change  Labs/ tests ordered today include:   No orders of the defined types were placed in this encounter.   Disposition:   FU with Dr. Jonelle Sidle C. Oval Linsey in 1 year.   Signed, Skeet Latch, MD  01/05/2019 9:05 AM    [place]

## 2019-02-01 DIAGNOSIS — C61 Malignant neoplasm of prostate: Secondary | ICD-10-CM | POA: Diagnosis not present

## 2019-02-08 DIAGNOSIS — N401 Enlarged prostate with lower urinary tract symptoms: Secondary | ICD-10-CM | POA: Diagnosis not present

## 2019-02-08 DIAGNOSIS — C61 Malignant neoplasm of prostate: Secondary | ICD-10-CM | POA: Diagnosis not present

## 2019-02-08 DIAGNOSIS — R338 Other retention of urine: Secondary | ICD-10-CM | POA: Diagnosis not present

## 2019-09-06 DIAGNOSIS — H524 Presbyopia: Secondary | ICD-10-CM | POA: Diagnosis not present

## 2019-09-06 DIAGNOSIS — H5203 Hypermetropia, bilateral: Secondary | ICD-10-CM | POA: Diagnosis not present

## 2019-09-06 DIAGNOSIS — H52223 Regular astigmatism, bilateral: Secondary | ICD-10-CM | POA: Diagnosis not present

## 2019-09-06 DIAGNOSIS — H2513 Age-related nuclear cataract, bilateral: Secondary | ICD-10-CM | POA: Diagnosis not present

## 2019-09-06 DIAGNOSIS — E119 Type 2 diabetes mellitus without complications: Secondary | ICD-10-CM | POA: Diagnosis not present

## 2019-09-14 DIAGNOSIS — E063 Autoimmune thyroiditis: Secondary | ICD-10-CM | POA: Diagnosis not present

## 2019-09-14 DIAGNOSIS — E785 Hyperlipidemia, unspecified: Secondary | ICD-10-CM | POA: Diagnosis not present

## 2019-09-14 DIAGNOSIS — Z7984 Long term (current) use of oral hypoglycemic drugs: Secondary | ICD-10-CM | POA: Diagnosis not present

## 2019-09-14 DIAGNOSIS — Z1389 Encounter for screening for other disorder: Secondary | ICD-10-CM | POA: Diagnosis not present

## 2019-09-14 DIAGNOSIS — Z0000 Encounter for general adult medical examination without abnormal findings: Secondary | ICD-10-CM | POA: Diagnosis not present

## 2019-09-14 DIAGNOSIS — R195 Other fecal abnormalities: Secondary | ICD-10-CM | POA: Diagnosis not present

## 2019-09-14 DIAGNOSIS — N401 Enlarged prostate with lower urinary tract symptoms: Secondary | ICD-10-CM | POA: Diagnosis not present

## 2019-09-14 DIAGNOSIS — N529 Male erectile dysfunction, unspecified: Secondary | ICD-10-CM | POA: Diagnosis not present

## 2019-09-14 DIAGNOSIS — C61 Malignant neoplasm of prostate: Secondary | ICD-10-CM | POA: Diagnosis not present

## 2019-09-14 DIAGNOSIS — E1142 Type 2 diabetes mellitus with diabetic polyneuropathy: Secondary | ICD-10-CM | POA: Diagnosis not present

## 2019-09-14 DIAGNOSIS — E039 Hypothyroidism, unspecified: Secondary | ICD-10-CM | POA: Diagnosis not present

## 2019-09-14 DIAGNOSIS — I10 Essential (primary) hypertension: Secondary | ICD-10-CM | POA: Diagnosis not present

## 2019-09-28 ENCOUNTER — Other Ambulatory Visit: Payer: Self-pay | Admitting: Internal Medicine

## 2019-09-28 DIAGNOSIS — R197 Diarrhea, unspecified: Secondary | ICD-10-CM | POA: Diagnosis not present

## 2019-09-28 DIAGNOSIS — R1033 Periumbilical pain: Secondary | ICD-10-CM | POA: Diagnosis not present

## 2019-09-28 DIAGNOSIS — Z7984 Long term (current) use of oral hypoglycemic drugs: Secondary | ICD-10-CM | POA: Diagnosis not present

## 2019-09-28 DIAGNOSIS — R630 Anorexia: Secondary | ICD-10-CM | POA: Diagnosis not present

## 2019-09-28 DIAGNOSIS — E1142 Type 2 diabetes mellitus with diabetic polyneuropathy: Secondary | ICD-10-CM | POA: Diagnosis not present

## 2019-10-01 DIAGNOSIS — R197 Diarrhea, unspecified: Secondary | ICD-10-CM | POA: Diagnosis not present

## 2019-10-11 ENCOUNTER — Ambulatory Visit
Admission: RE | Admit: 2019-10-11 | Discharge: 2019-10-11 | Disposition: A | Discharge status: Home / Self Care | Payer: PPO | Source: Ambulatory Visit | Attending: Internal Medicine | Admitting: Internal Medicine

## 2019-10-11 DIAGNOSIS — E063 Autoimmune thyroiditis: Secondary | ICD-10-CM | POA: Diagnosis not present

## 2019-10-11 DIAGNOSIS — R197 Diarrhea, unspecified: Secondary | ICD-10-CM | POA: Diagnosis not present

## 2019-10-11 DIAGNOSIS — R140 Abdominal distension (gaseous): Secondary | ICD-10-CM | POA: Diagnosis not present

## 2019-10-11 DIAGNOSIS — Z7984 Long term (current) use of oral hypoglycemic drugs: Secondary | ICD-10-CM | POA: Diagnosis not present

## 2019-10-11 DIAGNOSIS — N200 Calculus of kidney: Secondary | ICD-10-CM | POA: Diagnosis not present

## 2019-10-11 DIAGNOSIS — E1142 Type 2 diabetes mellitus with diabetic polyneuropathy: Secondary | ICD-10-CM | POA: Diagnosis not present

## 2019-10-11 DIAGNOSIS — I700 Atherosclerosis of aorta: Secondary | ICD-10-CM | POA: Diagnosis not present

## 2019-10-11 DIAGNOSIS — R1033 Periumbilical pain: Secondary | ICD-10-CM

## 2019-10-11 IMAGING — CT CT ABDOMEN W/ CM
1 of 3 series · 9 of 32 positions shown, 15 images · IV contrast (APPLIED)
Comparison: [DATE] from [HOSPITAL]

CLINICAL DATA: Paraumbilical abdominal pain for several months.
Bloating. Diarrhea.

EXAM:
CT ABDOMEN WITH CONTRAST
TECHNIQUE: Multidetector CT imaging of the abdomen was performed using the
standard protocol following bolus administration of intravenous
contrast.
CONTRAST:  100mL [KO] IOPAMIDOL ([KO]) INJECTION 61%

[Series 2: abdroutine 5.0 i40s 1 · axial · 0.68mm/px · z∈[+688,+888]mm · 9 of 51 slices shown, 15 images]
[im 6/51  soft-tissue]
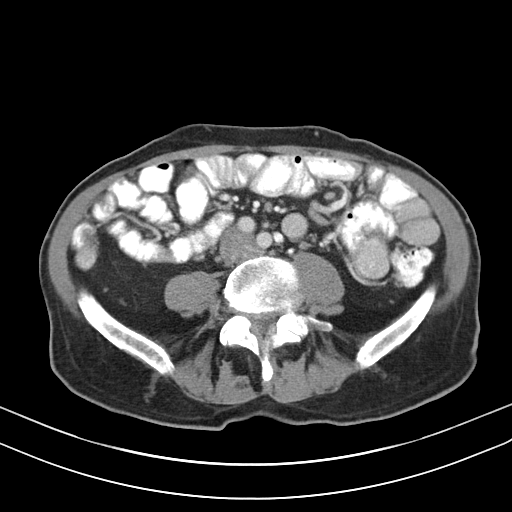
[im 6/51  bone]
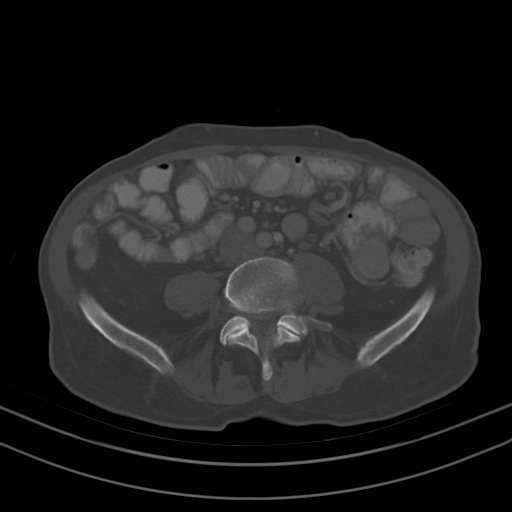
[im 11/51  soft-tissue]
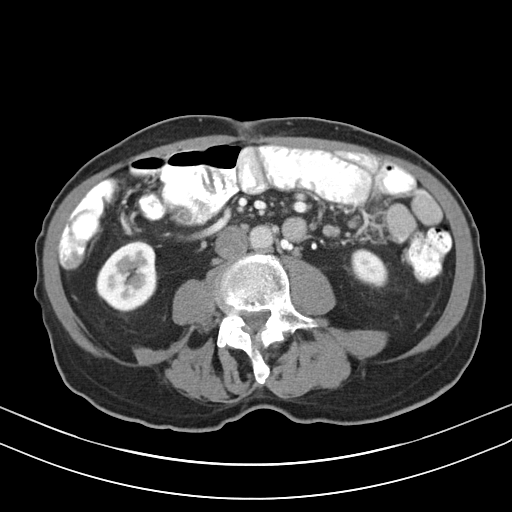
[im 16/51  soft-tissue]
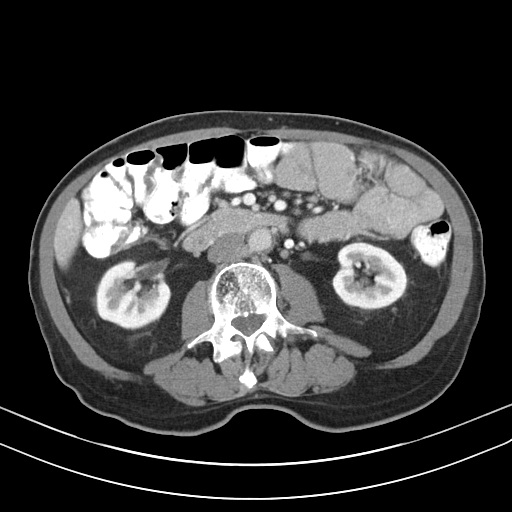
[im 21/51  soft-tissue]
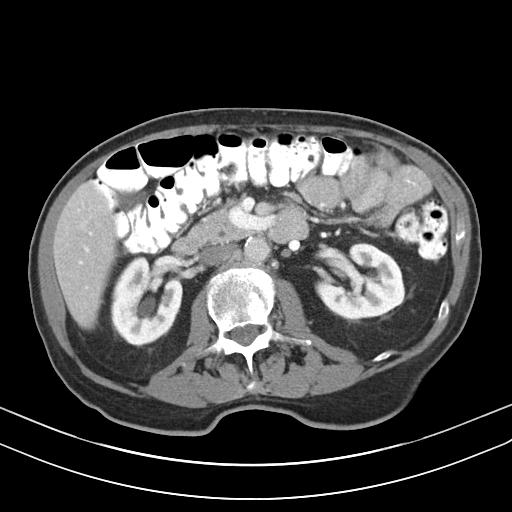
[im 26/51  soft-tissue]
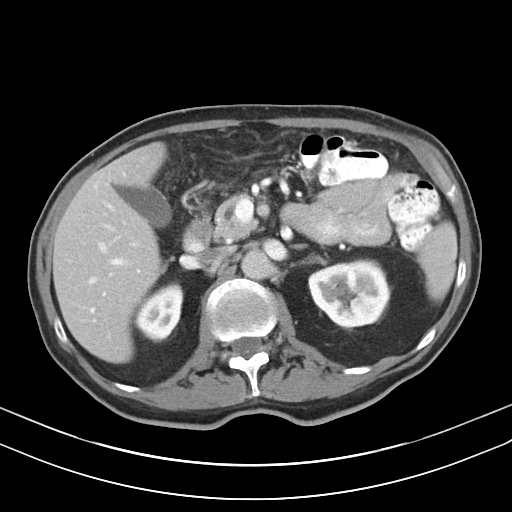
[im 31/51  soft-tissue]
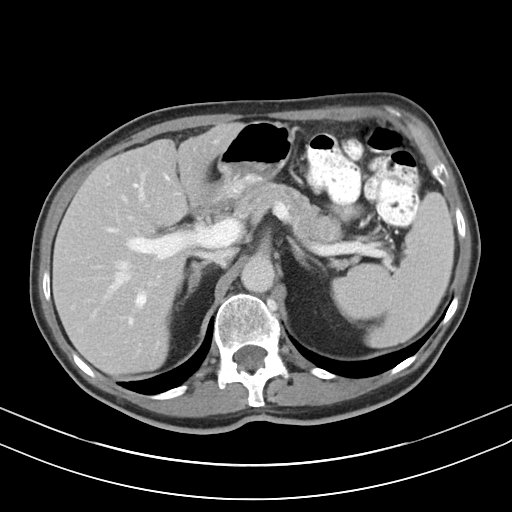
[im 31/51  lung]
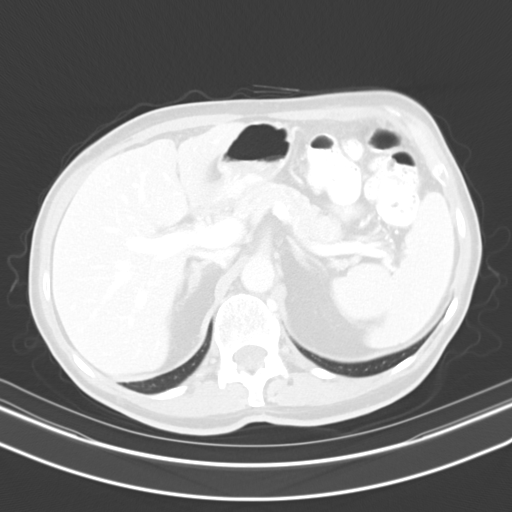
[im 36/51  soft-tissue]
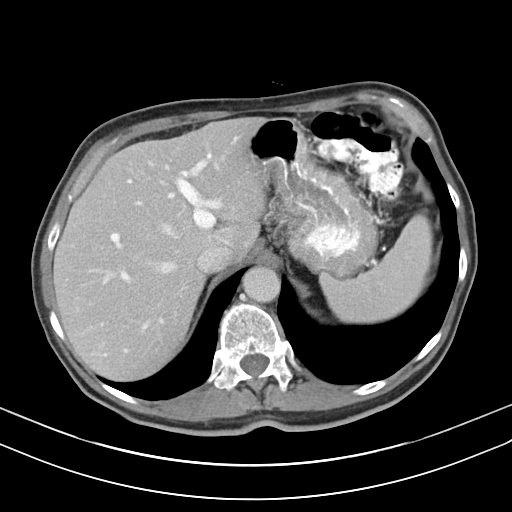
[im 36/51  lung]
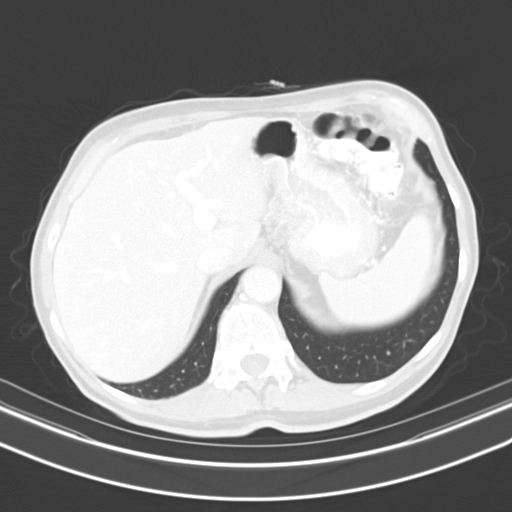
[im 41/51  soft-tissue]
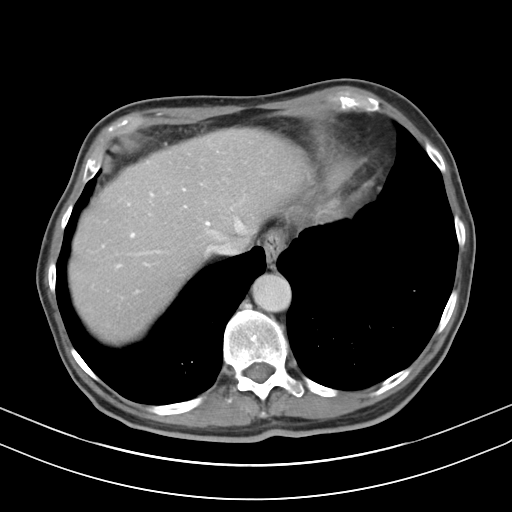
[im 41/51  lung]
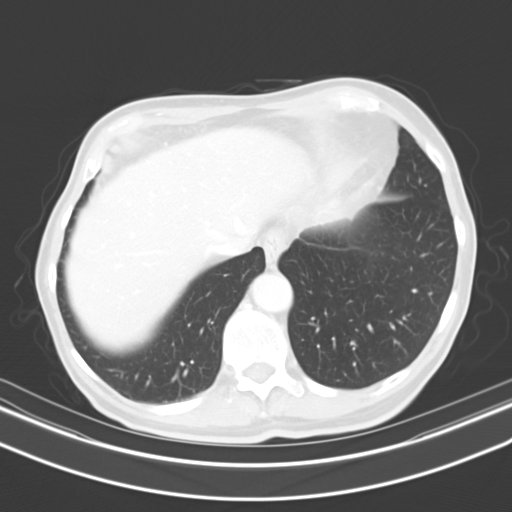
[im 46/51  soft-tissue]
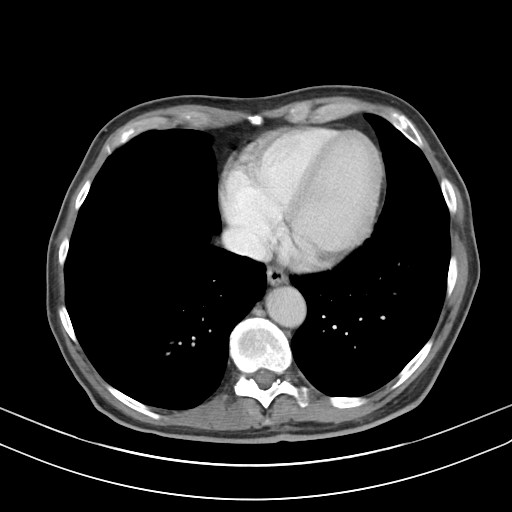
[im 46/51  lung]
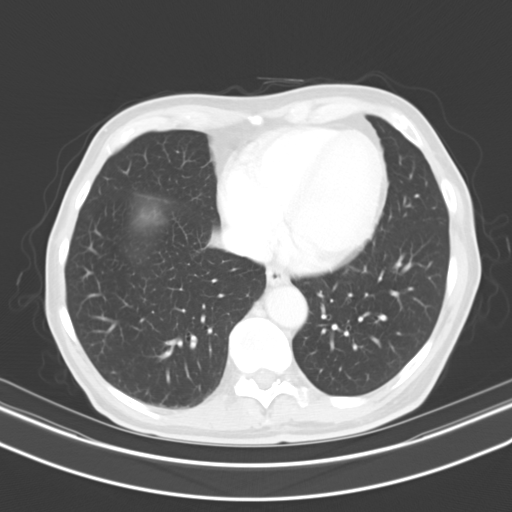
[im 46/51  bone]
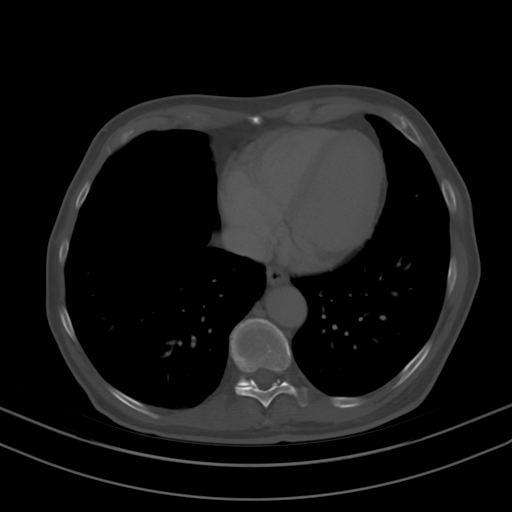

[9 of 32 positions shown; findings below may reference images not displayed]

FINDINGS: Lower chest: No acute findings.

Hepatobiliary: No hepatic masses identified. Gallbladder is
unremarkable. No evidence of biliary ductal dilatation.

Pancreas:  No mass or inflammatory changes.

Spleen:  Within normal limits in size and appearance.

Adrenals/Urinary Tract: No masses identified. 7 mm calculus seen in
the lower pole of the left kidney. No evidence of hydronephrosis. No
evidence of hydronephrosis.

Stomach/Bowel: Visualized portion unremarkable.

Vascular/Lymphatic: No pathologically enlarged lymph nodes
identified. No abdominal aortic aneurysm. Aortic atherosclerosis
noted.

Other:  None.  No evidence of ventral abdominal wall hernia or mass.

Musculoskeletal:  No suspicious bone lesions identified.
IMPRESSION: No acute findings.

7 mm left renal calculus. No evidence of hydronephrosis.

Aortic Atherosclerosis ([KO]-[KO]).

## 2019-10-11 MED ORDER — IOPAMIDOL (ISOVUE-300) INJECTION 61%
100.0000 mL | Freq: Once | INTRAVENOUS | Status: AC | PRN
  Administered 2019-10-11: 100 mL via INTRAVENOUS

## 2020-01-18 ENCOUNTER — Encounter: Payer: Self-pay | Admitting: Cardiovascular Disease

## 2020-01-18 ENCOUNTER — Ambulatory Visit: Payer: PPO | Admitting: Cardiovascular Disease

## 2020-01-18 ENCOUNTER — Other Ambulatory Visit: Payer: Self-pay

## 2020-01-18 VITALS — BP 124/70 | HR 55 | Ht 70.0 in | Wt 142.2 lb

## 2020-01-18 DIAGNOSIS — Q245 Malformation of coronary vessels: Secondary | ICD-10-CM

## 2020-01-18 DIAGNOSIS — E7800 Pure hypercholesterolemia, unspecified: Secondary | ICD-10-CM

## 2020-01-18 NOTE — Progress Notes (Signed)
Cardiology Office Note   Date:  01/18/2020  ID:  Charles Strong, DOB 1940-08-30, MRN [id number]  PCP:  [name], MD  Cardiologist:   Skeet Latch, MD   No chief complaint on file.    History of Present Illness:  Charles Strong is a 79 y.o. male with myocardial bridge, diabetes mellitus type 2, hyperlipidemia, and Hashimotos's thyroiditis who presents for follow up.  Charles Strong was initially seen 11/02/15 after an isolated episode of chest tightness that occurred while running. He runs 5 miles daily. He was referred for an exercise tolerance test on 11/30/14 that was concerning for ischemia. He exercised for 10 minutes on a Bruce protocol, which is 11.8 METs.  He subsequently declined cardiac catheterization and therefore had a cardiac CT angiography on 01/2015.  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 He was started on metoprolol. Charles Strong also had an episode of syncope.  However, this was thought to be due to taking both Imdur and Cialis at the same time.  He has not been interested in statin therapy.    Charles Strong continues to run every other day for 5 miles.  He feels good with exercise and has no exertional chest pain or shortness of breath.  He denies any lower extremity edema, orthopnea, or PND.  He has only needed to use metoprolol 3 or 4 times in the last year and has not been with exertion.  He thinks it may have been indigestion but whenever he feels something funny in his chest he just takes it.  Overall he continues to do well.  He has been trying to follow the keto diet and his weight has been stable.  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  He stays busy doing multiple part-time jobs.  He is working with his PCP regarding GI issues.  He has episodic diarrhea.  He has been working on trying to eliminate different foods.   Past Medical History:  Diagnosis  Date  . Abnormal stress ECG 01/09/2015  . BPH (benign prostatic hyperplasia)   . Chest pain 11/02/2014  . Diabetes mellitus without complication ([place])   . Diabetes type 2, controlled ([place]) 11/02/2014  . 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 Thyroid disease     Past Surgical History:  Procedure Laterality Date  . TONSILLECTOMY       Current Outpatient Medications  Medication Sig Dispense Refill  . APPLE CIDER VINEGAR PO Take 30 mLs by mouth daily.     . Coenzyme Q10 (CO Q 10 PO) Take 200 mg by mouth daily.    . finasteride (PROSCAR) 5 MG tablet Take 5 mg by mouth daily.   11  . fluticasone (FLONASE) 50 MCG/ACT nasal spray PLACE 2 SPRAYS IN EACH NOSTRIL DAILY AS NEEDED FOR ALLERGIES 48 g 0  . glipiZIDE (GLUCOTROL XL) 5 MG 24 hr tablet Take 5 mg by mouth daily.    Marland Kitchen glucose blood test strip 1 each by Other route 3 (three) times daily. Use as instructed    . guaiFENesin (MUCINEX) 600 MG 12 hr tablet Take 1 tablet (600 mg total) by mouth 2 (two) times daily as needed for cough (OR CONGESTION). 60 tablet 1  . ivermectin (STROMECTOL) 3 MG TABS tablet Take by mouth.    . metFORMIN (GLUCOPHAGE-XR) 500 MG 24 hr tablet Take 500 mg by mouth daily with breakfast.  4  . metoprolol tartrate (LOPRESSOR) 25 MG tablet Take 12.5 mg by mouth 2 (two) times daily as needed.    Marland Kitchen NITROSTAT 0.4 MG SL tablet PLACE 1 TABLET UNDER THE TONGUE EVERY 5 MINUTES AS NEEDED FOR CHEST PAIN 25 tablet 5  . Potassium 99 MG TABS Take 4 tablets by mouth daily.    . Selenium 200 MCG TABS Take 200 mcg by mouth daily.    . silodosin (RAPAFLO) 8 MG CAPS capsule Take 8 mg by mouth daily with breakfast.   11  . thiamine (VITAMIN B-1) 100 MG tablet Take 100 mg by mouth daily.    . TURMERIC PO Take 400 mg by mouth daily.     No current facility-administered medications for this visit.    Allergies:   Calcium fortified cookie [nutritional supplements] and Eggs or egg-derived products    Social History:  The patient  reports that he  has never smoked. He has never used smokeless tobacco. He reports that he does not drink alcohol and does not use drugs.   Family History:  The patient's family history is not on file.    ROS:  Please see the history of present illness.  Otherwise, review of systems are positive for none.   All other systems are reviewed and negative.    PHYSICAL EXAM: VS:  BP 124/70 (BP Location: Right Arm, Patient Position: Sitting)   Pulse (!) 55   Ht 5\' 10"  (1.778 m)   Wt 142 lb 3.2 oz (64.5 kg)   BMI 20.40 kg/m  , BMI Body mass index is 20.4 kg/m. GENERAL:  Well appearing HEENT: Pupils equal round and reactive, fundi not visualized, oral mucosa unremarkable NECK:  No jugular venous distention, waveform within normal limits, carotid upstroke brisk and symmetric, no bruits LUNGS:  Clear to auscultation bilaterally HEART:  Bradycardic.  Regular rhythm.  PMI not displaced or sustained,S1 and S2 within normal limits, no S3, no S4, no clicks, no rubs, no murmurs ABD:  Flat, positive bowel sounds normal in frequency in pitch, no bruits, no rebound, no guarding, no midline pulsatile mass, no hepatomegaly, no splenomegaly EXT:  2 plus pulses throughout, no edema, no cyanosis no clubbing SKIN:  No rashes no nodules NEURO:  Cranial nerves II through XII grossly intact, motor grossly intact throughout PSYCH:  Cognitively intact, oriented to person place and time   EKG:  EKG is ordered today. The ekg ordered 07/27/15 demonstrates sinus bradycardia at 54 bpm. 09/03/16: sinus bradycardia. Rate 77 bpm. 11/07/17: Sinus bradycardia.  Rate 56 bpm.   01/18/20: Sinus bradycardia.  Rate 55 bpm.  11/30/14 ETT:  Blood pressure demonstrated a hypertensive response to exercise.  Horizontal ST segment depression ST segment depression was noted during stress in the II, III and aVF leads, beginning at 10 minutes of stress, and returning to baseline after less than 1 minute of recovery.  Recent Labs: No results found for  requested labs within last 8760 hours.    Lipid Panel    Component Value Date/Time   CHOL 163 12/19/2014 0913   TRIG 73 12/19/2014 0913   HDL 63 12/19/2014 0913   CHOLHDL 2.6 12/19/2014 0913   VLDL 15 12/19/2014 0913   LDLCALC 85 12/19/2014 0913   09/01/2017: Total cholesterol 192, triglycerides 122, HDL 64, LDL 104 Potassium 4.5, creatinine 0.76 TSH 2.12 Hemoglobin A1c 5.8%  01/18/2020: Total cholesterol 180, triglycerides 58, HDL 69, LDL 100 Hemoglobin 14.4 Creatinine 0.92, potassium 4.2 TSH 2.4 Hemoglobin A1c 5.9%  Wt Readings from Last 3 Encounters:  01/18/20 142 lb 3.2 oz (64.5 kg)  01/05/19 133 lb (60.3 kg)  11/07/17 134 lb 12.8 oz (61.1 kg)    Cardiac CT angiography 01/27/15: IMPRESSION: 1. Coronary calcium score of 0. This was 0 percentile for age and sex matched control. 2. Normal coronary origin. Right dominance. 3. Minimal non-calcified plaque. 4. 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 This is most probably culprit of patient's symptoms  Other studies Reviewed: Additional studies/ records that were reviewed today include: n/a. Review of the above records demonstrates:  Please see elsewhere in the note.  n/a   ASSESSMENT AND PLAN:  # Chest pain, myocardial bridge:  Charles Strong Is no longer experiencing chest pain.  He continues to exercise regularly without symptoms.  He has metoprolol but only uses it sparingly.  # Hyperlipidemia: Charles Strong continues to control his lipids with diet and exercise.  We discussed avoiding high fat foods.  LDL was 100 on 08/2019.  # Bradycardia: Asymptomatic.  Likely due to chronic exercise.    Current medicines are reviewed at length with the patient today.  The patient does not have concerns regarding medicines.  The following changes have been made:  no change  Labs/ tests ordered today include:   No orders of the defined types were placed in this encounter.   Disposition:   FU with Dr. Jonelle Sidle C.  Oval Linsey as needed   Signed, Skeet Latch, MD  01/18/2020 8:22 AM    [place]

## 2020-01-18 NOTE — Patient Instructions (Signed)
Medication Instructions:  ?Your physician recommends that you continue on your current medications as directed. Please refer to the Current Medication list given to you today.  ? ?Labwork: ?NONE ? ?Testing/Procedures: ?NONE ? ?Follow-Up: ?AS NEEDED  ? ?  ?

## 2020-02-02 DIAGNOSIS — I700 Atherosclerosis of aorta: Secondary | ICD-10-CM | POA: Diagnosis not present

## 2020-02-02 DIAGNOSIS — E1165 Type 2 diabetes mellitus with hyperglycemia: Secondary | ICD-10-CM | POA: Diagnosis not present

## 2020-02-02 DIAGNOSIS — E1142 Type 2 diabetes mellitus with diabetic polyneuropathy: Secondary | ICD-10-CM | POA: Diagnosis not present

## 2020-02-02 DIAGNOSIS — Z7984 Long term (current) use of oral hypoglycemic drugs: Secondary | ICD-10-CM | POA: Diagnosis not present

## 2020-02-15 DIAGNOSIS — C61 Malignant neoplasm of prostate: Secondary | ICD-10-CM | POA: Diagnosis not present

## 2020-04-21 DIAGNOSIS — Z7984 Long term (current) use of oral hypoglycemic drugs: Secondary | ICD-10-CM | POA: Diagnosis not present

## 2020-04-21 DIAGNOSIS — E1142 Type 2 diabetes mellitus with diabetic polyneuropathy: Secondary | ICD-10-CM | POA: Diagnosis not present

## 2020-04-21 DIAGNOSIS — E063 Autoimmune thyroiditis: Secondary | ICD-10-CM | POA: Diagnosis not present

## 2020-04-24 DIAGNOSIS — R3912 Poor urinary stream: Secondary | ICD-10-CM | POA: Diagnosis not present

## 2020-04-24 DIAGNOSIS — N401 Enlarged prostate with lower urinary tract symptoms: Secondary | ICD-10-CM | POA: Diagnosis not present

## 2020-04-24 DIAGNOSIS — C61 Malignant neoplasm of prostate: Secondary | ICD-10-CM | POA: Diagnosis not present

## 2020-08-08 DIAGNOSIS — Z20822 Contact with and (suspected) exposure to covid-19: Secondary | ICD-10-CM | POA: Diagnosis not present

## 2020-08-08 DIAGNOSIS — Z1152 Encounter for screening for COVID-19: Secondary | ICD-10-CM | POA: Diagnosis not present

## 2020-09-14 DIAGNOSIS — Z7984 Long term (current) use of oral hypoglycemic drugs: Secondary | ICD-10-CM | POA: Diagnosis not present

## 2020-09-14 DIAGNOSIS — E063 Autoimmune thyroiditis: Secondary | ICD-10-CM | POA: Diagnosis not present

## 2020-09-14 DIAGNOSIS — E1142 Type 2 diabetes mellitus with diabetic polyneuropathy: Secondary | ICD-10-CM | POA: Diagnosis not present

## 2020-09-14 DIAGNOSIS — R197 Diarrhea, unspecified: Secondary | ICD-10-CM | POA: Diagnosis not present

## 2020-09-14 DIAGNOSIS — Z0000 Encounter for general adult medical examination without abnormal findings: Secondary | ICD-10-CM | POA: Diagnosis not present

## 2020-09-14 DIAGNOSIS — Z1389 Encounter for screening for other disorder: Secondary | ICD-10-CM | POA: Diagnosis not present

## 2020-09-14 DIAGNOSIS — Z1159 Encounter for screening for other viral diseases: Secondary | ICD-10-CM | POA: Diagnosis not present

## 2020-10-20 DIAGNOSIS — Z7984 Long term (current) use of oral hypoglycemic drugs: Secondary | ICD-10-CM | POA: Diagnosis not present

## 2020-10-20 DIAGNOSIS — E063 Autoimmune thyroiditis: Secondary | ICD-10-CM | POA: Diagnosis not present

## 2020-10-20 DIAGNOSIS — E1142 Type 2 diabetes mellitus with diabetic polyneuropathy: Secondary | ICD-10-CM | POA: Diagnosis not present

## 2020-10-20 DIAGNOSIS — R3915 Urgency of urination: Secondary | ICD-10-CM | POA: Diagnosis not present

## 2021-02-03 ENCOUNTER — Encounter (HOSPITAL_COMMUNITY): Payer: Self-pay | Admitting: Emergency Medicine

## 2021-02-03 ENCOUNTER — Emergency Department (HOSPITAL_COMMUNITY)
Admission: EM | Admit: 2021-02-03 | Discharge: 2021-02-03 | Disposition: A | Discharge status: Home / Self Care | Payer: PPO | Attending: Emergency Medicine | Admitting: Emergency Medicine

## 2021-02-03 ENCOUNTER — Other Ambulatory Visit: Payer: Self-pay

## 2021-02-03 DIAGNOSIS — R338 Other retention of urine: Secondary | ICD-10-CM

## 2021-02-03 DIAGNOSIS — Z7984 Long term (current) use of oral hypoglycemic drugs: Secondary | ICD-10-CM | POA: Diagnosis not present

## 2021-02-03 DIAGNOSIS — R339 Retention of urine, unspecified: Secondary | ICD-10-CM | POA: Insufficient documentation

## 2021-02-03 DIAGNOSIS — E119 Type 2 diabetes mellitus without complications: Secondary | ICD-10-CM | POA: Insufficient documentation

## 2021-02-03 LAB — URINALYSIS, ROUTINE W REFLEX MICROSCOPIC
Bacteria, UA: NONE SEEN
Bilirubin Urine: NEGATIVE
Glucose, UA: 50 mg/dL — AB
Ketones, ur: NEGATIVE mg/dL
Leukocytes,Ua: NEGATIVE
Nitrite: NEGATIVE
Protein, ur: NEGATIVE mg/dL
Specific Gravity, Urine: 1.004 — ABNORMAL LOW (ref 1.005–1.030)
pH: 5 (ref 5.0–8.0)

## 2021-02-03 MED ORDER — LIDOCAINE HCL URETHRAL/MUCOSAL 2 % EX GEL: 1.0000 "application " | Freq: Once | CUTANEOUS | Status: DC | PRN

## 2021-02-03 MED ORDER — LIDOCAINE HCL URETHRAL/MUCOSAL 2 % EX PRSY
1.0000 "application " | PREFILLED_SYRINGE | Freq: Once | CUTANEOUS | Status: DC | PRN
  Filled 2021-02-03: qty 5

## 2021-02-03 NOTE — ED Provider Notes (Signed)
[facility] DEPT Provider Note   CSN: [id number] Arrival date & time: 02/03/21  0107     History Chief Complaint  Patient presents with   Urinary Retention    Charles Strong is a 80 y.o. male.  The history is provided by the patient.  He has history of diabetes, hyperlipidemia, benign prostatic hypertrophy and comes in with inability to urinate since 8 PM.  He has had 2 prior episodes of urinary retention.   Past Medical History:  Diagnosis Date   Abnormal stress ECG 01/09/2015   BPH (benign prostatic hyperplasia)    Chest pain 11/02/2014   Diabetes mellitus without complication ([place])    Diabetes type 2, controlled ([place]) 11/02/2014   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   Thyroid disease     Patient Active Problem List   Diagnosis Date Noted   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   Abnormal stress ECG 01/09/2015   Chest pain 11/02/2014   Hyperlipidemia 11/02/2014   Prediabetes 11/02/2014    Past Surgical History:  Procedure Laterality Date   TONSILLECTOMY         No family history on file.  Social History   Tobacco Use   Smoking status: Never   Smokeless tobacco: Never  Substance Use Topics   Alcohol use: No   Drug use: No    Home Medications Prior to Admission medications   Medication Sig Start Date End Date Taking? Authorizing Provider  APPLE CIDER VINEGAR PO Take 30 mLs by mouth daily.     [provider]  Coenzyme Q10 (CO Q 10 PO) Take 200 mg by mouth daily.    [provider]  finasteride (PROSCAR) 5 MG tablet Take 5 mg by mouth daily.  11/20/17   [provider]  fluticasone (FLONASE) 50 MCG/ACT nasal spray PLACE 2 SPRAYS IN EACH NOSTRIL DAILY AS NEEDED FOR ALLERGIES 11/07/17   Skeet Latch, MD  glipiZIDE (GLUCOTROL XL) 5 MG 24 hr tablet Take 5 mg by mouth daily. 12/06/19   [provider]  glucose blood test strip 1 each by Other route 3 (three) times daily. Use as instructed    [provider]  guaiFENesin (MUCINEX) 600 MG 12 hr tablet Take 1 tablet (600 mg total) by mouth 2 (two) times daily as needed for cough (OR CONGESTION). 11/07/17   Skeet Latch, MD  ivermectin (STROMECTOL) 3 MG TABS tablet Take by mouth. 11/05/19   [provider]  metFORMIN (GLUCOPHAGE-XR) 500 MG 24 hr tablet Take 500 mg by mouth daily with breakfast.  10/21/14   [provider]  metoprolol tartrate (LOPRESSOR) 25 MG tablet Take 12.5 mg by mouth 2 (two) times daily as needed.    [provider]  NITROSTAT 0.4 MG SL tablet PLACE 1 TABLET UNDER THE TONGUE EVERY 5 MINUTES AS NEEDED FOR CHEST PAIN 01/22/16   Skeet Latch, MD  Potassium 99 MG TABS Take 4 tablets by mouth daily.    [provider]  Selenium 200 MCG TABS Take 200 mcg by mouth daily.    [provider]  silodosin (RAPAFLO) 8 MG CAPS capsule Take 8 mg by mouth daily with breakfast.  11/20/17   [provider]  thiamine (VITAMIN B-1) 100 MG tablet Take 100 mg by mouth daily.    [provider]  TURMERIC PO Take 400 mg by mouth daily.    [provider]    Allergies    Calcium fortified cookie [nutritional supplements] and Eggs or egg-derived products  Review of Systems   Review of Systems  All other systems reviewed and are negative.  Physical Exam Updated Vital Signs BP (!) 166/99 (BP Location: Left Arm)   Pulse (!) 108   Temp 97.7 F (36.5 C) (Oral)   Resp 18   Ht 5\' 10"  (1.778 m)   Wt 64.4 kg   SpO2 99%   BMI 20.37 kg/m   Physical Exam Vitals and nursing note reviewed.  80 year old male, resting comfortably and in no acute distress. Vital signs are significant for elevated blood pressure and slightly elevated heart rate. Oxygen saturation is 99%, which is normal. Head is normocephalic and atraumatic. PERRLA, EOMI. Oropharynx is clear. Neck is nontender and supple without adenopathy or JVD. Back is nontender and there is no CVA  tenderness. Lungs are clear without rales, wheezes, or rhonchi. Chest is nontender. Heart has regular rate and rhythm without murmur. Abdomen has distended bladder up to the level of the umbilicus.  Abdomen is soft and nontender. Extremities have no cyanosis or edema, full range of motion is present. Skin is warm and dry without rash. Neurologic: Mental status is normal, cranial nerves are intact, moves all extremities equally.  ED Results / Procedures / Treatments   Labs (all labs ordered are listed, but only abnormal results are displayed) Labs Reviewed  URINALYSIS, ROUTINE W REFLEX MICROSCOPIC - Abnormal; Notable for the following components:      Result Value   Color, Urine STRAW (*)    Specific Gravity, Urine 1.004 (*)    Glucose, UA 50 (*)    Hgb urine dipstick LARGE (*)    All other components within normal limits  URINE CULTURE   Procedures Procedures   Medications Ordered in ED Medications  lidocaine (XYLOCAINE) 2 % jelly 1 application (has no administration in time range)    ED Course  I have reviewed the triage vital signs and the nursing notes.  Pertinent lab results that were available during my care of the patient were reviewed by me and considered in my medical decision making (see chart for details).    MDM Rules/Calculators/A&P                         Acute urinary retention.  Bladder scan confirms greater than 700 mL of urine present in the bladder.  Old records are reviewed showing 2 prior ED visits for urinary retention.  Foley catheter is placed and he is referred back to his urologist for follow-up.  Urinalysis shows large amount of hemoglobin, likely secondary to trauma of inserting the Foley catheter.  Urine in the bag appears clear, he is felt to be safe for discharge and follow-up with urology.  Final Clinical Impression(s) / ED Diagnoses Final diagnoses:  Acute urinary retention    Rx / DC Orders ED Discharge Orders     None         Delora Fuel, MD 17/61/60 [phone]

## 2021-02-03 NOTE — ED Notes (Signed)
Bladder scan volume: 763 mL.

## 2021-02-03 NOTE — ED Triage Notes (Signed)
Patient presents with urinary retention, last able to urinate around 2000. Patient states he has not been having issues so he stopped taking his 2 medications. Patient complaining of bladder pain and spasming. Patient with hx of needing a foley for same issue.

## 2021-02-03 NOTE — ED Notes (Signed)
Patient educated on catheter care.

## 2021-02-04 LAB — URINE CULTURE: Culture: NO GROWTH

## 2021-02-05 DIAGNOSIS — N401 Enlarged prostate with lower urinary tract symptoms: Secondary | ICD-10-CM | POA: Diagnosis not present

## 2021-02-05 DIAGNOSIS — R338 Other retention of urine: Secondary | ICD-10-CM | POA: Diagnosis not present

## 2021-02-11 DIAGNOSIS — Z20822 Contact with and (suspected) exposure to covid-19: Secondary | ICD-10-CM | POA: Diagnosis not present

## 2021-02-12 DIAGNOSIS — R338 Other retention of urine: Secondary | ICD-10-CM | POA: Diagnosis not present

## 2021-02-12 DIAGNOSIS — N3000 Acute cystitis without hematuria: Secondary | ICD-10-CM | POA: Diagnosis not present

## 2021-02-22 DIAGNOSIS — R338 Other retention of urine: Secondary | ICD-10-CM | POA: Diagnosis not present

## 2021-02-27 DIAGNOSIS — R338 Other retention of urine: Secondary | ICD-10-CM | POA: Diagnosis not present

## 2021-02-27 DIAGNOSIS — N401 Enlarged prostate with lower urinary tract symptoms: Secondary | ICD-10-CM | POA: Diagnosis not present

## 2021-03-19 DIAGNOSIS — I700 Atherosclerosis of aorta: Secondary | ICD-10-CM | POA: Diagnosis not present

## 2021-03-19 DIAGNOSIS — Z7984 Long term (current) use of oral hypoglycemic drugs: Secondary | ICD-10-CM | POA: Diagnosis not present

## 2021-03-19 DIAGNOSIS — E785 Hyperlipidemia, unspecified: Secondary | ICD-10-CM | POA: Diagnosis not present

## 2021-03-19 DIAGNOSIS — N401 Enlarged prostate with lower urinary tract symptoms: Secondary | ICD-10-CM | POA: Diagnosis not present

## 2021-03-19 DIAGNOSIS — E1165 Type 2 diabetes mellitus with hyperglycemia: Secondary | ICD-10-CM | POA: Diagnosis not present

## 2021-03-19 DIAGNOSIS — R338 Other retention of urine: Secondary | ICD-10-CM | POA: Diagnosis not present

## 2021-03-19 DIAGNOSIS — E1142 Type 2 diabetes mellitus with diabetic polyneuropathy: Secondary | ICD-10-CM | POA: Diagnosis not present

## 2021-03-22 DIAGNOSIS — E063 Autoimmune thyroiditis: Secondary | ICD-10-CM | POA: Diagnosis not present

## 2021-03-22 DIAGNOSIS — Z7984 Long term (current) use of oral hypoglycemic drugs: Secondary | ICD-10-CM | POA: Diagnosis not present

## 2021-03-22 DIAGNOSIS — E1142 Type 2 diabetes mellitus with diabetic polyneuropathy: Secondary | ICD-10-CM | POA: Diagnosis not present

## 2021-05-09 DIAGNOSIS — R972 Elevated prostate specific antigen [PSA]: Secondary | ICD-10-CM | POA: Diagnosis not present

## 2021-05-09 DIAGNOSIS — R3912 Poor urinary stream: Secondary | ICD-10-CM | POA: Diagnosis not present

## 2021-05-09 DIAGNOSIS — N401 Enlarged prostate with lower urinary tract symptoms: Secondary | ICD-10-CM | POA: Diagnosis not present

## 2021-09-24 DIAGNOSIS — Z79899 Other long term (current) drug therapy: Secondary | ICD-10-CM | POA: Diagnosis not present

## 2021-09-24 DIAGNOSIS — Z7984 Long term (current) use of oral hypoglycemic drugs: Secondary | ICD-10-CM | POA: Diagnosis not present

## 2021-09-24 DIAGNOSIS — E1142 Type 2 diabetes mellitus with diabetic polyneuropathy: Secondary | ICD-10-CM | POA: Diagnosis not present

## 2021-09-24 DIAGNOSIS — E063 Autoimmune thyroiditis: Secondary | ICD-10-CM | POA: Diagnosis not present

## 2021-11-13 DIAGNOSIS — Z8546 Personal history of malignant neoplasm of prostate: Secondary | ICD-10-CM | POA: Diagnosis not present

## 2021-11-13 DIAGNOSIS — E039 Hypothyroidism, unspecified: Secondary | ICD-10-CM | POA: Diagnosis not present

## 2021-11-13 DIAGNOSIS — Z1331 Encounter for screening for depression: Secondary | ICD-10-CM | POA: Diagnosis not present

## 2021-11-13 DIAGNOSIS — I2510 Atherosclerotic heart disease of native coronary artery without angina pectoris: Secondary | ICD-10-CM | POA: Diagnosis not present

## 2021-11-13 DIAGNOSIS — E1142 Type 2 diabetes mellitus with diabetic polyneuropathy: Secondary | ICD-10-CM | POA: Diagnosis not present

## 2021-11-13 DIAGNOSIS — N401 Enlarged prostate with lower urinary tract symptoms: Secondary | ICD-10-CM | POA: Diagnosis not present

## 2021-11-13 DIAGNOSIS — E785 Hyperlipidemia, unspecified: Secondary | ICD-10-CM | POA: Diagnosis not present

## 2021-11-13 DIAGNOSIS — Z0000 Encounter for general adult medical examination without abnormal findings: Secondary | ICD-10-CM | POA: Diagnosis not present

## 2021-11-21 DIAGNOSIS — R3912 Poor urinary stream: Secondary | ICD-10-CM | POA: Diagnosis not present

## 2021-11-21 DIAGNOSIS — C61 Malignant neoplasm of prostate: Secondary | ICD-10-CM | POA: Diagnosis not present

## 2021-11-21 DIAGNOSIS — N401 Enlarged prostate with lower urinary tract symptoms: Secondary | ICD-10-CM | POA: Diagnosis not present

## 2021-12-20 DIAGNOSIS — S90852A Superficial foreign body, left foot, initial encounter: Secondary | ICD-10-CM | POA: Diagnosis not present

## 2022-01-30 DIAGNOSIS — E119 Type 2 diabetes mellitus without complications: Secondary | ICD-10-CM | POA: Diagnosis not present

## 2022-01-30 DIAGNOSIS — H35033 Hypertensive retinopathy, bilateral: Secondary | ICD-10-CM | POA: Diagnosis not present

## 2022-01-30 DIAGNOSIS — H2513 Age-related nuclear cataract, bilateral: Secondary | ICD-10-CM | POA: Diagnosis not present

## 2022-01-30 DIAGNOSIS — H40013 Open angle with borderline findings, low risk, bilateral: Secondary | ICD-10-CM | POA: Diagnosis not present

## 2022-01-30 DIAGNOSIS — H53143 Visual discomfort, bilateral: Secondary | ICD-10-CM | POA: Diagnosis not present

## 2022-01-30 DIAGNOSIS — H52223 Regular astigmatism, bilateral: Secondary | ICD-10-CM | POA: Diagnosis not present

## 2022-01-30 DIAGNOSIS — H5203 Hypermetropia, bilateral: Secondary | ICD-10-CM | POA: Diagnosis not present

## 2022-01-30 DIAGNOSIS — H524 Presbyopia: Secondary | ICD-10-CM | POA: Diagnosis not present

## 2022-03-26 DIAGNOSIS — E063 Autoimmune thyroiditis: Secondary | ICD-10-CM | POA: Diagnosis not present

## 2022-03-26 DIAGNOSIS — E1142 Type 2 diabetes mellitus with diabetic polyneuropathy: Secondary | ICD-10-CM | POA: Diagnosis not present

## 2022-05-30 DIAGNOSIS — R972 Elevated prostate specific antigen [PSA]: Secondary | ICD-10-CM | POA: Diagnosis not present

## 2022-05-30 DIAGNOSIS — C61 Malignant neoplasm of prostate: Secondary | ICD-10-CM | POA: Diagnosis not present

## 2022-05-30 DIAGNOSIS — N401 Enlarged prostate with lower urinary tract symptoms: Secondary | ICD-10-CM | POA: Diagnosis not present

## 2022-05-30 DIAGNOSIS — R3912 Poor urinary stream: Secondary | ICD-10-CM | POA: Diagnosis not present

## 2022-09-26 DIAGNOSIS — E1142 Type 2 diabetes mellitus with diabetic polyneuropathy: Secondary | ICD-10-CM | POA: Diagnosis not present

## 2022-09-26 DIAGNOSIS — E063 Autoimmune thyroiditis: Secondary | ICD-10-CM | POA: Diagnosis not present

## 2022-11-19 DIAGNOSIS — N401 Enlarged prostate with lower urinary tract symptoms: Secondary | ICD-10-CM | POA: Diagnosis not present

## 2022-11-19 DIAGNOSIS — E063 Autoimmune thyroiditis: Secondary | ICD-10-CM | POA: Diagnosis not present

## 2022-11-19 DIAGNOSIS — E1142 Type 2 diabetes mellitus with diabetic polyneuropathy: Secondary | ICD-10-CM | POA: Diagnosis not present

## 2022-11-19 DIAGNOSIS — I2510 Atherosclerotic heart disease of native coronary artery without angina pectoris: Secondary | ICD-10-CM | POA: Diagnosis not present

## 2022-11-19 DIAGNOSIS — Z0000 Encounter for general adult medical examination without abnormal findings: Secondary | ICD-10-CM | POA: Diagnosis not present

## 2022-11-19 DIAGNOSIS — I700 Atherosclerosis of aorta: Secondary | ICD-10-CM | POA: Diagnosis not present

## 2022-11-19 DIAGNOSIS — E785 Hyperlipidemia, unspecified: Secondary | ICD-10-CM | POA: Diagnosis not present

## 2022-11-19 DIAGNOSIS — N529 Male erectile dysfunction, unspecified: Secondary | ICD-10-CM | POA: Diagnosis not present

## 2022-11-19 DIAGNOSIS — Z1331 Encounter for screening for depression: Secondary | ICD-10-CM | POA: Diagnosis not present

## 2022-11-19 DIAGNOSIS — E039 Hypothyroidism, unspecified: Secondary | ICD-10-CM | POA: Diagnosis not present

## 2022-11-19 DIAGNOSIS — I10 Essential (primary) hypertension: Secondary | ICD-10-CM | POA: Diagnosis not present

## 2022-11-19 DIAGNOSIS — Z8546 Personal history of malignant neoplasm of prostate: Secondary | ICD-10-CM | POA: Diagnosis not present

## 2022-11-19 DIAGNOSIS — M791 Myalgia, unspecified site: Secondary | ICD-10-CM | POA: Diagnosis not present

## 2022-11-27 DIAGNOSIS — N401 Enlarged prostate with lower urinary tract symptoms: Secondary | ICD-10-CM | POA: Diagnosis not present

## 2022-11-27 DIAGNOSIS — R972 Elevated prostate specific antigen [PSA]: Secondary | ICD-10-CM | POA: Diagnosis not present

## 2022-11-27 DIAGNOSIS — R3912 Poor urinary stream: Secondary | ICD-10-CM | POA: Diagnosis not present

## 2023-01-13 DIAGNOSIS — R21 Rash and other nonspecific skin eruption: Secondary | ICD-10-CM | POA: Diagnosis not present

## 2023-01-13 DIAGNOSIS — R1110 Vomiting, unspecified: Secondary | ICD-10-CM | POA: Diagnosis not present

## 2023-01-13 DIAGNOSIS — E1142 Type 2 diabetes mellitus with diabetic polyneuropathy: Secondary | ICD-10-CM | POA: Diagnosis not present

## 2023-04-24 DIAGNOSIS — E1165 Type 2 diabetes mellitus with hyperglycemia: Secondary | ICD-10-CM | POA: Diagnosis not present

## 2023-05-14 DIAGNOSIS — R509 Fever, unspecified: Secondary | ICD-10-CM | POA: Diagnosis not present

## 2023-05-14 DIAGNOSIS — R109 Unspecified abdominal pain: Secondary | ICD-10-CM | POA: Diagnosis not present

## 2023-05-14 DIAGNOSIS — B349 Viral infection, unspecified: Secondary | ICD-10-CM | POA: Diagnosis not present

## 2023-05-14 DIAGNOSIS — A419 Sepsis, unspecified organism: Secondary | ICD-10-CM | POA: Diagnosis not present

## 2023-05-14 DIAGNOSIS — Z03818 Encounter for observation for suspected exposure to other biological agents ruled out: Secondary | ICD-10-CM | POA: Diagnosis not present

## 2023-05-14 DIAGNOSIS — R079 Chest pain, unspecified: Secondary | ICD-10-CM | POA: Diagnosis not present

## 2023-05-15 ENCOUNTER — Inpatient Hospital Stay (HOSPITAL_COMMUNITY)
Admission: EM | Admit: 2023-05-15 | Discharge: 2023-05-17 | DRG: 872 | Disposition: A | Discharge status: Home / Self Care | Attending: Internal Medicine | Admitting: Internal Medicine

## 2023-05-15 ENCOUNTER — Other Ambulatory Visit: Payer: Self-pay

## 2023-05-15 ENCOUNTER — Emergency Department (HOSPITAL_COMMUNITY)

## 2023-05-15 ENCOUNTER — Telehealth (HOSPITAL_BASED_OUTPATIENT_CLINIC_OR_DEPARTMENT_OTHER): Payer: Self-pay | Admitting: Cardiovascular Disease

## 2023-05-15 ENCOUNTER — Encounter (HOSPITAL_COMMUNITY): Payer: Self-pay

## 2023-05-15 DIAGNOSIS — Z888 Allergy status to other drugs, medicaments and biological substances status: Secondary | ICD-10-CM

## 2023-05-15 DIAGNOSIS — I2489 Other forms of acute ischemic heart disease: Secondary | ICD-10-CM | POA: Diagnosis not present

## 2023-05-15 DIAGNOSIS — D696 Thrombocytopenia, unspecified: Secondary | ICD-10-CM | POA: Diagnosis not present

## 2023-05-15 DIAGNOSIS — A419 Sepsis, unspecified organism: Secondary | ICD-10-CM | POA: Diagnosis not present

## 2023-05-15 DIAGNOSIS — E063 Autoimmune thyroiditis: Secondary | ICD-10-CM | POA: Diagnosis not present

## 2023-05-15 DIAGNOSIS — N12 Tubulo-interstitial nephritis, not specified as acute or chronic: Secondary | ICD-10-CM | POA: Diagnosis present

## 2023-05-15 DIAGNOSIS — A412 Sepsis due to unspecified staphylococcus: Secondary | ICD-10-CM

## 2023-05-15 DIAGNOSIS — N200 Calculus of kidney: Secondary | ICD-10-CM | POA: Diagnosis not present

## 2023-05-15 DIAGNOSIS — Z7984 Long term (current) use of oral hypoglycemic drugs: Secondary | ICD-10-CM | POA: Diagnosis not present

## 2023-05-15 DIAGNOSIS — N400 Enlarged prostate without lower urinary tract symptoms: Secondary | ICD-10-CM | POA: Diagnosis not present

## 2023-05-15 DIAGNOSIS — K8020 Calculus of gallbladder without cholecystitis without obstruction: Secondary | ICD-10-CM | POA: Diagnosis not present

## 2023-05-15 DIAGNOSIS — E860 Dehydration: Secondary | ICD-10-CM | POA: Diagnosis present

## 2023-05-15 DIAGNOSIS — R7989 Other specified abnormal findings of blood chemistry: Secondary | ICD-10-CM

## 2023-05-15 DIAGNOSIS — R6520 Severe sepsis without septic shock: Secondary | ICD-10-CM | POA: Diagnosis not present

## 2023-05-15 DIAGNOSIS — N179 Acute kidney failure, unspecified: Principal | ICD-10-CM

## 2023-05-15 DIAGNOSIS — E119 Type 2 diabetes mellitus without complications: Secondary | ICD-10-CM

## 2023-05-15 DIAGNOSIS — Z1152 Encounter for screening for COVID-19: Secondary | ICD-10-CM

## 2023-05-15 DIAGNOSIS — N10 Acute tubulo-interstitial nephritis: Secondary | ICD-10-CM | POA: Diagnosis present

## 2023-05-15 DIAGNOSIS — E039 Hypothyroidism, unspecified: Secondary | ICD-10-CM

## 2023-05-15 DIAGNOSIS — E7800 Pure hypercholesterolemia, unspecified: Secondary | ICD-10-CM | POA: Diagnosis not present

## 2023-05-15 DIAGNOSIS — Z91018 Allergy to other foods: Secondary | ICD-10-CM

## 2023-05-15 DIAGNOSIS — Q245 Malformation of coronary vessels: Secondary | ICD-10-CM | POA: Diagnosis not present

## 2023-05-15 DIAGNOSIS — E871 Hypo-osmolality and hyponatremia: Secondary | ICD-10-CM | POA: Diagnosis not present

## 2023-05-15 DIAGNOSIS — E872 Acidosis, unspecified: Secondary | ICD-10-CM | POA: Diagnosis not present

## 2023-05-15 DIAGNOSIS — R739 Hyperglycemia, unspecified: Secondary | ICD-10-CM | POA: Insufficient documentation

## 2023-05-15 DIAGNOSIS — Z79899 Other long term (current) drug therapy: Secondary | ICD-10-CM | POA: Diagnosis not present

## 2023-05-15 DIAGNOSIS — R000 Tachycardia, unspecified: Secondary | ICD-10-CM | POA: Diagnosis not present

## 2023-05-15 DIAGNOSIS — I700 Atherosclerosis of aorta: Secondary | ICD-10-CM | POA: Diagnosis not present

## 2023-05-15 LAB — URINALYSIS, W/ REFLEX TO CULTURE (INFECTION SUSPECTED)
Bilirubin Urine: NEGATIVE
Glucose, UA: >=500 mg/dL — AB
Ketones, ur: 20 mg/dL — AB
Nitrite: NEGATIVE
Protein, ur: NEGATIVE mg/dL
Specific Gravity, Urine: 1.023 (ref 1.005–1.030)
pH: 5 (ref 5.0–8.0)

## 2023-05-15 LAB — CBC WITH DIFFERENTIAL/PLATELET
Abs Immature Granulocytes: 0.05 10*3/uL (ref 0.00–0.07)
Basophils Absolute: 0 10*3/uL (ref 0.0–0.1)
Basophils Relative: 0 %
Eosinophils Absolute: 0 10*3/uL (ref 0.0–0.5)
Eosinophils Relative: 0 %
HCT: 47.8 % (ref 39.0–52.0)
Hemoglobin: 15.8 g/dL (ref 13.0–17.0)
Immature Granulocytes: 1 %
Lymphocytes Relative: 3 %
Lymphs Abs: 0.3 10*3/uL — ABNORMAL LOW (ref 0.7–4.0)
MCH: 29.2 pg (ref 26.0–34.0)
MCHC: 33.1 g/dL (ref 30.0–36.0)
MCV: 88.4 fL (ref 80.0–100.0)
Monocytes Absolute: 0.4 10*3/uL (ref 0.1–1.0)
Monocytes Relative: 4 %
Neutro Abs: 9.2 10*3/uL — ABNORMAL HIGH (ref 1.7–7.7)
Neutrophils Relative %: 92 %
Platelets: 120 10*3/uL — ABNORMAL LOW (ref 150–400)
RBC: 5.41 MIL/uL (ref 4.22–5.81)
RDW: 14.1 % (ref 11.5–15.5)
Smear Review: DECREASED
WBC: 9.9 10*3/uL (ref 4.0–10.5)
nRBC: 0 % (ref 0.0–0.2)

## 2023-05-15 LAB — COMPREHENSIVE METABOLIC PANEL
ALT: 22 U/L (ref 0–44)
AST: 38 U/L (ref 15–41)
Albumin: 3.1 g/dL — ABNORMAL LOW (ref 3.5–5.0)
Alkaline Phosphatase: 222 U/L — ABNORMAL HIGH (ref 38–126)
Anion gap: 15 (ref 5–15)
BUN: 29 mg/dL — ABNORMAL HIGH (ref 8–23)
CO2: 17 mmol/L — ABNORMAL LOW (ref 22–32)
Calcium: 8.6 mg/dL — ABNORMAL LOW (ref 8.9–10.3)
Chloride: 101 mmol/L (ref 98–111)
Creatinine, Ser: 1.69 mg/dL — ABNORMAL HIGH (ref 0.61–1.24)
GFR, Estimated: 41 mL/min — ABNORMAL LOW (ref >60)
Glucose, Bld: 251 mg/dL — ABNORMAL HIGH (ref 70–99)
Potassium: 3.7 mmol/L (ref 3.5–5.1)
Sodium: 133 mmol/L — ABNORMAL LOW (ref 135–145)
Total Bilirubin: 1.6 mg/dL — ABNORMAL HIGH (ref 0.0–1.2)
Total Protein: 6.8 g/dL (ref 6.5–8.1)

## 2023-05-15 LAB — TROPONIN I (HIGH SENSITIVITY)
Troponin I (High Sensitivity): 293 ng/L (ref <18)
Troponin I (High Sensitivity): 179 ng/L (ref <18)

## 2023-05-15 LAB — I-STAT CG4 LACTIC ACID, ED
Lactic Acid, Venous: 2 mmol/L (ref 0.5–1.9)
Lactic Acid, Venous: 1.3 mmol/L (ref 0.5–1.9)

## 2023-05-15 LAB — TSH: TSH: 12.847 u[IU]/mL — ABNORMAL HIGH (ref 0.350–4.500)

## 2023-05-15 LAB — CBG MONITORING, ED: Glucose-Capillary: 165 mg/dL — ABNORMAL HIGH (ref 70–99)

## 2023-05-15 MED ORDER — HEPARIN (PORCINE) 25000 UT/250ML-% IV SOLN
800.0000 [IU]/h | INTRAVENOUS | Status: DC
  Administered 2023-05-15: 800 [IU]/h via INTRAVENOUS
  Filled 2023-05-15: qty 250

## 2023-05-15 MED ORDER — INSULIN ASPART 100 UNIT/ML IJ SOLN
0.0000 [IU] | Freq: Three times a day (TID) | INTRAMUSCULAR | Status: DC
  Administered 2023-05-16: 3 [IU] via SUBCUTANEOUS
  Administered 2023-05-16: 8 [IU] via SUBCUTANEOUS
  Administered 2023-05-16: 3 [IU] via SUBCUTANEOUS
  Administered 2023-05-17: 2 [IU] via SUBCUTANEOUS
  Administered 2023-05-17: 3 [IU] via SUBCUTANEOUS

## 2023-05-15 MED ORDER — OXYCODONE HCL 5 MG PO TABS: 5.0000 mg | ORAL_TABLET | ORAL | Status: DC | PRN

## 2023-05-15 MED ORDER — HEPARIN BOLUS VIA INFUSION
4000.0000 [IU] | Freq: Once | INTRAVENOUS | Status: AC
  Administered 2023-05-15: 4000 [IU] via INTRAVENOUS
  Filled 2023-05-15: qty 4000

## 2023-05-15 MED ORDER — INSULIN ASPART 100 UNIT/ML IJ SOLN
0.0000 [IU] | Freq: Every day | INTRAMUSCULAR | Status: DC
  Administered 2023-05-16: 2 [IU] via SUBCUTANEOUS

## 2023-05-15 MED ORDER — IOHEXOL 350 MG/ML SOLN
75.0000 mL | Freq: Once | INTRAVENOUS | Status: AC | PRN
  Administered 2023-05-15: 75 mL via INTRAVENOUS

## 2023-05-15 MED ORDER — SODIUM CHLORIDE 0.9 % IV SOLN: INTRAVENOUS | Status: AC

## 2023-05-15 MED ORDER — SODIUM CHLORIDE 0.9 % IV SOLN: 1.0000 g | INTRAVENOUS | Status: DC

## 2023-05-15 MED ORDER — ACETAMINOPHEN 325 MG PO TABS
650.0000 mg | ORAL_TABLET | Freq: Four times a day (QID) | ORAL | Status: DC | PRN
  Administered 2023-05-16: 650 mg via ORAL
  Filled 2023-05-15: qty 2

## 2023-05-15 MED ORDER — SENNOSIDES-DOCUSATE SODIUM 8.6-50 MG PO TABS: 1.0000 | ORAL_TABLET | Freq: Every evening | ORAL | Status: DC | PRN

## 2023-05-15 MED ORDER — HEPARIN SODIUM (PORCINE) 5000 UNIT/ML IJ SOLN
5000.0000 [IU] | Freq: Three times a day (TID) | INTRAMUSCULAR | Status: DC
  Filled 2023-05-15 (×2): qty 1

## 2023-05-15 MED ORDER — ASPIRIN 325 MG PO TABS
325.0000 mg | ORAL_TABLET | Freq: Once | ORAL | Status: AC
  Administered 2023-05-15: 325 mg via ORAL
  Filled 2023-05-15: qty 1

## 2023-05-15 MED ORDER — SODIUM CHLORIDE 0.9 % IV SOLN
1.0000 g | Freq: Once | INTRAVENOUS | Status: AC
  Administered 2023-05-15: 1 g via INTRAVENOUS
  Filled 2023-05-15: qty 10

## 2023-05-15 MED ORDER — ONDANSETRON HCL 4 MG/2ML IJ SOLN: 4.0000 mg | Freq: Four times a day (QID) | INTRAMUSCULAR | Status: DC | PRN

## 2023-05-15 MED ORDER — ACETAMINOPHEN 650 MG RE SUPP: 650.0000 mg | Freq: Four times a day (QID) | RECTAL | Status: DC | PRN

## 2023-05-15 NOTE — Consult Note (Signed)
 CARDIOLOGY CONSULT NOTE       Patient ID: Charles Strong MRN: [id number] DOB/AGE: [birth year] 81 y.o.  Admit date: 05/15/2023 Referring Physician: Silverio Lay Primary Physician: Lorenda Ishihara, MD Primary Cardiologist: Duke Salvia Reason for Consultation: Elevated troponin  Active Problems:   * No active hospital problems. *   HPI:  83 y.o. seen in ED for elevated troponin History of Hashimoto thyroiditis, BPH, DM Seen by Dr Duke Salvia in 2021 Prior chest pain in 2016 with cardiac CTA showing calcium score 0 no obstructive dx and ? LAD myocardial bridge. Has been fine since then Active with no chest pain Tends to be bradycardic Rx HLD with diet only. Has been ill for about 2 weeks. Has had classic rigors, chills and fever as high as 102. Has had abdominal pain particularly in the LUQ WBC elevated as outpatient with primary 14 and troponin 96 Started on Cipro ? UTI and when labs came back told to go to ER.   Currently he is tachycardic but not toxic appearing. WBC is 9.9 with Hct 47.8. PLTls are done at 120 He denies liver dx or excess ETOH BUN up 29 with Cr 1.69 and total bili 1.6 normal AST/ALT. He is not jaundice.   He denies any chest pain troponin in our ER 293.  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 No pertinent family history.  Social History   Socioeconomic History   Marital status: Single    Spouse name: Not on file   Number of children: Not on file   Years of education: Not on file   Highest education level: Not on file  Occupational History   Not on file   Tobacco Use   Smoking status: Never   Smokeless tobacco: Never  Substance and Sexual Activity   Alcohol use: No   Drug use: No   Sexual activity: Not on file  Other Topics Concern   Not on file  Social History Narrative   Not on file   Social Drivers of Health   Financial Resource Strain: Not on file  Food Insecurity: Not on file  Transportation Needs: Not on file  Physical Activity: Not on file  Stress: Not on file  Social Connections: Not on file  Intimate Partner Violence: Not on file    Past Surgical History:  Procedure Laterality Date   TONSILLECTOMY        Current Facility-Administered Medications:    cefTRIAXone (ROCEPHIN) 1 g in sodium chloride 0.9 % 100 mL IVPB, 1 g, Intravenous, Once, Charlynne Pander, MD, Last Rate: 200 mL/hr at 05/15/23 1802, 1 g at 05/15/23 1802   heparin ADULT infusion 100 units/mL (25000 units/236mL), 800 Units/hr, Intravenous, Continuous, Wilmer Floor, RPH   heparin bolus via infusion 4,000 Units, 4,000 Units, Intravenous, Once, Wilmer Floor, [facility]  Current Outpatient Medications:    APPLE CIDER VINEGAR PO, Take 30 mLs by mouth daily. , Disp: , Rfl:    Barberry-Oreg Grape-Goldenseal (BERBERINE COMPLEX PO), Take 2 capsules by mouth 2 (two) times daily., Disp: , Rfl:  finasteride (PROSCAR) 5 MG tablet, Take 5 mg by mouth daily.  (Patient not taking: Reported on 02/03/2021), Disp: , Rfl: 11   fluticasone (FLONASE) 50 MCG/ACT nasal spray, PLACE 2 SPRAYS IN EACH NOSTRIL DAILY AS NEEDED FOR ALLERGIES, Disp: 48 g, Rfl: 0   glipiZIDE (GLUCOTROL XL) 5 MG 24 hr tablet, Take 5 mg by mouth daily., Disp: , Rfl:    glucose blood test strip, 1 each by Other route 3 (three) times daily. Use as instructed, Disp: , Rfl:    NITROSTAT 0.4 MG SL tablet, PLACE 1 TABLET UNDER THE TONGUE EVERY 5 MINUTES AS NEEDED FOR CHEST PAIN (Patient not taking: Reported on 02/03/2021), Disp: 25 tablet, Rfl: 5   Potassium 99 MG TABS, Take 99 mg by mouth daily., Disp: , Rfl:     Selenium 200 MCG TABS, Take 200 mcg by mouth daily., Disp: , Rfl:    silodosin (RAPAFLO) 8 MG CAPS capsule, Take 8 mg by mouth daily with breakfast.  (Patient not taking: Reported on 02/03/2021), Disp: , Rfl: 11   thiamine (VITAMIN B-1) 100 MG tablet, Take 100 mg by mouth daily., Disp: , Rfl:    TURMERIC PO, Take 400 mg by mouth daily., Disp: , Rfl:   heparin  4,000 Units Intravenous Once    cefTRIAXone (ROCEPHIN)  IV 1 g (05/15/23 1802)   heparin      Physical Exam: Blood pressure 123/67, pulse 100, temperature 97.8 F (36.6 C), temperature source Oral, resp. rate (!) 21, height 5\' 10"  (1.778 m), weight 65.8 kg, SpO2 99%.   Thin white male Lungs clear No murmur  LUQ pain to palpation no obvious splenomegaly No edema Palpable pedal pulses   Labs:   Lab Results  Component Value Date   WBC 9.9 05/15/2023   HGB 15.8 05/15/2023   HCT 47.8 05/15/2023   MCV 88.4 05/15/2023   PLT 120 (L) 05/15/2023    Recent Labs  Lab 05/15/23 1637  NA 133*  K 3.7  CL 101  CO2 17*  BUN 29*  CREATININE 1.69*  CALCIUM 8.6*  PROT 6.8  BILITOT 1.6*  ALKPHOS 222*  ALT 22  AST 38  GLUCOSE 251*   No results found for: "CKTOTAL", "CKMB", "CKMBINDEX", "TROPONINI"  Lab Results  Component Value Date   CHOL 163 12/19/2014   Lab Results  Component Value Date   HDL 63 12/19/2014   Lab Results  Component Value Date   LDLCALC 85 12/19/2014   Lab Results  Component Value Date   TRIG 73 12/19/2014   Lab Results  Component Value Date   CHOLHDL 2.6 12/19/2014   No results found for: "LDLDIRECT"    Radiology: DG Chest 2 View Result Date: 05/15/2023 CLINICAL DATA:  Sepsis EXAM: CHEST - 2 VIEW COMPARISON:  05/14/2023 x-ray.  CT 03/21/2016 FINDINGS: The heart size and mediastinal contours are within normal limits. Hyperinflation. No consolidation, pneumothorax or effusion. No edema. Normal cardiopericardial silhouette. Calcified aorta. Old left-sided rib fractures. On the lateral view  the posteroinferior costophrenic angles are clipped off the edge of the film. IMPRESSION: Hyperinflation with chronic changes. Electronically Signed   By: Karen Kays M.D.   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 Not having any chest pain. ECG non acute Troponin 293. No indication for urgent cath. Trend troponin, follow ECG and perform TTE in  am. Would not start heparin until at least CT scans done Sepsis:  Lactic acid elevated 2.0 Check pro calcitonin WBC elevated as outpatient He describes classic rigors, chills and temp 102. His outpatient UA was benign when his wife showed it to me. CXR NAD. Pending abdominal CT scan which may be very telling given LUQ pain. BC;s drawn being covered with Rocephin for now Thrombocytopenia:  with current LUQ pain abdominal CT to r/o splenomegaly or splenic infarct.  Hashimoto's Thyroiditis :  TSH pending  Azotemia:  likely from dehydration/sepsis wife says he's had very poor PO intake last 4 days. Hydrate with NS per primary team and follow BMET in am DM:  BS elevated normally on glipizide per primary service BPH:  continue home proscar no urinary retention repeat UA here  Signed: Charlton Haws 05/15/2023, 6:13 PM

## 2023-05-15 NOTE — ED Notes (Signed)
 Called and placed PT on monitor with CCMD.

## 2023-05-15 NOTE — ED Provider Triage Note (Signed)
 Emergency Medicine Provider Triage Evaluation Note  Charles Strong , a 83 y.o. male  was evaluated in triage.  Pt complains of abd pain. Report pain to L side of abdomen ongoing x 4 days.  Report having chills, fever, and weak/dizzy.  Was seen by PCP for his complaint sseveral days ago.  Was told he should come to the hospital due to elevated trop.  Denies runny nose, sneeze or cough, no n/v/d, or dysuria.    Review of Systems  Positive: As above Negative: As above  Physical Exam  BP (!) 152/118 (BP Location: Left Arm)   Pulse (!) 133   Temp 98.3 F (36.8 C)   Resp (!) 24   Ht 5\' 10"  (1.778 m)   Wt 65.8 kg   SpO2 96%   BMI 20.81 kg/m  Gen:   Awake, no distress   Resp:  Normal effort  MSK:   Moves extremities without difficulty  Other:  Tachycardia.    Medical Decision Making  Medically screening exam initiated at 4:30 PM.  Appropriate orders placed.  [name] was informed that the remainder of the evaluation will be completed by another provider, this initial triage assessment does not replace that evaluation, and the importance of remaining in the ED until their evaluation is complete.  Next room.     Fayrene Helper, PA-C 05/15/23 [phone]

## 2023-05-15 NOTE — ED Notes (Signed)
 PT declines covid swab due to having one done yesterday.

## 2023-05-15 NOTE — Telephone Encounter (Signed)
 Patient called in requesting appointment with cardiology for elevated Troponin level  Was added to tomorrow's schedule however reviewed PCP visit, below findings   Assessments Encounter Date Diagnosis (ICD Code) Assessment Notes Treatment Notes Treatment Clinical Notes Section Notes  05/14/2023 Viral syndrome (ICD-10 - B34.9)   Rule out COVID /flu/ RSV.  Take Tylenol as needed       05/14/2023 Sepsis (ICD-10 - A41.9) White count 14K, predominant neutrophils Left upper abdomen, left lower chest pain, could be pneumonia. Pyelonephritis cannot be ruled out. UA was abnormal, white count 14,000 elevated. Antibiotic ciprofloxacin was sent  Due to elevated troponin patient was advised to go to the hospital       05/14/2023 Fever and chills (ICD-10 - R50.9)   Rule out urinary infection      05/14/2023 Abdominal pain (ICD-10 - R10.9)   Left upper quadrant pain, not gastritis      05/14/2023 Chest pain at rest (ICD-10 - R07.9)   Troponin elevated significantly which indicates cardiac muscle injury. Informed patient to go to the hospital right away     Advised patient unfortunately secondary to these findings we could not see him in the office tomorrow he needed to go to the emergency room for evaluation. Stressed the importance of going to ED, reccommended [facility] ED however advised if it was between going and not going any ED would work   Patient verbalized understanding

## 2023-05-15 NOTE — Progress Notes (Signed)
 PHARMACY - ANTICOAGULATION CONSULT NOTE  Pharmacy Consult for UFH IV Infusion Indication: chest pain/ACS  Allergies  Allergen Reactions   Calcium Fortified Cookie [Nutritional Supplements] Diarrhea   Egg-Derived Products Diarrhea   Metformin And Related Diarrhea    Patient Measurements: Height: 5\' 10"  (177.8 cm) Weight: 65.8 kg (145 lb) IBW/kg (Calculated) : 73 Heparin Dosing Weight: 65.8 kg  Vital Signs: Temp: 97.8 F (36.6 C) (03/20 1803) Temp Source: Oral (03/20 1803) BP: 123/67 (03/20 1805) Pulse Rate: 100 (03/20 1805)  Labs: Recent Labs    05/15/23 1637  HGB 15.8  HCT 47.8  PLT 120*  CREATININE 1.69*  TROPONINIHS 293*    Estimated Creatinine Clearance: 32.4 mL/min (A) (by C-G formula based on SCr of 1.69 mg/dL (H)).   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 Patient was recommended by PCP to visit ED after troponin level was elevated (96) a few days ago. Today troponin increased to 293. Patient with complaints of pain in side of abdomen that has worsened. EKG with no ST changes but with LAFB. No blood thinners PTA. CBC stable. Start therapeutic AC with UFH.  Goal of Therapy:  Heparin level 0.3-0.7  units/ml Monitor platelets by anticoagulation protocol: Yes   Plan:  Give 4000 units bolus x 1 Start heparin infusion at 800 units/hr Check anti-Xa level in 8 hours and daily while on heparin Continue to monitor H&H and platelets  Wilmer Floor 05/15/2023,6:09 PM

## 2023-05-15 NOTE — H&P (Signed)
 PCP:   Lorenda Ishihara, MD   Chief Complaint:  Severe shaking chills, fever  HPI: This is a 83 year old male with past medical history significant for T2DM, BPH.  On Monday p.m. he developed pain on the left side of his stomach.  He went on to develop severe chills, fever Tmax 102.  He denies nausea, vomiting, hematuria, burning urination, confusion, no chest pains.  On Wednesday he saw his PCP.  Blood work and urinalysis were done.  He was diagnosed with a UTI and Cipro initiated.  He took the first dose of Cipro today.  Lab work showed a troponin of 96.  He was sent to the ER.  In the ER presenting vitals 152/118, 133, afebrile.  Sodium 133, CO2 17, glucose 251, creatinine 1.69 [baseline normal], alk phos 222, T. bili 1.6 [no prior LFTs for comparison], WBC 9.9, TSH 12.847.  Troponin 293=>179 Lactic acid 2=>1.3 EKG: Normal sinus rhythm UA small leukocytes and nitrates.  Few bacteria. CT abdomen pelvis, suggestive of left pyelonephritis Heparin drip initially initiated, subsequently discontinued Blood cultures, urine cultures collected.  IV Rocephin initiated.  Admission requested.  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 Authorizing Provider  APPLE CIDER VINEGAR PO Take 30 mLs by mouth daily.     [provider]  Barberry-Oreg Grape-Goldenseal (BERBERINE COMPLEX PO) Take 2 capsules by mouth 2 (two) times daily.    [provider]  finasteride (PROSCAR) 5 MG tablet Take 5 mg by mouth daily.  Patient not taking:  Reported on 02/03/2021 11/20/17   [provider]  fluticasone (FLONASE) 50 MCG/ACT nasal spray PLACE 2 SPRAYS IN EACH NOSTRIL DAILY AS NEEDED FOR ALLERGIES 11/07/17   Chilton Si, MD  glipiZIDE (GLUCOTROL XL) 5 MG 24 hr tablet Take 5 mg by mouth daily. 12/06/19   [provider]  glucose blood test strip 1 each by Other route 3 (three) times daily. Use as instructed    [provider]  NITROSTAT 0.4 MG SL tablet PLACE 1 TABLET UNDER THE TONGUE EVERY 5 MINUTES AS NEEDED FOR CHEST PAIN Patient not taking: Reported on 02/03/2021 01/22/16   Chilton Si, MD  Potassium 99 MG TABS Take 99 mg by mouth daily.    [provider]  Selenium 200 MCG TABS Take 200 mcg by mouth daily.    [provider]  silodosin (RAPAFLO) 8 MG CAPS capsule Take 8 mg by mouth daily with breakfast.  Patient not taking: Reported on 02/03/2021 11/20/17   [provider]  thiamine (VITAMIN B-1) 100 MG tablet Take 100 mg by mouth daily.    [provider]  TURMERIC PO Take 400 mg by mouth daily.    [provider]    Allergies:   Allergies  Allergen Reactions   Calcium Fortified Cookie [Nutritional Supplements] Diarrhea   Egg-Derived Products Diarrhea   Metformin And Related Diarrhea    Social History:  reports that he has never smoked. He has never used smokeless tobacco. He reports that he does  not drink alcohol and does not use drugs.  Family History: History reviewed. No pertinent family history.  Physical Exam: Vitals:   05/15/23 1614 05/15/23 1616 05/15/23 1803 05/15/23 1805  BP: (!) 152/118   123/67  Pulse: (!) 133   100  Resp: (!) 24   (!) 21  Temp: 98.3 F (36.8 C)  97.8 F (36.6 C)   TempSrc:   Oral   SpO2: 96%   99%  Weight:  65.8 kg    Height:  5\' 10"  (1.778 m)      General:  Alert and oriented x3, elderly gentleman, in no distress Eyes: Pink conjunctiva, no scleral icterus ENT: Moist oral mucosa Lungs: CTA B/L,  no use of assessor muscles Cardiovascular: Tachycardia, RRR, no murmurs Abdomen: soft, positive BS, non-tender, NTND, no flank tenderness GU: not examined Neuro: CN II - XII grossly intact Musculoskeletal: Moves all extremities equally, no edema Skin: no rash, no subcutaneous crepitation, no decubitus Psych: appropriate patient  Labs on Admission:  Recent Labs    05/15/23 1637  NA 133*  K 3.7  CL 101  CO2 17*  GLUCOSE 251*  BUN 29*  CREATININE 1.69*  CALCIUM 8.6*   Recent Labs    05/15/23 1637  AST 38  ALT 22  ALKPHOS 222*  BILITOT 1.6*  PROT 6.8  ALBUMIN 3.1*    Recent Labs    05/15/23 1637  WBC 9.9  NEUTROABS 9.2*  HGB 15.8  HCT 47.8  MCV 88.4  PLT 120*    Recent Labs    05/15/23 1637  TSH 12.847*    Micro Results: Recent Results (from the past 240 hours)  Urine Culture     Status: None (Preliminary result)   Collection Time: 05/15/23  4:30 PM   Specimen: Urine, Clean Catch  Result Value Ref Range Status   Specimen Description URINE, CLEAN CATCH  Final   Special Requests   Final    NONE Reflexed from [id number] Performed at [facility], [address], [place], [place] [address]    Culture PENDING  Incomplete   Report Status PENDING  Incomplete     Radiological Exams on Admission: CT Angio Chest PE W and/or Wo Contrast Result Date: 05/15/2023 CLINICAL DATA:  Pulmonary embolism (PE) suspected, high prob; Sepsis EXAM: CT ANGIOGRAPHY CHEST CT ABDOMEN AND PELVIS WITH CONTRAST TECHNIQUE: Multidetector CT imaging of the chest was performed using the standard protocol during bolus administration of intravenous contrast. Multiplanar CT image reconstructions and MIPs were obtained to evaluate the vascular anatomy. Multidetector CT imaging of the abdomen and pelvis was performed using the standard protocol during bolus administration of intravenous contrast. RADIATION DOSE REDUCTION: This exam was performed according to the departmental dose-optimization  program which includes automated exposure control, adjustment of the mA and/or kV according to patient size and/or use of iterative reconstruction technique. CONTRAST:  75mL OMNIPAQUE IOHEXOL 350 MG/ML SOLN COMPARISON:  Chest CT 03/21/2016.  Abdominal CT 10/11/2019. FINDINGS: CTA CHEST FINDINGS Cardiovascular: The pulmonary arteries are well opacified with contrast to the level of the segmental branches. There is no evidence of acute pulmonary embolism. No acute systemic arterial abnormalities. Suggested filling defect within the inferior left pulmonary veins (image 80/5) does not persist on the more delayed images obtained for the abdominal CT and is attributed to inflow artifact. The heart size is normal. There is no pericardial effusion. Mediastinum/Nodes: There are no enlarged mediastinal, hilar or axillary lymph nodes. The thyroid gland, trachea and esophagus demonstrate no  significant findings. Lungs/Pleura: No pleural effusion or pneumothorax. Mild dependent atelectasis at both lung bases. There are stable scattered small pulmonary nodules, including a perifissural nodule along the minor fissure and a subpleural nodule in the left lower lobe measuring 5 mm on image 92/6. These are consistent with benign findings based on stability; no follow-up imaging recommended. Musculoskeletal/Chest wall: No chest wall mass or suspicious osseous findings. Stable left-sided bone island. CT ABDOMEN AND PELVIS FINDINGS Hepatobiliary: The liver is normal in density without suspicious focal abnormality. Punctate calcified gallstone. No gallbladder wall thickening or biliary dilatation. Pancreas: Unremarkable. No pancreatic ductal dilatation or surrounding inflammatory changes. Spleen: Normal in size without focal abnormality. Adrenals/Urinary Tract: Both adrenal glands appear normal. There is a nonobstructing calculus in the lower pole infundibulum of the left kidney, measuring 10 mm on image 48/6, similar to previous CT.  Mildly heterogeneous enhancement in the lower pole of the left kidney, best seen on the delayed post-contrast images, suspicious for pyelonephritis. No focal perinephric fluid collection. No evidence of ureteral calculus or hydronephrosis. Trace air in the urinary bladder lumen, possibly iatrogenic. Mild bladder wall thickening. Stomach/Bowel: No enteric contrast administered. The stomach appears unremarkable for its degree of distension. No evidence of bowel wall thickening, distention or surrounding inflammatory change. Mildly prominent stool in the colon. Vascular/Lymphatic: There are no enlarged abdominal or pelvic lymph nodes. Mild aortic and branch vessel atherosclerosis without evidence of aneurysm or large vessel occlusion. Reproductive: Marked enlargement of the prostate gland protruding into the bladder base. Other: No evidence of abdominal wall mass or hernia. No ascites or pneumoperitoneum. Musculoskeletal: No acute or significant osseous findings. Lower lumbar spondylosis. Review of the MIP images confirms the above findings. IMPRESSION: 1. No evidence of acute pulmonary embolism or other acute vascular findings in the chest. 2. Mildly heterogeneous enhancement in the lower pole of the left kidney, suspicious for pyelonephritis. No evidence of focal perinephric fluid collection or hydronephrosis. 3. Nonobstructing calculus in the lower pole infundibulum of the left kidney. 4. Marked enlargement of the prostate gland protruding into the bladder base. Mild bladder wall thickening and trace air in the urinary bladder lumen, possibly iatrogenic. 5. Cholelithiasis without evidence of cholecystitis or biliary dilatation. 6.  Aortic Atherosclerosis (ICD10-I70.0). Electronically Signed   By: Carey Bullocks M.D.   On: 05/15/2023 19:42   CT ABDOMEN PELVIS W CONTRAST Result Date: 05/15/2023 CLINICAL DATA:  Pulmonary embolism (PE) suspected, high prob; Sepsis EXAM: CT ANGIOGRAPHY CHEST CT ABDOMEN AND PELVIS  WITH CONTRAST TECHNIQUE: Multidetector CT imaging of the chest was performed using the standard protocol during bolus administration of intravenous contrast. Multiplanar CT image reconstructions and MIPs were obtained to evaluate the vascular anatomy. Multidetector CT imaging of the abdomen and pelvis was performed using the standard protocol during bolus administration of intravenous contrast. RADIATION DOSE REDUCTION: This exam was performed according to the departmental dose-optimization program which includes automated exposure control, adjustment of the mA and/or kV according to patient size and/or use of iterative reconstruction technique. CONTRAST:  75mL OMNIPAQUE IOHEXOL 350 MG/ML SOLN COMPARISON:  Chest CT 03/21/2016.  Abdominal CT 10/11/2019. FINDINGS: CTA CHEST FINDINGS Cardiovascular: The pulmonary arteries are well opacified with contrast to the level of the segmental branches. There is no evidence of acute pulmonary embolism. No acute systemic arterial abnormalities. Suggested filling defect within the inferior left pulmonary veins (image 80/5) does not persist on the more delayed images obtained for the abdominal CT and is attributed to inflow artifact. The heart size is normal.  There is no pericardial effusion. Mediastinum/Nodes: There are no enlarged mediastinal, hilar or axillary lymph nodes. The thyroid gland, trachea and esophagus demonstrate no significant findings. Lungs/Pleura: No pleural effusion or pneumothorax. Mild dependent atelectasis at both lung bases. There are stable scattered small pulmonary nodules, including a perifissural nodule along the minor fissure and a subpleural nodule in the left lower lobe measuring 5 mm on image 92/6. These are consistent with benign findings based on stability; no follow-up imaging recommended. Musculoskeletal/Chest wall: No chest wall mass or suspicious osseous findings. Stable left-sided bone island. CT ABDOMEN AND PELVIS FINDINGS Hepatobiliary: The  liver is normal in density without suspicious focal abnormality. Punctate calcified gallstone. No gallbladder wall thickening or biliary dilatation. Pancreas: Unremarkable. No pancreatic ductal dilatation or surrounding inflammatory changes. Spleen: Normal in size without focal abnormality. Adrenals/Urinary Tract: Both adrenal glands appear normal. There is a nonobstructing calculus in the lower pole infundibulum of the left kidney, measuring 10 mm on image 48/6, similar to previous CT. Mildly heterogeneous enhancement in the lower pole of the left kidney, best seen on the delayed post-contrast images, suspicious for pyelonephritis. No focal perinephric fluid collection. No evidence of ureteral calculus or hydronephrosis. Trace air in the urinary bladder lumen, possibly iatrogenic. Mild bladder wall thickening. Stomach/Bowel: No enteric contrast administered. The stomach appears unremarkable for its degree of distension. No evidence of bowel wall thickening, distention or surrounding inflammatory change. Mildly prominent stool in the colon. Vascular/Lymphatic: There are no enlarged abdominal or pelvic lymph nodes. Mild aortic and branch vessel atherosclerosis without evidence of aneurysm or large vessel occlusion. Reproductive: Marked enlargement of the prostate gland protruding into the bladder base. Other: No evidence of abdominal wall mass or hernia. No ascites or pneumoperitoneum. Musculoskeletal: No acute or significant osseous findings. Lower lumbar spondylosis. Review of the MIP images confirms the above findings. IMPRESSION: 1. No evidence of acute pulmonary embolism or other acute vascular findings in the chest. 2. Mildly heterogeneous enhancement in the lower pole of the left kidney, suspicious for pyelonephritis. No evidence of focal perinephric fluid collection or hydronephrosis. 3. Nonobstructing calculus in the lower pole infundibulum of the left kidney. 4. Marked enlargement of the prostate gland  protruding into the bladder base. Mild bladder wall thickening and trace air in the urinary bladder lumen, possibly iatrogenic. 5. Cholelithiasis without evidence of cholecystitis or biliary dilatation. 6.  Aortic Atherosclerosis (ICD10-I70.0). Electronically Signed   By: Carey Bullocks M.D.   On: 05/15/2023 19:42   DG Chest 2 View Result Date: 05/15/2023 CLINICAL DATA:  Sepsis EXAM: CHEST - 2 VIEW COMPARISON:  05/14/2023 x-ray.  CT 03/21/2016 FINDINGS: The heart size and mediastinal contours are within normal limits. Hyperinflation. No consolidation, pneumothorax or effusion. No edema. Normal cardiopericardial silhouette. Calcified aorta. Old left-sided rib fractures. On the lateral view the posteroinferior costophrenic angles are clipped off the edge of the film. IMPRESSION: Hyperinflation with chronic changes. Electronically Signed   By: Karen Kays M.D.   On: 05/15/2023 17:21    Assessment/Plan Present on Admission:  Acute left-sided pyelonephritis //   Lactic Acidosis -Blood and urine cultures collected.  IV Rocephin started in ER, continued -IV fluid hydration.  Follow lactic acid curve until normalized   Elevated troponin -Thought to be demand ischemia associated with infection and AKI.  Heparin drip discontinued.  EDP discussed patient with cardiologist -2D echo in a.m. -Cardiology consult placed by EDP   AKI -IV fluid hydration.  BMP in a.m.   Hyponatremia -Repleting IV.   T2DM -  Sliding scale insulin initiated   Elevated LFt's -Hyperbilirubinemia.  Elevated alk phos.  No other labs for comparison -CMP in a.m. for nausea swallowing   Hypothyroidism -T4 ordered.   BPH -Flomax resumed    Metta Koranda 05/15/2023, 8:55 PM

## 2023-05-15 NOTE — ED Triage Notes (Signed)
 Pt came in via POV d/t his PCP telling him his Troponin was abnormal when labs were drawn 3 days ago. Pt reports he had bad pain in the lt side of his abd, has had 5 bad episodes of chills lasting about 30-40 minutes each, with weak, dizziness & feeling dehydrated. A/Ox4, denies pain in triage.

## 2023-05-15 NOTE — ED Notes (Signed)
 PT is shaking and c/o being cold. Temp is 97.9. Physician Gery Pray has been  notified.

## 2023-05-15 NOTE — ED Notes (Signed)
 PT ambulated to the bathroom with minimal assistance

## 2023-05-15 NOTE — ED Provider Notes (Signed)
  EMERGENCY DEPARTMENT AT [facility] Provider Note   CSN: [id number] Arrival date & time: 05/15/23  1555     History  Chief Complaint  Patient presents with   Abnormal labs   Abdominal Pain   Dizziness    Charles Strong is a 83 y.o. male history of diabetes, high cholesterol here presenting with left flank pain and fever and shortness of breath and elevated troponin.  Patient states that several days ago, he had some left flank pain.  He states that since then he has been having subjective chills.  He states that he had ran a fever 102 yesterday.  He went to see his doctor yesterday and was thought to have viral syndrome.  However he had lab work done yesterday that showed a white blood cell count of 14.  Patient also had a troponin done outpatient and the troponin was 96.  Patient was thought to have a UTI and was started on Cipro.  Patient states that he has a nonproductive cough.  He states that the abdominal pain has went away.  The history is provided by the patient.       Home Medications Prior to Admission medications   Medication Sig Start Date End Date Taking? Authorizing Provider  APPLE CIDER VINEGAR PO Take 30 mLs by mouth daily.     [provider]  Barberry-Oreg Grape-Goldenseal (BERBERINE COMPLEX PO) Take 2 capsules by mouth 2 (two) times daily.    [provider]  finasteride (PROSCAR) 5 MG tablet Take 5 mg by mouth daily.  Patient not taking: Reported on 02/03/2021 11/20/17   [provider]  fluticasone (FLONASE) 50 MCG/ACT nasal spray PLACE 2 SPRAYS IN EACH NOSTRIL DAILY AS NEEDED FOR ALLERGIES 11/07/17   Chilton Si, MD  glipiZIDE (GLUCOTROL XL) 5 MG 24 hr tablet Take 5 mg by mouth daily. 12/06/19   [provider]  glucose blood test strip 1 each by Other route 3 (three) times daily. Use as instructed    [provider]  NITROSTAT 0.4 MG SL tablet PLACE 1 TABLET UNDER THE TONGUE EVERY 5  MINUTES AS NEEDED FOR CHEST PAIN Patient not taking: Reported on 02/03/2021 01/22/16   Chilton Si, MD  Potassium 99 MG TABS Take 99 mg by mouth daily.    [provider]  Selenium 200 MCG TABS Take 200 mcg by mouth daily.    [provider]  silodosin (RAPAFLO) 8 MG CAPS capsule Take 8 mg by mouth daily with breakfast.  Patient not taking: Reported on 02/03/2021 11/20/17   [provider]  thiamine (VITAMIN B-1) 100 MG tablet Take 100 mg by mouth daily.    [provider]  TURMERIC PO Take 400 mg by mouth daily.    [provider]      Allergies    Calcium fortified cookie [nutritional supplements], Egg-derived products, and Metformin and related    Review of Systems   Review of Systems  Constitutional:  Positive for chills and fever.  Gastrointestinal:  Positive for abdominal pain.  Neurological:  Positive for dizziness.  All other systems reviewed and are negative.   Physical Exam Updated Vital Signs BP (!) 152/118 (BP Location: Left Arm)   Pulse (!) 133   Temp 98.3 F (36.8 C)   Resp (!) 24   Ht 5\' 10"  (1.778 m)   Wt 65.8 kg   SpO2 96%   BMI 20.81 kg/m  Physical Exam Vitals and nursing note reviewed.  Constitutional:      Comments: Chronically ill-appearing and slightly dehydrated  HENT:     Head: Normocephalic.     Mouth/Throat:     Pharynx: Oropharynx is clear.  Eyes:     Extraocular Movements: Extraocular movements intact.     Pupils: Pupils are equal, round, and reactive to light.  Cardiovascular:     Rate and Rhythm: Normal rate and regular rhythm.     Heart sounds: Normal heart sounds.  Pulmonary:     Effort: Pulmonary effort is normal.     Breath sounds: Normal breath sounds.  Abdominal:     General: Abdomen is flat.     Comments: Minimal left upper quadrant tenderness.  No rebound or guarding.  Minimal left CVA tenderness  Skin:    General: Skin is warm.     Capillary Refill: Capillary refill takes  less than 2 seconds.  Neurological:     General: No focal deficit present.     Mental Status: He is oriented to person, place, and time.  Psychiatric:        Mood and Affect: Mood normal.        Behavior: Behavior normal.     ED Results / Procedures / Treatments   Labs (all labs ordered are listed, but only abnormal results are displayed) Labs Reviewed  I-STAT CG4 LACTIC ACID, ED - Abnormal; Notable for the following components:      Result Value   Lactic Acid, Venous 2.0 (*)    All other components within normal limits  CULTURE, BLOOD (ROUTINE X 2)  CULTURE, BLOOD (ROUTINE X 2)  RESP PANEL BY RT-PCR (RSV, FLU A&B, COVID)  [id number]  COMPREHENSIVE METABOLIC PANEL  CBC WITH DIFFERENTIAL/PLATELET  URINALYSIS, W/ REFLEX TO CULTURE (INFECTION SUSPECTED)  TSH  TROPONIN I (HIGH SENSITIVITY)    EKG EKG Interpretation Date/Time:  Thursday May 15 2023 16:28:41 EDT Ventricular Rate:  129 PR Interval:  154 QRS Duration:  94 QT Interval:  312 QTC Calculation: 457 R Axis:   -88  Text Interpretation: Sinus tachycardia Left anterior fascicular block Possible Anterior infarct , age undetermined Abnormal ECG No previous ECGs available Confirmed by Richardean Canal [phone]) on 05/15/2023 5:01:10 PM  Radiology No results found.  Procedures Procedures    CRITICAL CARE Performed by: Richardean Canal   Total critical care time: 39 minutes  Critical care time was exclusive of separately billable procedures and treating other patients.  Critical care was necessary to treat or prevent imminent or life-threatening deterioration.  Critical care was time spent personally by me on the following activities: development of treatment plan with patient and/or surrogate as well as nursing, discussions with consultants, evaluation of patient's response to treatment, examination of patient, obtaining history from patient or surrogate, ordering and performing treatments and interventions, ordering and review  of laboratory studies, ordering and review of radiographic studies, pulse oximetry and re-evaluation of patient's condition.   Medications Ordered in ED Medications  cefTRIAXone (ROCEPHIN) 1 g in sodium chloride 0.9 % 100 mL IVPB (has no administration in time range)    ED Course/ Medical Decision Making/ A&P                                 Medical Decision Making RUDRANSH BELLANCA is a 83 y.o. male here presenting with left flank pain and fever and elevated troponin.  Clinically I think he likely has pyelonephritis versus infected  kidney stone.  The elevated troponin can be demand ischemia versus PE.  Plan to get CBC and CMP and lactate and cultures and urinalysis.  Will also repeat a troponin and also get CTA chest and CT abdomen pelvis.  Will treat empirically with Rocephin for pyelonephritis.  5:30 pm Troponin resulted at 293.  Troponin was 100 yesterday.  I discussed case with Dr. Tenny Craw from cardiology.  I still think it is likely demand ischemia versus PE.  She recommend heparin.  She also recommend echo and cardiology will see patient.  8:51 PM Dr. Eden Emms saw the patient and recommend holding off of heparin.  His troponin has trended down to 179 now.  UA showed questionable UTI and CTA did not show any PE.  Patient does have pyelonephritis on the left side.  I also discussed with the on-call cardiologist, Dr. Lendell Caprice.  He wants to defer to Dr. Fabio Bering assessment about heparin. Will stop heparin. Will admit for pyelonephritis, elevated trop likely from demand ischemia   Problems Addressed: AKI (acute kidney injury) [facility]): acute illness or injury Elevated troponin: acute illness or injury Pyelonephritis: acute illness or injury  Amount and/or Complexity of Data Reviewed Labs: ordered. Decision-making details documented in ED Course. Radiology: ordered and independent interpretation performed. Decision-making details documented in ED Course. ECG/medicine tests: ordered and  independent interpretation performed. Decision-making details documented in ED Course.  Risk OTC drugs. Prescription drug management. Decision regarding hospitalization.    Final Clinical Impression(s) / ED Diagnoses Final diagnoses:  None    Rx / DC Orders ED Discharge Orders     None         Charlynne Pander, MD 05/15/23 2053

## 2023-05-15 NOTE — ED Notes (Signed)
 PT no longer trembling or shivering since intravenous continuous fluids given. PT is now comfortable and resting in bed.

## 2023-05-15 NOTE — Telephone Encounter (Signed)
 New Message:    Patient says he needs to be seen today. He says his Troponin is high, it is 9

## 2023-05-16 ENCOUNTER — Inpatient Hospital Stay (HOSPITAL_COMMUNITY)

## 2023-05-16 ENCOUNTER — Ambulatory Visit: Admitting: Cardiology

## 2023-05-16 DIAGNOSIS — R7989 Other specified abnormal findings of blood chemistry: Secondary | ICD-10-CM

## 2023-05-16 DIAGNOSIS — I2489 Other forms of acute ischemic heart disease: Secondary | ICD-10-CM | POA: Diagnosis not present

## 2023-05-16 DIAGNOSIS — N12 Tubulo-interstitial nephritis, not specified as acute or chronic: Secondary | ICD-10-CM | POA: Diagnosis not present

## 2023-05-16 LAB — ECHOCARDIOGRAM COMPLETE
AR max vel: 2.18 cm2
AV Peak grad: 6.2 mmHg
Ao pk vel: 1.24 m/s
Area-P 1/2: 3.99 cm2
Height: 70 in
S' Lateral: 2.9 cm
Weight: 2291.2 [oz_av]

## 2023-05-16 LAB — COMPREHENSIVE METABOLIC PANEL
ALT: 19 U/L (ref 0–44)
AST: 27 U/L (ref 15–41)
Albumin: 2.7 g/dL — ABNORMAL LOW (ref 3.5–5.0)
Alkaline Phosphatase: 171 U/L — ABNORMAL HIGH (ref 38–126)
Anion gap: 15 (ref 5–15)
BUN: 24 mg/dL — ABNORMAL HIGH (ref 8–23)
CO2: 16 mmol/L — ABNORMAL LOW (ref 22–32)
Calcium: 8.2 mg/dL — ABNORMAL LOW (ref 8.9–10.3)
Chloride: 103 mmol/L (ref 98–111)
Creatinine, Ser: 1.55 mg/dL — ABNORMAL HIGH (ref 0.61–1.24)
GFR, Estimated: 45 mL/min — ABNORMAL LOW (ref >60)
Glucose, Bld: 138 mg/dL — ABNORMAL HIGH (ref 70–99)
Potassium: 3.9 mmol/L (ref 3.5–5.1)
Sodium: 134 mmol/L — ABNORMAL LOW (ref 135–145)
Total Bilirubin: 1.5 mg/dL — ABNORMAL HIGH (ref 0.0–1.2)
Total Protein: 6.4 g/dL — ABNORMAL LOW (ref 6.5–8.1)

## 2023-05-16 LAB — T4, FREE: Free T4: 0.86 ng/dL (ref 0.61–1.12)

## 2023-05-16 LAB — CBG MONITORING, ED
Glucose-Capillary: 179 mg/dL — ABNORMAL HIGH (ref 70–99)
Glucose-Capillary: 253 mg/dL — ABNORMAL HIGH (ref 70–99)

## 2023-05-16 LAB — MRSA NEXT GEN BY PCR, NASAL: MRSA by PCR Next Gen: NOT DETECTED

## 2023-05-16 LAB — URINE CULTURE

## 2023-05-16 LAB — GLUCOSE, CAPILLARY
Glucose-Capillary: 178 mg/dL — ABNORMAL HIGH (ref 70–99)
Glucose-Capillary: 222 mg/dL — ABNORMAL HIGH (ref 70–99)

## 2023-05-16 LAB — MAGNESIUM: Magnesium: 2.3 mg/dL (ref 1.7–2.4)

## 2023-05-16 LAB — HEMOGLOBIN A1C
Hgb A1c MFr Bld: 7.3 % — ABNORMAL HIGH (ref 4.8–5.6)
Mean Plasma Glucose: 162.81 mg/dL

## 2023-05-16 MED ORDER — SODIUM CHLORIDE 0.9 % IV SOLN
2.0000 g | Freq: Every day | INTRAVENOUS | Status: DC
  Administered 2023-05-16 – 2023-05-17 (×2): 2 g via INTRAVENOUS
  Filled 2023-05-16 (×2): qty 20

## 2023-05-16 MED ORDER — PIPERACILLIN-TAZOBACTAM 3.375 G IVPB
3.3750 g | Freq: Three times a day (TID) | INTRAVENOUS | Status: DC
  Administered 2023-05-16: 3.375 g via INTRAVENOUS
  Filled 2023-05-16: qty 50

## 2023-05-16 MED ORDER — IBUPROFEN 400 MG PO TABS
600.0000 mg | ORAL_TABLET | Freq: Once | ORAL | Status: AC
  Administered 2023-05-16: 600 mg via ORAL
  Filled 2023-05-16: qty 1

## 2023-05-16 MED ORDER — SODIUM CHLORIDE 0.9 % IV BOLUS
500.0000 mL | Freq: Once | INTRAVENOUS | Status: AC
  Administered 2023-05-16: 500 mL via INTRAVENOUS

## 2023-05-16 MED ORDER — VANCOMYCIN HCL 750 MG/150ML IV SOLN: 750.0000 mg | INTRAVENOUS | Status: DC

## 2023-05-16 MED ORDER — VANCOMYCIN HCL IN DEXTROSE 1-5 GM/200ML-% IV SOLN
1000.0000 mg | Freq: Once | INTRAVENOUS | Status: AC
  Administered 2023-05-16: 1000 mg via INTRAVENOUS
  Filled 2023-05-16: qty 200

## 2023-05-16 MED ORDER — FINASTERIDE 5 MG PO TABS
5.0000 mg | ORAL_TABLET | Freq: Every day | ORAL | Status: DC
  Administered 2023-05-16 – 2023-05-17 (×2): 5 mg via ORAL
  Filled 2023-05-16 (×3): qty 1

## 2023-05-16 NOTE — Inpatient Diabetes Management (Addendum)
 Inpatient Diabetes Program Recommendations  AACE/ADA: New Consensus Statement on Inpatient Glycemic Control (2015)  Target Ranges:  Prepandial:   less than 140 mg/dL      Peak postprandial:   less than 180 mg/dL (1-2 hours)      Critically ill patients:  140 - 180 mg/dL   Lab Results  Component Value Date   GLUCAP 253 (H) 05/16/2023   HGBA1C 7.3 (H) 05/16/2023    Review of Glycemic Control  Latest Reference Range & Units 05/15/23 22:38 05/16/23 07:39 05/16/23 11:13  Glucose-Capillary 70 - 99 mg/dL 564 (H) 332 (H) 951 (H)   Diabetes history: DM 2 Outpatient Diabetes medications: Farxiga 5 mg Daily, Glipizide 5 mg Daily, Metformin 500 mg Daily Current orders for Inpatient glycemic control:  Novolog 0-15 units tid + hs  A1c 7.3% on 3/21  Inpatient Diabetes Program Recommendations:    -   may consider adding Novolog 4 units tid meal coverage tid If eating >50% of meals.  -   Stop home Farxiga due to genitourinary infection risk. Pt follow up with provider.   Thanks,  Christena Deem RN, MSN, BC-ADM Inpatient Diabetes Coordinator Team Pager [phone] (8a-5p)

## 2023-05-16 NOTE — ED Notes (Signed)
Morning labs collected  

## 2023-05-16 NOTE — Significant Event (Signed)
 Patient refused subcutaneous heparin. Patient educated.

## 2023-05-16 NOTE — Progress Notes (Signed)
   Patient Name: Charles Strong Date of Encounter: 05/16/2023 [facility] Cardiologist: Chilton Si, MD   Interval Summary  .    Resting comfortably has not had any chest pain or shortness of breath.  Daughter is in the room.  Vital Signs .    Vitals:   05/16/23 0500 05/16/23 0515 05/16/23 0530 05/16/23 0608  BP: 108/64 102/65 99/68   Pulse: 81 76 83   Resp: (!) 22 20 (!) 21   Temp:    97.7 F (36.5 C)  TempSrc:    Oral  SpO2: 97% 97% 96%   Weight:      Height:        Intake/Output Summary (Last 24 hours) at 05/16/2023 0834 Last data filed at 05/16/2023 0756 Gross per 24 hour  Intake 798.8 ml  Output --  Net 798.8 ml      05/15/2023    4:16 PM 02/03/2021    1:18 AM 01/18/2020    7:53 AM  Last 3 Weights  Weight (lbs) 145 lb 142 lb 142 lb 3.2 oz  Weight (kg) 65.772 kg 64.411 kg 64.501 kg      Telemetry/ECG    Sinus rhythm heart rates in the 70s 80s- Personally Reviewed  CV Studies     Physical Exam .   GEN: No acute distress.   Neck: No JVD Cardiac: RRR, no murmurs, rubs, or gallops.  Respiratory: Clear to auscultation bilaterally. GI: Soft, nontender, non-distended  MS: No edema  Patient Profile    Charles Strong is a 83 y.o. male has hx of myocardial bridge, type 2 diabetes, hyperlipidemia, Hashimoto's thyroiditis.   Currently admitted for concerns of pyelonephritis with lactic acidosis, cardiology seen due to elevated troponins.  Assessment & Plan .     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  EKG with no acute ST-T wave changes.  Troponin 293-179, pattern not consistent with ACS and likely representing demand ischemia due to underlying pyelonephritis. Echocardiogram pending Can offer him outpatient ischemic evaluation although in the  absence of any symptoms do not think this is necessarily indicated.  Acute pyelonephritis  Lactic acidosis is improving and on antibiotics per primary team.  For questions or updates, please contact [facility] Please consult [email or web address] for contact info under        Signed, Abagail Kitchens, PA-C

## 2023-05-16 NOTE — Progress Notes (Signed)
 Echocardiogram 2D Echocardiogram has been performed.  Charles Strong 05/16/2023, 5:52 PM

## 2023-05-16 NOTE — Progress Notes (Signed)
 PROGRESS NOTE  Charles Strong  WGN:[id number] DOB: 1940-03-12 DOA: 05/15/2023 PCP: Lorenda Ishihara, MD   Brief Narrative: Patient is a 83 year old male with history of diabetes type 2, BPH who presented with chills, fever, abdominal pain.  He was diagnosed with UTI as an outpatient and was started on Cipro by his PCP.  On presentation, he was hypertensive, tachycardic.  Lab work showed creatinine of 1.6(baseline creatinine normal), this is of 12.8, lactic acid of 2.  UA was suspicious for UTI.  CT abdomen/pelvis showed left-sided pyonephritis.  Blood culture sent.  Started on ceftriaxone  Assessment & Plan:  Principal Problem:   Pyelonephritis Active Problems:   AKI (acute kidney injury) (HCC)   Lactic acid acidosis   Elevated troponin   Elevated LFTs   BPH (benign prostatic hyperplasia)   Hypothyroidism   T2DM (type 2 diabetes mellitus) (HCC)   Acute left-sided pyonephritis: Presented with fever, chills, tachycardia.  Recently started on Cipro by his PCP for UTI.  UA suspicious for UTI.  Urine culture, blood culture sent.  Started on ceftriaxone.  Continue gentle IV fluids.  Lactic acidosis resolved  AKI: Continue IV fluid.  Monitor BMP  Elevated troponin: Likely from demand ischemia likely from sepsis from pyelonephritis.  Follow-up 2D echo.  No chest pain.  Troponin trended down.  Cardiology does not recommend further workup except for echocardiogram.  Diabetes type 2: A1c of 7.3.  Continue sliding scale.  Continue home medications on discharge.  Takes metformin, glipizide at home  Elevated TSH: Free T4 normal.  Will recommend to follow-up with PCP in 1 to 2 weeks, repeat thyroid function test in 4 to 6 weeks.  Thyroid function test could be inaccurate in the setting of acute illness.  He does not have any symptoms of hypothyroidism  BPH: On Flomax        DVT prophylaxis:heparin injection 5,000 Units Start: 05/15/23 2200     Code Status: Full Code  Family  Communication: Wife at bedside on 3/21  Patient status:Inpatient  Patient is from :Home  Anticipated discharge HQ:[facility]  Estimated DC date:1-2 days   Consultants: None  Procedures:None  Antimicrobials:  Anti-infectives (From admission, onward)    Start     Dose/Rate Route Frequency Ordered Stop   05/17/23 1000  vancomycin (VANCOREADY) IVPB 750 mg/150 mL        750 mg 150 mL/hr over 60 Minutes Intravenous Every 24 hours 05/16/23 0529     05/16/23 1700  cefTRIAXone (ROCEPHIN) 1 g in sodium chloride 0.9 % 100 mL IVPB  Status:  Discontinued        1 g 200 mL/hr over 30 Minutes Intravenous Every 24 hours 05/15/23 2051 05/16/23 0505   05/16/23 0600  piperacillin-tazobactam (ZOSYN) IVPB 3.375 g        3.375 g 12.5 mL/hr over 240 Minutes Intravenous Every 8 hours 05/16/23 0529     05/16/23 0530  vancomycin (VANCOCIN) IVPB 1000 mg/200 mL premix        1,000 mg 200 mL/hr over 60 Minutes Intravenous  Once 05/16/23 0529 05/16/23 0718   05/15/23 1730  cefTRIAXone (ROCEPHIN) 1 g in sodium chloride 0.9 % 100 mL IVPB        1 g 200 mL/hr over 30 Minutes Intravenous  Once 05/15/23 1719 05/15/23 1833       Subjective: Patient seen and examined at bedside today.  Afebrile this morning.  He was febrile with 102.9 F at 2 am .  Complains of intermittent chills but  appeared comfortable during my evaluation.  No abdominal pain during evaluation.  No nausea or vomiting  Objective: Vitals:   05/16/23 0500 05/16/23 0515 05/16/23 0530 05/16/23 0608  BP: 108/64 102/65 99/68   Pulse: 81 76 83   Resp: (!) 22 20 (!) 21   Temp:    97.7 F (36.5 C)  TempSrc:    Oral  SpO2: 97% 97% 96%   Weight:      Height:        Intake/Output Summary (Last 24 hours) at 05/16/2023 0758 Last data filed at 05/16/2023 0756 Gross per 24 hour  Intake 798.8 ml  Output --  Net 798.8 ml   Filed Weights   05/15/23 1616  Weight: 65.8 kg    Examination:  General exam: Overall comfortable, not in  distress HEENT: PERRL Respiratory system:  no wheezes or crackles  Cardiovascular system: S1 & S2 heard, RRR.  Gastrointestinal system: Abdomen is nondistended, soft , mild tenderness in the left flank. Central nervous system: Alert and oriented Extremities: No edema, no clubbing ,no cyanosis Skin: No rashes, no ulcers,no icterus     Data Reviewed: I have personally reviewed following labs and imaging studies  CBC: Recent Labs  Lab 05/15/23 1637  WBC 9.9  NEUTROABS 9.2*  HGB 15.8  HCT 47.8  MCV 88.4  PLT 120*   Basic Metabolic Panel: Recent Labs  Lab 05/15/23 1637 05/16/23 0200 05/16/23 0613  NA 133* 134*  --   K 3.7 3.9  --   CL 101 103  --   CO2 17* 16*  --   GLUCOSE 251* 138*  --   BUN 29* 24*  --   CREATININE 1.69* 1.55*  --   CALCIUM 8.6* 8.2*  --   MG  --   --  2.3     Recent Results (from the past 240 hours)  Urine Culture     Status: None (Preliminary result)   Collection Time: 05/15/23  4:30 PM   Specimen: Urine, Clean Catch  Result Value Ref Range Status   Specimen Description URINE, CLEAN CATCH  Final   Special Requests   Final    NONE Reflexed from [id number] Performed at [facility], [address] [place], [place] [address]    Culture PENDING  Incomplete   Report Status PENDING  Incomplete  Blood culture (routine x 2)     Status: None (Preliminary result)   Collection Time: 05/15/23  5:15 PM   Specimen: BLOOD  Result Value Ref Range Status   Specimen Description BLOOD LEFT ANTECUBITAL  Final   Special Requests   Final    BOTTLES DRAWN AEROBIC AND ANAEROBIC Blood Culture results may not be optimal due to an inadequate volume of blood received in culture bottles   Culture   Final    NO GROWTH < 12 HOURS Performed at [facility], [address] [place], [place] [address]    Report Status PENDING  Incomplete  Blood culture (routine x 2)     Status: None (Preliminary result)   Collection Time: 05/15/23  5:40 PM   Specimen: BLOOD   Result Value Ref Range Status   Specimen Description BLOOD RIGHT ANTECUBITAL  Final   Special Requests   Final    BOTTLES DRAWN AEROBIC ONLY Blood Culture results may not be optimal due to an inadequate volume of blood received in culture bottles   Culture   Final    NO GROWTH < 12 HOURS Performed  at [facility], [address], [place], [place] [address]    Report Status PENDING  Incomplete     Radiology Studies: CT Angio Chest PE W and/or Wo Contrast Result Date: 05/15/2023 CLINICAL DATA:  Pulmonary embolism (PE) suspected, high prob; Sepsis EXAM: CT ANGIOGRAPHY CHEST CT ABDOMEN AND PELVIS WITH CONTRAST TECHNIQUE: Multidetector CT imaging of the chest was performed using the standard protocol during bolus administration of intravenous contrast. Multiplanar CT image reconstructions and MIPs were obtained to evaluate the vascular anatomy. Multidetector CT imaging of the abdomen and pelvis was performed using the standard protocol during bolus administration of intravenous contrast. RADIATION DOSE REDUCTION: This exam was performed according to the departmental dose-optimization program which includes automated exposure control, adjustment of the mA and/or kV according to patient size and/or use of iterative reconstruction technique. CONTRAST:  75mL OMNIPAQUE IOHEXOL 350 MG/ML SOLN COMPARISON:  Chest CT 03/21/2016.  Abdominal CT 10/11/2019. FINDINGS: CTA CHEST FINDINGS Cardiovascular: The pulmonary arteries are well opacified with contrast to the level of the segmental branches. There is no evidence of acute pulmonary embolism. No acute systemic arterial abnormalities. Suggested filling defect within the inferior left pulmonary veins (image 80/5) does not persist on the more delayed images obtained for the abdominal CT and is attributed to inflow artifact. The heart size is normal. There is no pericardial effusion. Mediastinum/Nodes: There are no enlarged mediastinal, hilar or axillary lymph  nodes. The thyroid gland, trachea and esophagus demonstrate no significant findings. Lungs/Pleura: No pleural effusion or pneumothorax. Mild dependent atelectasis at both lung bases. There are stable scattered small pulmonary nodules, including a perifissural nodule along the minor fissure and a subpleural nodule in the left lower lobe measuring 5 mm on image 92/6. These are consistent with benign findings based on stability; no follow-up imaging recommended. Musculoskeletal/Chest wall: No chest wall mass or suspicious osseous findings. Stable left-sided bone island. CT ABDOMEN AND PELVIS FINDINGS Hepatobiliary: The liver is normal in density without suspicious focal abnormality. Punctate calcified gallstone. No gallbladder wall thickening or biliary dilatation. Pancreas: Unremarkable. No pancreatic ductal dilatation or surrounding inflammatory changes. Spleen: Normal in size without focal abnormality. Adrenals/Urinary Tract: Both adrenal glands appear normal. There is a nonobstructing calculus in the lower pole infundibulum of the left kidney, measuring 10 mm on image 48/6, similar to previous CT. Mildly heterogeneous enhancement in the lower pole of the left kidney, best seen on the delayed post-contrast images, suspicious for pyelonephritis. No focal perinephric fluid collection. No evidence of ureteral calculus or hydronephrosis. Trace air in the urinary bladder lumen, possibly iatrogenic. Mild bladder wall thickening. Stomach/Bowel: No enteric contrast administered. The stomach appears unremarkable for its degree of distension. No evidence of bowel wall thickening, distention or surrounding inflammatory change. Mildly prominent stool in the colon. Vascular/Lymphatic: There are no enlarged abdominal or pelvic lymph nodes. Mild aortic and branch vessel atherosclerosis without evidence of aneurysm or large vessel occlusion. Reproductive: Marked enlargement of the prostate gland protruding into the bladder base.  Other: No evidence of abdominal wall mass or hernia. No ascites or pneumoperitoneum. Musculoskeletal: No acute or significant osseous findings. Lower lumbar spondylosis. Review of the MIP images confirms the above findings. IMPRESSION: 1. No evidence of acute pulmonary embolism or other acute vascular findings in the chest. 2. Mildly heterogeneous enhancement in the lower pole of the left kidney, suspicious for pyelonephritis. No evidence of focal perinephric fluid collection or hydronephrosis. 3. Nonobstructing calculus in the lower pole infundibulum of the left kidney. 4. Marked enlargement  of the prostate gland protruding into the bladder base. Mild bladder wall thickening and trace air in the urinary bladder lumen, possibly iatrogenic. 5. Cholelithiasis without evidence of cholecystitis or biliary dilatation. 6.  Aortic Atherosclerosis (ICD10-I70.0). Electronically Signed   By: Carey Bullocks M.D.   On: 05/15/2023 19:42   CT ABDOMEN PELVIS W CONTRAST Result Date: 05/15/2023 CLINICAL DATA:  Pulmonary embolism (PE) suspected, high prob; Sepsis EXAM: CT ANGIOGRAPHY CHEST CT ABDOMEN AND PELVIS WITH CONTRAST TECHNIQUE: Multidetector CT imaging of the chest was performed using the standard protocol during bolus administration of intravenous contrast. Multiplanar CT image reconstructions and MIPs were obtained to evaluate the vascular anatomy. Multidetector CT imaging of the abdomen and pelvis was performed using the standard protocol during bolus administration of intravenous contrast. RADIATION DOSE REDUCTION: This exam was performed according to the departmental dose-optimization program which includes automated exposure control, adjustment of the mA and/or kV according to patient size and/or use of iterative reconstruction technique. CONTRAST:  75mL OMNIPAQUE IOHEXOL 350 MG/ML SOLN COMPARISON:  Chest CT 03/21/2016.  Abdominal CT 10/11/2019. FINDINGS: CTA CHEST FINDINGS Cardiovascular: The pulmonary arteries are  well opacified with contrast to the level of the segmental branches. There is no evidence of acute pulmonary embolism. No acute systemic arterial abnormalities. Suggested filling defect within the inferior left pulmonary veins (image 80/5) does not persist on the more delayed images obtained for the abdominal CT and is attributed to inflow artifact. The heart size is normal. There is no pericardial effusion. Mediastinum/Nodes: There are no enlarged mediastinal, hilar or axillary lymph nodes. The thyroid gland, trachea and esophagus demonstrate no significant findings. Lungs/Pleura: No pleural effusion or pneumothorax. Mild dependent atelectasis at both lung bases. There are stable scattered small pulmonary nodules, including a perifissural nodule along the minor fissure and a subpleural nodule in the left lower lobe measuring 5 mm on image 92/6. These are consistent with benign findings based on stability; no follow-up imaging recommended. Musculoskeletal/Chest wall: No chest wall mass or suspicious osseous findings. Stable left-sided bone island. CT ABDOMEN AND PELVIS FINDINGS Hepatobiliary: The liver is normal in density without suspicious focal abnormality. Punctate calcified gallstone. No gallbladder wall thickening or biliary dilatation. Pancreas: Unremarkable. No pancreatic ductal dilatation or surrounding inflammatory changes. Spleen: Normal in size without focal abnormality. Adrenals/Urinary Tract: Both adrenal glands appear normal. There is a nonobstructing calculus in the lower pole infundibulum of the left kidney, measuring 10 mm on image 48/6, similar to previous CT. Mildly heterogeneous enhancement in the lower pole of the left kidney, best seen on the delayed post-contrast images, suspicious for pyelonephritis. No focal perinephric fluid collection. No evidence of ureteral calculus or hydronephrosis. Trace air in the urinary bladder lumen, possibly iatrogenic. Mild bladder wall thickening.  Stomach/Bowel: No enteric contrast administered. The stomach appears unremarkable for its degree of distension. No evidence of bowel wall thickening, distention or surrounding inflammatory change. Mildly prominent stool in the colon. Vascular/Lymphatic: There are no enlarged abdominal or pelvic lymph nodes. Mild aortic and branch vessel atherosclerosis without evidence of aneurysm or large vessel occlusion. Reproductive: Marked enlargement of the prostate gland protruding into the bladder base. Other: No evidence of abdominal wall mass or hernia. No ascites or pneumoperitoneum. Musculoskeletal: No acute or significant osseous findings. Lower lumbar spondylosis. Review of the MIP images confirms the above findings. IMPRESSION: 1. No evidence of acute pulmonary embolism or other acute vascular findings in the chest. 2. Mildly heterogeneous enhancement in the lower pole of the left kidney, suspicious for pyelonephritis.  No evidence of focal perinephric fluid collection or hydronephrosis. 3. Nonobstructing calculus in the lower pole infundibulum of the left kidney. 4. Marked enlargement of the prostate gland protruding into the bladder base. Mild bladder wall thickening and trace air in the urinary bladder lumen, possibly iatrogenic. 5. Cholelithiasis without evidence of cholecystitis or biliary dilatation. 6.  Aortic Atherosclerosis (ICD10-I70.0). Electronically Signed   By: Carey Bullocks M.D.   On: 05/15/2023 19:42   DG Chest 2 View Result Date: 05/15/2023 CLINICAL DATA:  Sepsis EXAM: CHEST - 2 VIEW COMPARISON:  05/14/2023 x-ray.  CT 03/21/2016 FINDINGS: The heart size and mediastinal contours are within normal limits. Hyperinflation. No consolidation, pneumothorax or effusion. No edema. Normal cardiopericardial silhouette. Calcified aorta. Old left-sided rib fractures. On the lateral view the posteroinferior costophrenic angles are clipped off the edge of the film. IMPRESSION: Hyperinflation with chronic  changes. Electronically Signed   By: Karen Kays M.D.   On: 05/15/2023 17:21    Scheduled Meds:  finasteride  5 mg Oral Daily   heparin  5,000 Units Subcutaneous Q8H   insulin aspart  0-15 Units Subcutaneous TID WC   insulin aspart  0-5 Units Subcutaneous QHS   Continuous Infusions:  sodium chloride 100 mL/hr at 05/16/23 0754   piperacillin-tazobactam (ZOSYN)  IV 3.375 g (05/16/23 0756)   [START ON 05/17/2023] vancomycin       LOS: 1 day   Burnadette Pop, MD [facility] P3/21/2025, 7:58 AM

## 2023-05-16 NOTE — Progress Notes (Signed)
 Pharmacy Antibiotic Note  Charles Strong is a 83 y.o. male admitted on 05/15/2023 with fevers/pyelonephritis.  Pharmacy has been consulted for Vancomycin and Zosyn  dosing.  Plan: Vancomycin 1000 mg IV now, then Vancomycin 750 mg IV q24h Zosyn 3.375 g IV q8h   Height: 5\' 10"  (177.8 cm) Weight: 65.8 kg (145 lb) IBW/kg (Calculated) : 73  Temp (24hrs), Avg:99.9 F (37.7 C), Min:97.8 F (36.6 C), Max:102.9 F (39.4 C)  Recent Labs  Lab 05/15/23 1637 05/15/23 1655 05/15/23 1845 05/16/23 0200  WBC 9.9  --   --   --   CREATININE 1.69*  --   --  1.55*  LATICACIDVEN  --  2.0* 1.3  --     Estimated Creatinine Clearance: 35.4 mL/min (A) (by C-G formula based on SCr of 1.55 mg/dL (H)).    Allergies  Allergen Reactions   Calcium Fortified Cookie [Nutritional Supplements] Diarrhea   Egg-Derived Products Diarrhea   Metformin And Related Diarrhea    Eddie Candle 05/16/2023 5:25 AM

## 2023-05-16 NOTE — ED Notes (Signed)
 PT has a fever of 102.8 F orally. PT given tylenol 650mg 

## 2023-05-17 ENCOUNTER — Other Ambulatory Visit (HOSPITAL_COMMUNITY): Payer: Self-pay

## 2023-05-17 DIAGNOSIS — R6520 Severe sepsis without septic shock: Secondary | ICD-10-CM

## 2023-05-17 DIAGNOSIS — D696 Thrombocytopenia, unspecified: Secondary | ICD-10-CM

## 2023-05-17 DIAGNOSIS — I2489 Other forms of acute ischemic heart disease: Secondary | ICD-10-CM | POA: Diagnosis not present

## 2023-05-17 DIAGNOSIS — N12 Tubulo-interstitial nephritis, not specified as acute or chronic: Secondary | ICD-10-CM | POA: Diagnosis not present

## 2023-05-17 DIAGNOSIS — A419 Sepsis, unspecified organism: Secondary | ICD-10-CM

## 2023-05-17 DIAGNOSIS — N179 Acute kidney failure, unspecified: Secondary | ICD-10-CM | POA: Diagnosis not present

## 2023-05-17 DIAGNOSIS — R7989 Other specified abnormal findings of blood chemistry: Secondary | ICD-10-CM | POA: Diagnosis not present

## 2023-05-17 DIAGNOSIS — E063 Autoimmune thyroiditis: Secondary | ICD-10-CM

## 2023-05-17 LAB — BASIC METABOLIC PANEL
Anion gap: 7 (ref 5–15)
BUN: 22 mg/dL (ref 8–23)
CO2: 20 mmol/L — ABNORMAL LOW (ref 22–32)
Calcium: 8 mg/dL — ABNORMAL LOW (ref 8.9–10.3)
Chloride: 111 mmol/L (ref 98–111)
Creatinine, Ser: 1.08 mg/dL (ref 0.61–1.24)
GFR, Estimated: >60 mL/min (ref >60)
Glucose, Bld: 156 mg/dL — ABNORMAL HIGH (ref 70–99)
Potassium: 3.6 mmol/L (ref 3.5–5.1)
Sodium: 138 mmol/L (ref 135–145)

## 2023-05-17 LAB — CBC
HCT: 39.3 % (ref 39.0–52.0)
Hemoglobin: 13.2 g/dL (ref 13.0–17.0)
MCH: 29 pg (ref 26.0–34.0)
MCHC: 33.6 g/dL (ref 30.0–36.0)
MCV: 86.4 fL (ref 80.0–100.0)
Platelets: 115 10*3/uL — ABNORMAL LOW (ref 150–400)
RBC: 4.55 MIL/uL (ref 4.22–5.81)
RDW: 14.5 % (ref 11.5–15.5)
WBC: 9.5 10*3/uL (ref 4.0–10.5)
nRBC: 0 % (ref 0.0–0.2)

## 2023-05-17 LAB — GLUCOSE, CAPILLARY
Glucose-Capillary: 137 mg/dL — ABNORMAL HIGH (ref 70–99)
Glucose-Capillary: 186 mg/dL — ABNORMAL HIGH (ref 70–99)

## 2023-05-17 MED ORDER — CEFADROXIL 500 MG PO CAPS: 1000.0000 mg | ORAL_CAPSULE | Freq: Two times a day (BID) | ORAL | Status: DC

## 2023-05-17 MED ORDER — CEFADROXIL 500 MG PO CAPS
1000.0000 mg | ORAL_CAPSULE | Freq: Two times a day (BID) | ORAL | 0 refills | Status: AC
  Filled 2023-05-17: qty 16, 4d supply, fill #0

## 2023-05-17 NOTE — Discharge Summary (Signed)
 Physician Discharge Summary  Charles Strong ZOX:[id number] DOB: Mar 20, 1940 DOA: 05/15/2023  PCP: Charles Ishihara, MD  Admit date: 05/15/2023 Discharge date: 05/17/2023  Admitted From: Home Disposition:  Home  Discharge Condition:Stable CODE STATUS:FULL Diet recommendation:  Carb Modified   Brief/Interim Summary: Patient is a 83 year old male with history of diabetes type 2, BPH who presented with chills, fever, abdominal pain.  He was diagnosed with UTI as an outpatient and was started on Cipro by his PCP.  On presentation, he was hypertensive, tachycardic.  Lab work showed creatinine of 1.6(baseline creatinine normal), this is of 12.8, lactic acid of 2.  UA was suspicious for UTI.  CT abdomen/pelvis showed possible  left-sided pyonephritis.  Blood culture sent.  Started on ceftriaxone .  He has remarkably improved clinically.  Blood cultures have been negative so far, urine culture did not show any growth.  He has been afebrile since more than 24 hours.  Patient is medically stable for discharge with oral antibiotics.  Following problems were addressed during the hospitalization:  Acute left-sided pyonephritis: Presented with fever, chills, tachycardia.  Recently started on Cipro by his PCP for UTI.  UA was suspicious for UTI.,  CT suspicious for left-sided pyelonephritis. Blood cultures have been negative so far, urine culture did not show any growth.  He has been afebrile since more than 24 hours.     Lactic acidosis resolved. Antibiotics changed to oral   AKI: Resolved with IV fluid   Elevated troponin: Likely from demand ischemia likely from sepsis from pyelonephritis.  2D echo does not show wall motion abnormality, normal EF.  No chest pain.  Troponin trended down.  Cardiology recommended outpatient follow-up  Diabetes type 2: A1c of 7.3.  Takes metformin, glipizide at home.  Farxiga discontinued due to UTI/pyelonephritis   Elevated TSH: Free T4 normal.  Will recommend to  follow-up with PCP in 1 to 2 weeks, repeat thyroid function test in 4 to 6 weeks.  Thyroid function test could be inaccurate in the setting of acute illness.  He does not have any symptoms of hypothyroidism   BPH: On Flomax    Discharge Diagnoses:  Principal Problem:   Pyelonephritis Active Problems:   AKI (acute kidney injury) (HCC)   Lactic acid acidosis   Elevated troponin   Elevated LFTs   BPH (benign prostatic hyperplasia)   Hypothyroidism   T2DM (type 2 diabetes mellitus) (HCC)   Demand ischemia (HCC)   Thrombocytopenia (HCC)   Hashimoto's thyroiditis   Sepsis with acute renal failure without septic shock [facility])    Discharge Instructions  Discharge Instructions     Diet Carb Modified   Complete by: As directed    Discharge instructions   Complete by: As directed    1)Please take prescribed medications as instructed 2)Follow up with your PCP in a week.  Do a thyroid function test in 4 to 6 weeks during the visit to your PCP 3)Follow up with cardiology as an outpatient.  You have an appointment on April 29   Increase activity slowly   Complete by: As directed       Allergies as of 05/17/2023       Reactions   Calcium Fortified Cookie [nutritional Supplements] Diarrhea   Egg-derived Products Diarrhea   Metformin And Related Diarrhea        Medication List     STOP taking these medications    ciprofloxacin 500 MG tablet Commonly known as: CIPRO   Farxiga 5 MG Tabs tablet Generic  drug: dapagliflozin propanediol       TAKE these medications    acetaminophen 500 MG tablet Commonly known as: TYLENOL Take 500 mg by mouth every 6 (six) hours as needed for mild pain (pain score 1-3), fever or headache.   APPLE CIDER VINEGAR PO Take 30 mLs by mouth daily.   BERBERINE COMPLEX PO Take 2 capsules by mouth 2 (two) times daily.   cefadroxil 500 MG capsule Commonly known as: DURICEF Take 2 capsules (1,000 mg total) by mouth 2 (two) times daily for 4  days. Start taking on: May 18, 2023   finasteride 5 MG tablet Commonly known as: PROSCAR Take 5 mg by mouth daily.   fluticasone 50 MCG/ACT nasal spray Commonly known as: FLONASE PLACE 2 SPRAYS IN EACH NOSTRIL DAILY AS NEEDED FOR ALLERGIES   glipiZIDE 5 MG 24 hr tablet Commonly known as: GLUCOTROL XL Take 5 mg by mouth daily.   glucose blood test strip 1 each by Other route 3 (three) times daily. Use as instructed   metFORMIN 500 MG 24 hr tablet Commonly known as: GLUCOPHAGE-XR Take 500 mg by mouth daily with breakfast.   Nitrostat 0.4 MG SL tablet Generic drug: nitroGLYCERIN PLACE 1 TABLET UNDER THE TONGUE EVERY 5 MINUTES AS NEEDED FOR CHEST PAIN   Potassium 99 MG Tabs Take 99 mg by mouth daily.   selenium 200 MCG Tabs tablet Take 200 mcg by mouth daily.   silodosin 8 MG Caps capsule Commonly known as: RAPAFLO Take 8 mg by mouth daily with breakfast.   thiamine 100 MG tablet Commonly known as: Vitamin B-1 Take 100 mg by mouth daily.   TURMERIC PO Take 400 mg by mouth daily.   vitamin B-12 100 MCG tablet Commonly known as: CYANOCOBALAMIN Take 100 mcg by mouth daily.        Follow-up Information     Charles Grapes, NP Follow up on 06/24/2023.   Specialties: Cardiology, Family Medicine Why: Cardiology Hospital Follow-up on 06/24/2023 at 3:35 PM with Charles Person, NP (works with Dr. Duke Strong). Contact information: [address] [place] [address] [phone]         Charles Ishihara, MD. Schedule an appointment as soon as possible for a visit in 1 week(s).   Specialty: Internal Medicine Contact information: [address] [place] [address] [phone]                Allergies  Allergen Reactions   Calcium Fortified Cookie [Nutritional Supplements] Diarrhea   Egg-Derived Products Diarrhea   Metformin And Related Diarrhea    Consultations: None   Procedures/Studies: ECHOCARDIOGRAM  COMPLETE Result Date: 05/16/2023    ECHOCARDIOGRAM REPORT   Patient Name:   Charles Strong Date of Exam: 05/16/2023 Medical Rec #:  [id number]         Height:       70.0 in Accession #:    [id number]        Weight:       143.2 lb Date of Birth:  [birth year]         BSA:          1.811 m Patient Age:    82 years          BP:           125/71 mmHg Patient Gender: M                 HR:  64 bpm. Exam Location:  Inpatient Procedure: 2D Echo, Cardiac Doppler and Color Doppler (Both Spectral and Color            Flow Doppler were utilized during procedure). Indications:    Elevated Troponin  History:        Patient has no prior history of Echocardiogram examinations.                 Signs/Symptoms:Chest Pain; Risk Factors:Diabetes and                 Dyslipidemia.  Sonographer:    Lucendia Herrlich RCS Referring Phys: [phone] DEBBY CROSLEY IMPRESSIONS  1. Left ventricular ejection fraction, by estimation, is 60 to 65%. Left ventricular ejection fraction by PLAX is 61 %. The left ventricle has normal function. The left ventricle has no regional wall motion abnormalities. Left ventricular diastolic parameters are consistent with Grade I diastolic dysfunction (impaired relaxation).  2. Right ventricular systolic function is normal. The right ventricular size is normal. The estimated right ventricular systolic pressure is 20.0 mmHg.  3. The mitral valve is grossly normal. Trivial mitral valve regurgitation.  4. The aortic valve is tricuspid. Aortic valve regurgitation is not visualized.  5. The inferior vena cava is dilated in size with >50% respiratory variability, suggesting right atrial pressure of 8 mmHg. Comparison(s): No prior Echocardiogram. FINDINGS  Left Ventricle: Left ventricular ejection fraction, by estimation, is 60 to 65%. Left ventricular ejection fraction by PLAX is 61 %. The left ventricle has normal function. The left ventricle has no regional wall motion abnormalities. The left ventricular internal  cavity size was normal in size. There is no left ventricular hypertrophy. Left ventricular diastolic parameters are consistent with Grade I diastolic dysfunction (impaired relaxation). Indeterminate filling pressures. Right Ventricle: The right ventricular size is normal. No increase in right ventricular wall thickness. Right ventricular systolic function is normal. The tricuspid regurgitant velocity is 1.73 m/s, and with an assumed right atrial pressure of 8 mmHg, the estimated right ventricular systolic pressure is 20.0 mmHg. Left Atrium: Left atrial size was normal in size. Right Atrium: Right atrial size was normal in size. Pericardium: There is no evidence of pericardial effusion. Mitral Valve: The mitral valve is grossly normal. Trivial mitral valve regurgitation. Tricuspid Valve: The tricuspid valve is grossly normal. Tricuspid valve regurgitation is trivial. Aortic Valve: The aortic valve is tricuspid. Aortic valve regurgitation is not visualized. Aortic valve peak gradient measures 6.2 mmHg. Pulmonic Valve: The pulmonic valve was normal in structure. Pulmonic valve regurgitation is not visualized. Aorta: The aortic root and ascending aorta are structurally normal, with no evidence of dilitation. Venous: The inferior vena cava is dilated in size with greater than 50% respiratory variability, suggesting right atrial pressure of 8 mmHg. IAS/Shunts: No atrial level shunt detected by color flow Doppler.  LEFT VENTRICLE PLAX 2D LV EF:         Left            Diastology                ventricular     LV e' medial:    7.72 cm/s                ejection        LV E/e' medial:  7.3                fraction by     LV e' lateral:   9.46 cm/s  PLAX is 61      LV E/e' lateral: 6.0                %. LVIDd:         4.30 cm LVIDs:         2.90 cm LV PW:         0.70 cm LV IVS:        0.80 cm LVOT diam:     2.00 cm LV SV:         53 LV SV Index:   29 LVOT Area:     3.14 cm  RIGHT VENTRICLE             IVC RV S  prime:     11.30 cm/s  IVC diam: 2.40 cm TAPSE (M-mode): 2.2 cm LEFT ATRIUM             Index        RIGHT ATRIUM           Index LA diam:        3.60 cm 1.99 cm/m   RA Area:     15.00 cm LA Vol (A2C):   44.0 ml 24.30 ml/m  RA Volume:   34.60 ml  19.11 ml/m LA Vol (A4C):   30.2 ml 16.68 ml/m LA Biplane Vol: 39.2 ml 21.65 ml/m  AORTIC VALVE AV Area (Vmax): 2.18 cm AV Vmax:        124.00 cm/s AV Peak Grad:   6.2 mmHg LVOT Vmax:      85.90 cm/s LVOT Vmean:     56.600 cm/s LVOT VTI:       0.170 m  AORTA Ao Root diam: 3.30 cm Ao Asc diam:  3.00 cm MITRAL VALVE               TRICUSPID VALVE MV Area (PHT): 3.99 cm    TR Peak grad:   12.0 mmHg MV Decel Time: 190 msec    TR Vmax:        173.00 cm/s MV E velocity: 56.60 cm/s MV A velocity: 59.70 cm/s  SHUNTS MV E/A ratio:  0.95        Systemic VTI:  0.17 m                            Systemic Diam: 2.00 cm Zoila Shutter MD Electronically signed by Zoila Shutter MD Signature Date/Time: 05/16/2023/5:54:39 PM    Final    CT Angio Chest PE W and/or Wo Contrast Result Date: 05/15/2023 CLINICAL DATA:  Pulmonary embolism (PE) suspected, high prob; Sepsis EXAM: CT ANGIOGRAPHY CHEST CT ABDOMEN AND PELVIS WITH CONTRAST TECHNIQUE: Multidetector CT imaging of the chest was performed using the standard protocol during bolus administration of intravenous contrast. Multiplanar CT image reconstructions and MIPs were obtained to evaluate the vascular anatomy. Multidetector CT imaging of the abdomen and pelvis was performed using the standard protocol during bolus administration of intravenous contrast. RADIATION DOSE REDUCTION: This exam was performed according to the departmental dose-optimization program which includes automated exposure control, adjustment of the mA and/or kV according to patient size and/or use of iterative reconstruction technique. CONTRAST:  75mL OMNIPAQUE IOHEXOL 350 MG/ML SOLN COMPARISON:  Chest CT 03/21/2016.  Abdominal CT 10/11/2019. FINDINGS: CTA CHEST  FINDINGS Cardiovascular: The pulmonary arteries are well opacified with contrast to the level of the segmental branches. There is no evidence of acute pulmonary embolism. No acute systemic arterial abnormalities.  Suggested filling defect within the inferior left pulmonary veins (image 80/5) does not persist on the more delayed images obtained for the abdominal CT and is attributed to inflow artifact. The heart size is normal. There is no pericardial effusion. Mediastinum/Nodes: There are no enlarged mediastinal, hilar or axillary lymph nodes. The thyroid gland, trachea and esophagus demonstrate no significant findings. Lungs/Pleura: No pleural effusion or pneumothorax. Mild dependent atelectasis at both lung bases. There are stable scattered small pulmonary nodules, including a perifissural nodule along the minor fissure and a subpleural nodule in the left lower lobe measuring 5 mm on image 92/6. These are consistent with benign findings based on stability; no follow-up imaging recommended. Musculoskeletal/Chest wall: No chest wall mass or suspicious osseous findings. Stable left-sided bone island. CT ABDOMEN AND PELVIS FINDINGS Hepatobiliary: The liver is normal in density without suspicious focal abnormality. Punctate calcified gallstone. No gallbladder wall thickening or biliary dilatation. Pancreas: Unremarkable. No pancreatic ductal dilatation or surrounding inflammatory changes. Spleen: Normal in size without focal abnormality. Adrenals/Urinary Tract: Both adrenal glands appear normal. There is a nonobstructing calculus in the lower pole infundibulum of the left kidney, measuring 10 mm on image 48/6, similar to previous CT. Mildly heterogeneous enhancement in the lower pole of the left kidney, best seen on the delayed post-contrast images, suspicious for pyelonephritis. No focal perinephric fluid collection. No evidence of ureteral calculus or hydronephrosis. Trace air in the urinary bladder lumen, possibly  iatrogenic. Mild bladder wall thickening. Stomach/Bowel: No enteric contrast administered. The stomach appears unremarkable for its degree of distension. No evidence of bowel wall thickening, distention or surrounding inflammatory change. Mildly prominent stool in the colon. Vascular/Lymphatic: There are no enlarged abdominal or pelvic lymph nodes. Mild aortic and branch vessel atherosclerosis without evidence of aneurysm or large vessel occlusion. Reproductive: Marked enlargement of the prostate gland protruding into the bladder base. Other: No evidence of abdominal wall mass or hernia. No ascites or pneumoperitoneum. Musculoskeletal: No acute or significant osseous findings. Lower lumbar spondylosis. Review of the MIP images confirms the above findings. IMPRESSION: 1. No evidence of acute pulmonary embolism or other acute vascular findings in the chest. 2. Mildly heterogeneous enhancement in the lower pole of the left kidney, suspicious for pyelonephritis. No evidence of focal perinephric fluid collection or hydronephrosis. 3. Nonobstructing calculus in the lower pole infundibulum of the left kidney. 4. Marked enlargement of the prostate gland protruding into the bladder base. Mild bladder wall thickening and trace air in the urinary bladder lumen, possibly iatrogenic. 5. Cholelithiasis without evidence of cholecystitis or biliary dilatation. 6.  Aortic Atherosclerosis (ICD10-I70.0). Electronically Signed   By: Carey Bullocks M.D.   On: 05/15/2023 19:42   CT ABDOMEN PELVIS W CONTRAST Result Date: 05/15/2023 CLINICAL DATA:  Pulmonary embolism (PE) suspected, high prob; Sepsis EXAM: CT ANGIOGRAPHY CHEST CT ABDOMEN AND PELVIS WITH CONTRAST TECHNIQUE: Multidetector CT imaging of the chest was performed using the standard protocol during bolus administration of intravenous contrast. Multiplanar CT image reconstructions and MIPs were obtained to evaluate the vascular anatomy. Multidetector CT imaging of the abdomen  and pelvis was performed using the standard protocol during bolus administration of intravenous contrast. RADIATION DOSE REDUCTION: This exam was performed according to the departmental dose-optimization program which includes automated exposure control, adjustment of the mA and/or kV according to patient size and/or use of iterative reconstruction technique. CONTRAST:  75mL OMNIPAQUE IOHEXOL 350 MG/ML SOLN COMPARISON:  Chest CT 03/21/2016.  Abdominal CT 10/11/2019. FINDINGS: CTA CHEST FINDINGS Cardiovascular: The pulmonary arteries are  well opacified with contrast to the level of the segmental branches. There is no evidence of acute pulmonary embolism. No acute systemic arterial abnormalities. Suggested filling defect within the inferior left pulmonary veins (image 80/5) does not persist on the more delayed images obtained for the abdominal CT and is attributed to inflow artifact. The heart size is normal. There is no pericardial effusion. Mediastinum/Nodes: There are no enlarged mediastinal, hilar or axillary lymph nodes. The thyroid gland, trachea and esophagus demonstrate no significant findings. Lungs/Pleura: No pleural effusion or pneumothorax. Mild dependent atelectasis at both lung bases. There are stable scattered small pulmonary nodules, including a perifissural nodule along the minor fissure and a subpleural nodule in the left lower lobe measuring 5 mm on image 92/6. These are consistent with benign findings based on stability; no follow-up imaging recommended. Musculoskeletal/Chest wall: No chest wall mass or suspicious osseous findings. Stable left-sided bone island. CT ABDOMEN AND PELVIS FINDINGS Hepatobiliary: The liver is normal in density without suspicious focal abnormality. Punctate calcified gallstone. No gallbladder wall thickening or biliary dilatation. Pancreas: Unremarkable. No pancreatic ductal dilatation or surrounding inflammatory changes. Spleen: Normal in size without focal abnormality.  Adrenals/Urinary Tract: Both adrenal glands appear normal. There is a nonobstructing calculus in the lower pole infundibulum of the left kidney, measuring 10 mm on image 48/6, similar to previous CT. Mildly heterogeneous enhancement in the lower pole of the left kidney, best seen on the delayed post-contrast images, suspicious for pyelonephritis. No focal perinephric fluid collection. No evidence of ureteral calculus or hydronephrosis. Trace air in the urinary bladder lumen, possibly iatrogenic. Mild bladder wall thickening. Stomach/Bowel: No enteric contrast administered. The stomach appears unremarkable for its degree of distension. No evidence of bowel wall thickening, distention or surrounding inflammatory change. Mildly prominent stool in the colon. Vascular/Lymphatic: There are no enlarged abdominal or pelvic lymph nodes. Mild aortic and branch vessel atherosclerosis without evidence of aneurysm or large vessel occlusion. Reproductive: Marked enlargement of the prostate gland protruding into the bladder base. Other: No evidence of abdominal wall mass or hernia. No ascites or pneumoperitoneum. Musculoskeletal: No acute or significant osseous findings. Lower lumbar spondylosis. Review of the MIP images confirms the above findings. IMPRESSION: 1. No evidence of acute pulmonary embolism or other acute vascular findings in the chest. 2. Mildly heterogeneous enhancement in the lower pole of the left kidney, suspicious for pyelonephritis. No evidence of focal perinephric fluid collection or hydronephrosis. 3. Nonobstructing calculus in the lower pole infundibulum of the left kidney. 4. Marked enlargement of the prostate gland protruding into the bladder base. Mild bladder wall thickening and trace air in the urinary bladder lumen, possibly iatrogenic. 5. Cholelithiasis without evidence of cholecystitis or biliary dilatation. 6.  Aortic Atherosclerosis (ICD10-I70.0). Electronically Signed   By: Carey Bullocks M.D.    On: 05/15/2023 19:42   DG Chest 2 View Result Date: 05/15/2023 CLINICAL DATA:  Sepsis EXAM: CHEST - 2 VIEW COMPARISON:  05/14/2023 x-ray.  CT 03/21/2016 FINDINGS: The heart size and mediastinal contours are within normal limits. Hyperinflation. No consolidation, pneumothorax or effusion. No edema. Normal cardiopericardial silhouette. Calcified aorta. Old left-sided rib fractures. On the lateral view the posteroinferior costophrenic angles are clipped off the edge of the film. IMPRESSION: Hyperinflation with chronic changes. Electronically Signed   By: Karen Kays M.D.   On: 05/15/2023 17:21      Subjective: Patient seen and examined at bedside today.  Hemodynamically stable for discharge.   No abdomen pain, no fever or chills.  Eager to  go home  Discharge Exam: Vitals:   05/17/23 0400 05/17/23 0706  BP: 111/71 131/71  Pulse: 80 65  Resp: (!) 21   Temp: 98.7 F (37.1 C) 97.6 F (36.4 C)  SpO2: 96% 95%   Vitals:   05/16/23 1925 05/16/23 2322 05/17/23 0400 05/17/23 0706  BP: 114/64 111/72 111/71 131/71  Pulse: 78 73 80 65  Resp: 20 18 (!) 21   Temp: 98 F (36.7 C) 98.2 F (36.8 C) 98.7 F (37.1 C) 97.6 F (36.4 C)  TempSrc: Oral Oral Oral Oral  SpO2: 96% 96% 96% 95%  Weight:      Height:        General: Pt is alert, awake, not in acute distress Cardiovascular: RRR, S1/S2 +, no rubs, no gallops Respiratory: CTA bilaterally, no wheezing, no rhonchi Abdominal: Soft, NT, ND, bowel sounds + Extremities: no edema, no cyanosis    The results of significant diagnostics from this hospitalization (including imaging, microbiology, ancillary and laboratory) are listed below for reference.     Microbiology: Recent Results (from the past 240 hours)  Urine Culture     Status: None   Collection Time: 05/15/23  4:30 PM   Specimen: Urine, Clean Catch  Result Value Ref Range Status   Specimen Description URINE, CLEAN CATCH  Final   Special Requests NONE Reflexed from [id number]  Final    Culture   Final    NO GROWTH Performed at [facility], [address] [place], [place] [address]    Report Status 05/16/2023 FINAL  Final  Blood culture (routine x 2)     Status: None (Preliminary result)   Collection Time: 05/15/23  5:15 PM   Specimen: BLOOD  Result Value Ref Range Status   Specimen Description BLOOD LEFT ANTECUBITAL  Final   Special Requests   Final    BOTTLES DRAWN AEROBIC AND ANAEROBIC Blood Culture results may not be optimal due to an inadequate volume of blood received in culture bottles   Culture   Final    NO GROWTH 2 DAYS Performed at [facility], [address] [place], [place] [address]    Report Status PENDING  Incomplete  Blood culture (routine x 2)     Status: None (Preliminary result)   Collection Time: 05/15/23  5:40 PM   Specimen: BLOOD  Result Value Ref Range Status   Specimen Description BLOOD RIGHT ANTECUBITAL  Final   Special Requests   Final    BOTTLES DRAWN AEROBIC ONLY Blood Culture results may not be optimal due to an inadequate volume of blood received in culture bottles   Culture   Final    NO GROWTH 2 DAYS Performed at [facility], [address], [place], [place] [address]    Report Status PENDING  Incomplete  MRSA Next Gen by PCR, Nasal     Status: None   Collection Time: 05/16/23  3:55 PM   Specimen: Nasal Mucosa; Nasal Swab  Result Value Ref Range Status   MRSA by PCR Next Gen NOT DETECTED NOT DETECTED Final    Comment: (NOTE) The GeneXpert MRSA Assay (FDA approved for NASAL specimens only), is one component of a comprehensive MRSA colonization surveillance program. It is not intended to diagnose MRSA infection nor to guide or monitor treatment for MRSA infections. Test performance is not FDA approved in patients less than 29 years old. Performed at [facility], [address], [place], [place] [address]  Labs: BNP (last 3 results) No results for input(s): "BNP" in the last 8760  hours. Basic Metabolic Panel: Recent Labs  Lab 05/15/23 1637 05/16/23 0200 05/16/23 0613 05/17/23 0214  NA 133* 134*  --  138  K 3.7 3.9  --  3.6  CL 101 103  --  111  CO2 17* 16*  --  20*  GLUCOSE 251* 138*  --  156*  BUN 29* 24*  --  22  CREATININE 1.69* 1.55*  --  1.08  CALCIUM 8.6* 8.2*  --  8.0*  MG  --   --  2.3  --    Liver Function Tests: Recent Labs  Lab 05/15/23 1637 05/16/23 0200  AST 38 27  ALT 22 19  ALKPHOS 222* 171*  BILITOT 1.6* 1.5*  PROT 6.8 6.4*  ALBUMIN 3.1* 2.7*   No results for input(s): "LIPASE", "AMYLASE" in the last 168 hours. No results for input(s): "AMMONIA" in the last 168 hours. CBC: Recent Labs  Lab 05/15/23 1637 05/17/23 0214  WBC 9.9 9.5  NEUTROABS 9.2*  --   HGB 15.8 13.2  HCT 47.8 39.3  MCV 88.4 86.4  PLT 120* 115*   Cardiac Enzymes: No results for input(s): "CKTOTAL", "CKMB", "CKMBINDEX", "TROPONINI" in the last 168 hours. BNP: Invalid input(s): "POCBNP" CBG: Recent Labs  Lab 05/16/23 0739 05/16/23 1113 05/16/23 1612 05/16/23 2105 05/17/23 0636  GLUCAP 179* 253* 178* 222* 137*   D-Dimer No results for input(s): "DDIMER" in the last 72 hours. Hgb A1c Recent Labs    05/16/23 0613  HGBA1C 7.3*   Lipid Profile No results for input(s): "CHOL", "HDL", "LDLCALC", "TRIG", "CHOLHDL", "LDLDIRECT" in the last 72 hours. Thyroid function studies Recent Labs    05/15/23 1637  TSH 12.847*   Anemia work up No results for input(s): "VITAMINB12", "FOLATE", "FERRITIN", "TIBC", "IRON", "RETICCTPCT" in the last 72 hours. Urinalysis    Component Value Date/Time   COLORURINE AMBER (A) 05/15/2023 1630   APPEARANCEUR HAZY (A) 05/15/2023 1630   LABSPEC 1.023 05/15/2023 1630   PHURINE 5.0 05/15/2023 1630   GLUCOSEU >=500 (A) 05/15/2023 1630   HGBUR SMALL (A) 05/15/2023 1630   BILIRUBINUR NEGATIVE 05/15/2023 1630   KETONESUR 20 (A) 05/15/2023 1630   PROTEINUR NEGATIVE 05/15/2023 1630   NITRITE NEGATIVE 05/15/2023 1630    LEUKOCYTESUR SMALL (A) 05/15/2023 1630   Sepsis Labs Recent Labs  Lab 05/15/23 1637 05/17/23 0214  WBC 9.9 9.5   Microbiology Recent Results (from the past 240 hours)  Urine Culture     Status: None   Collection Time: 05/15/23  4:30 PM   Specimen: Urine, Clean Catch  Result Value Ref Range Status   Specimen Description URINE, CLEAN CATCH  Final   Special Requests NONE Reflexed from [id number]  Final   Culture   Final    NO GROWTH Performed at [facility], [address], [place], [place] [address]    Report Status 05/16/2023 FINAL  Final  Blood culture (routine x 2)     Status: None (Preliminary result)   Collection Time: 05/15/23  5:15 PM   Specimen: BLOOD  Result Value Ref Range Status   Specimen Description BLOOD LEFT ANTECUBITAL  Final   Special Requests   Final    BOTTLES DRAWN AEROBIC AND ANAEROBIC Blood Culture results may not be optimal due to an inadequate volume of blood received in culture bottles   Culture   Final    NO GROWTH 2 DAYS Performed at [facility], [address] [place], [place] [address]    Report Status PENDING  Incomplete  Blood culture (routine x 2)     Status: None (Preliminary result)   Collection Time: 05/15/23  5:40 PM   Specimen: BLOOD  Result Value Ref Range Status   Specimen Description BLOOD RIGHT ANTECUBITAL  Final   Special Requests   Final    BOTTLES DRAWN AEROBIC ONLY Blood Culture results may not be optimal due to an inadequate volume of blood received in culture bottles   Culture   Final    NO GROWTH 2 DAYS Performed at [facility], [address] [place], [place] [address]    Report Status PENDING  Incomplete  MRSA Next Gen by PCR, Nasal     Status: None   Collection Time: 05/16/23  3:55 PM   Specimen: Nasal Mucosa; Nasal Swab  Result Value Ref Range Status   MRSA by PCR Next Gen NOT DETECTED NOT DETECTED Final    Comment: (NOTE) The GeneXpert MRSA Assay (FDA approved for NASAL specimens only), is one  component of a comprehensive MRSA colonization surveillance program. It is not intended to diagnose MRSA infection nor to guide or monitor treatment for MRSA infections. Test performance is not FDA approved in patients less than 51 years old. Performed at [facility], [address], [place], [place] [address]     Please note: You were cared for by a hospitalist during your hospital stay. Once you are discharged, your primary care physician will handle any further medical issues. Please note that NO REFILLS for any discharge medications will be authorized once you are discharged, as it is imperative that you return to your primary care physician (or establish a relationship with a primary care physician if you do not have one) for your post hospital discharge needs so that they can reassess your need for medications and monitor your lab values.    Time coordinating discharge: 40 minutes  SIGNED:   Burnadette Pop, MD  [facility] 05/17/2023, 10:32 AM Pager [phone]  If 7PM-7AM, please contact night-coverage [email or web address] Password [email or web address]

## 2023-05-17 NOTE — Progress Notes (Signed)
 Rounding Note    Patient Name: Charles Strong Date of Encounter: 05/17/2023  [facility] Cardiologist: Chilton Si, MD  Chief Complaint: Fever, chills Reason of consult: Elevated troponin  Subjective   Resting in bed comfortably. Denies anginal chest pain or heart failure symptoms. States that he has been afebrile for the last 30 hours. Wishes to be discharged  Inpatient Medications    Scheduled Meds:  finasteride  5 mg Oral Daily   heparin  5,000 Units Subcutaneous Q8H   insulin aspart  0-15 Units Subcutaneous TID WC   insulin aspart  0-5 Units Subcutaneous QHS   Continuous Infusions:  cefTRIAXone (ROCEPHIN)  IV Stopped (05/16/23 1015)   PRN Meds: acetaminophen **OR** acetaminophen, ondansetron (ZOFRAN) IV, oxyCODONE, senna-docusate   Vital Signs    Vitals:   05/16/23 1925 05/16/23 2322 05/17/23 0400 05/17/23 0706  BP: 114/64 111/72 111/71 131/71  Pulse: 78 73 80 65  Resp: 20 18 (!) 21   Temp: 98 F (36.7 C) 98.2 F (36.8 C) 98.7 F (37.1 C) 97.6 F (36.4 C)  TempSrc: Oral Oral Oral Oral  SpO2: 96% 96% 96% 95%  Weight:      Height:        Intake/Output Summary (Last 24 hours) at 05/17/2023 0911 Last data filed at 05/16/2023 1015 Gross per 24 hour  Intake 97.24 ml  Output --  Net 97.24 ml      05/16/2023    3:43 PM 05/15/2023    4:16 PM 02/03/2021    1:18 AM  Last 3 Weights  Weight (lbs) 143 lb 3.2 oz 145 lb 142 lb  Weight (kg) 64.955 kg 65.772 kg 64.411 kg      Telemetry    Sinus rhythm with no arrhythmias- Personally Reviewed  ECG    No new tracing- Personally Reviewed  Physical Exam   Physical Exam  Constitutional:  Age appropriate, hemodynamically stable, no acute distress.   Neck: No JVD present.  Cardiovascular: Normal rate, regular rhythm, S1 normal and S2 normal. Exam reveals no gallop and no friction rub.  No murmur heard. Pulmonary/Chest: Breath sounds normal. He has no wheezes. He has no rales. He exhibits  no tenderness.  Abdominal: Soft. Bowel sounds are normal. He exhibits no distension. There is no abdominal tenderness.  Musculoskeletal:        General: No tenderness or edema.  Skin: Skin is warm and dry.    Labs    High Sensitivity Troponin:   Recent Labs  Lab 05/15/23 1637 05/15/23 1840  TROPONINIHS 293* 179*     Chemistry Recent Labs  Lab 05/15/23 1637 05/16/23 0200 05/16/23 0613 05/17/23 0214  NA 133* 134*  --  138  K 3.7 3.9  --  3.6  CL 101 103  --  111  CO2 17* 16*  --  20*  GLUCOSE 251* 138*  --  156*  BUN 29* 24*  --  22  CREATININE 1.69* 1.55*  --  1.08  CALCIUM 8.6* 8.2*  --  8.0*  MG  --   --  2.3  --   PROT 6.8 6.4*  --   --   ALBUMIN 3.1* 2.7*  --   --   AST 38 27  --   --   ALT 22 19  --   --   ALKPHOS 222* 171*  --   --   BILITOT 1.6* 1.5*  --   --   GFRNONAA 41* 45*  --  >60  ANIONGAP 15 15  --  7    Lipids No results for input(s): "CHOL", "TRIG", "HDL", "LABVLDL", "LDLCALC", "CHOLHDL" in the last 168 hours.  Hematology Recent Labs  Lab 05/15/23 1637 05/17/23 0214  WBC 9.9 9.5  RBC 5.41 4.55  HGB 15.8 13.2  HCT 47.8 39.3  MCV 88.4 86.4  MCH 29.2 29.0  MCHC 33.1 33.6  RDW 14.1 14.5  PLT 120* 115*   Thyroid  Recent Labs  Lab 05/15/23 1637 05/16/23 0200  TSH 12.847*  --   FREET4  --  0.86    BNPNo results for input(s): "BNP", "PROBNP" in the last 168 hours.  DDimer No results for input(s): "DDIMER" in the last 168 hours.   Radiology    CT PE study and abdomen/pelvis: 05/15/2023 1. No evidence of acute pulmonary embolism or other acute vascular findings in the chest. 2. Mildly heterogeneous enhancement in the lower pole of the left kidney, suspicious for pyelonephritis. No evidence of focal perinephric fluid collection or hydronephrosis. 3. Nonobstructing calculus in the lower pole infundibulum of the left kidney. 4. Marked enlargement of the prostate gland protruding into the bladder base. Mild bladder wall thickening and  trace air in the urinary bladder lumen, possibly iatrogenic. 5. Cholelithiasis without evidence of cholecystitis or biliary dilatation. 6.  Aortic Atherosclerosis (ICD10-I70.0).   Cardiac Studies   Echocardiogram 05/16/2023:  1. Left ventricular ejection fraction, by estimation, is 60 to 65%. Left  ventricular ejection fraction by PLAX is 61 %. The left ventricle has  normal function. The left ventricle has no regional wall motion  abnormalities. Left ventricular diastolic  parameters are consistent with Grade I diastolic dysfunction (impaired  relaxation).   2. Right ventricular systolic function is normal. The right ventricular  size is normal. The estimated right ventricular systolic pressure is 20.0  mmHg.   3. The mitral valve is grossly normal. Trivial mitral valve  regurgitation.   4. The aortic valve is tricuspid. Aortic valve regurgitation is not  visualized.   5. The inferior vena cava is dilated in size with >50% respiratory  variability, suggesting right atrial pressure of 8 mmHg.   Patient Profile     83 y.o. male history of Hashimoto thyroiditis, BPH, diabetes mellitus type 2, advanced age.  Assessment & Plan    Elevated troponins-likely demand ischemia. Has denied anginal chest pain during his hospitalization. Troponin is downtrending. 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 Elmarie Shiley [place]/ APP to reevaluate symptoms and assess if additional workup is warranted once more medically  stable.  Sepsis. Presented with fevers/chills/tachycardia Workup noted acute left-sided pyelonephritis. Currently on antibiotics Deferred to primary team.  Acute kidney injury: Now resolved  Thrombocytopenia Morning platelets 115,000 Likely secondary to underlying infection Defer management to primary team  Hashimoto's thyroiditis, history of Chronic. Management per primary team.  Diabetes mellitus type 2 Currently on insulin sliding scale. Reemphasized importance of glycemic control.  BPH On current medical therapy   [facility] will sign off.   Medication Recommendations: Sublingual nitroglycerin tablets to use on as needed basis Other recommendations (labs, testing,  etc): N/A Follow up as an outpatient: Follow-up with outpatient cardiology in 4-6 weeks for reevaluation.  For questions or updates, please contact [facility] Please consult [email or web address] for contact info under        Signed, Tessa Lerner, DO, [facility]  [facility]  [address] [place], [place] [address] Pager: [phone] Office: [phone] 05/17/2023, 9:11 AM

## 2023-05-20 ENCOUNTER — Ambulatory Visit

## 2023-05-20 DIAGNOSIS — R1032 Left lower quadrant pain: Secondary | ICD-10-CM | POA: Diagnosis not present

## 2023-05-20 DIAGNOSIS — N179 Acute kidney failure, unspecified: Secondary | ICD-10-CM | POA: Diagnosis not present

## 2023-05-20 DIAGNOSIS — N12 Tubulo-interstitial nephritis, not specified as acute or chronic: Secondary | ICD-10-CM | POA: Diagnosis not present

## 2023-05-20 DIAGNOSIS — K5901 Slow transit constipation: Secondary | ICD-10-CM | POA: Diagnosis not present

## 2023-05-20 LAB — CULTURE, BLOOD (ROUTINE X 2)
Culture: NO GROWTH
Culture: NO GROWTH

## 2023-05-23 DIAGNOSIS — R825 Elevated urine levels of drugs, medicaments and biological substances: Secondary | ICD-10-CM | POA: Diagnosis not present

## 2023-05-23 DIAGNOSIS — R3912 Poor urinary stream: Secondary | ICD-10-CM | POA: Diagnosis not present

## 2023-05-23 DIAGNOSIS — N401 Enlarged prostate with lower urinary tract symptoms: Secondary | ICD-10-CM | POA: Diagnosis not present

## 2023-05-28 ENCOUNTER — Other Ambulatory Visit: Payer: Self-pay

## 2023-05-28 ENCOUNTER — Inpatient Hospital Stay (HOSPITAL_BASED_OUTPATIENT_CLINIC_OR_DEPARTMENT_OTHER)
Admission: EM | Admit: 2023-05-28 | Discharge: 2023-05-31 | DRG: 853 | Disposition: A | Discharge status: Home / Self Care | Attending: Internal Medicine | Admitting: Internal Medicine

## 2023-05-28 ENCOUNTER — Emergency Department (HOSPITAL_BASED_OUTPATIENT_CLINIC_OR_DEPARTMENT_OTHER): Admitting: Radiology

## 2023-05-28 ENCOUNTER — Emergency Department (HOSPITAL_BASED_OUTPATIENT_CLINIC_OR_DEPARTMENT_OTHER)

## 2023-05-28 ENCOUNTER — Encounter (HOSPITAL_BASED_OUTPATIENT_CLINIC_OR_DEPARTMENT_OTHER): Payer: Self-pay

## 2023-05-28 DIAGNOSIS — Z885 Allergy status to narcotic agent status: Secondary | ICD-10-CM

## 2023-05-28 DIAGNOSIS — Z1611 Resistance to penicillins: Secondary | ICD-10-CM | POA: Diagnosis present

## 2023-05-28 DIAGNOSIS — R6521 Severe sepsis with septic shock: Secondary | ICD-10-CM | POA: Diagnosis not present

## 2023-05-28 DIAGNOSIS — N3001 Acute cystitis with hematuria: Secondary | ICD-10-CM | POA: Diagnosis not present

## 2023-05-28 DIAGNOSIS — Z043 Encounter for examination and observation following other accident: Secondary | ICD-10-CM | POA: Diagnosis not present

## 2023-05-28 DIAGNOSIS — R531 Weakness: Secondary | ICD-10-CM | POA: Diagnosis not present

## 2023-05-28 DIAGNOSIS — N401 Enlarged prostate with lower urinary tract symptoms: Secondary | ICD-10-CM | POA: Diagnosis present

## 2023-05-28 DIAGNOSIS — N390 Urinary tract infection, site not specified: Secondary | ICD-10-CM | POA: Diagnosis not present

## 2023-05-28 DIAGNOSIS — N200 Calculus of kidney: Secondary | ICD-10-CM | POA: Diagnosis not present

## 2023-05-28 DIAGNOSIS — N138 Other obstructive and reflux uropathy: Secondary | ICD-10-CM | POA: Diagnosis present

## 2023-05-28 DIAGNOSIS — A4159 Other Gram-negative sepsis: Secondary | ICD-10-CM | POA: Diagnosis not present

## 2023-05-28 DIAGNOSIS — N201 Calculus of ureter: Secondary | ICD-10-CM | POA: Diagnosis not present

## 2023-05-28 DIAGNOSIS — K5900 Constipation, unspecified: Secondary | ICD-10-CM | POA: Diagnosis not present

## 2023-05-28 DIAGNOSIS — E079 Disorder of thyroid, unspecified: Secondary | ICD-10-CM | POA: Diagnosis present

## 2023-05-28 DIAGNOSIS — Z7984 Long term (current) use of oral hypoglycemic drugs: Secondary | ICD-10-CM

## 2023-05-28 DIAGNOSIS — E860 Dehydration: Secondary | ICD-10-CM | POA: Diagnosis present

## 2023-05-28 DIAGNOSIS — K5730 Diverticulosis of large intestine without perforation or abscess without bleeding: Secondary | ICD-10-CM | POA: Diagnosis not present

## 2023-05-28 DIAGNOSIS — N132 Hydronephrosis with renal and ureteral calculous obstruction: Secondary | ICD-10-CM | POA: Diagnosis not present

## 2023-05-28 DIAGNOSIS — S022XXA Fracture of nasal bones, initial encounter for closed fracture: Secondary | ICD-10-CM | POA: Diagnosis not present

## 2023-05-28 DIAGNOSIS — N179 Acute kidney failure, unspecified: Secondary | ICD-10-CM | POA: Diagnosis not present

## 2023-05-28 DIAGNOSIS — E785 Hyperlipidemia, unspecified: Secondary | ICD-10-CM | POA: Diagnosis present

## 2023-05-28 DIAGNOSIS — A419 Sepsis, unspecified organism: Principal | ICD-10-CM | POA: Diagnosis present

## 2023-05-28 DIAGNOSIS — M4802 Spinal stenosis, cervical region: Secondary | ICD-10-CM | POA: Diagnosis not present

## 2023-05-28 DIAGNOSIS — M5030 Other cervical disc degeneration, unspecified cervical region: Secondary | ICD-10-CM | POA: Diagnosis not present

## 2023-05-28 DIAGNOSIS — B961 Klebsiella pneumoniae [K. pneumoniae] as the cause of diseases classified elsewhere: Secondary | ICD-10-CM | POA: Diagnosis not present

## 2023-05-28 DIAGNOSIS — Z91012 Allergy to eggs: Secondary | ICD-10-CM

## 2023-05-28 DIAGNOSIS — N136 Pyonephrosis: Secondary | ICD-10-CM | POA: Diagnosis present

## 2023-05-28 DIAGNOSIS — E1165 Type 2 diabetes mellitus with hyperglycemia: Secondary | ICD-10-CM | POA: Diagnosis present

## 2023-05-28 DIAGNOSIS — I700 Atherosclerosis of aorta: Secondary | ICD-10-CM | POA: Diagnosis not present

## 2023-05-28 DIAGNOSIS — E119 Type 2 diabetes mellitus without complications: Secondary | ICD-10-CM | POA: Diagnosis not present

## 2023-05-28 DIAGNOSIS — R6520 Severe sepsis without septic shock: Secondary | ICD-10-CM | POA: Diagnosis not present

## 2023-05-28 DIAGNOSIS — J342 Deviated nasal septum: Secondary | ICD-10-CM | POA: Diagnosis not present

## 2023-05-28 DIAGNOSIS — Z888 Allergy status to other drugs, medicaments and biological substances status: Secondary | ICD-10-CM

## 2023-05-28 DIAGNOSIS — I5032 Chronic diastolic (congestive) heart failure: Secondary | ICD-10-CM | POA: Diagnosis present

## 2023-05-28 DIAGNOSIS — Z1152 Encounter for screening for COVID-19: Secondary | ICD-10-CM

## 2023-05-28 DIAGNOSIS — E872 Acidosis, unspecified: Secondary | ICD-10-CM | POA: Diagnosis present

## 2023-05-28 DIAGNOSIS — A411 Sepsis due to other specified staphylococcus: Secondary | ICD-10-CM | POA: Diagnosis not present

## 2023-05-28 DIAGNOSIS — W19XXXA Unspecified fall, initial encounter: Secondary | ICD-10-CM | POA: Diagnosis present

## 2023-05-28 DIAGNOSIS — Z79899 Other long term (current) drug therapy: Secondary | ICD-10-CM

## 2023-05-28 LAB — URINALYSIS, W/ REFLEX TO CULTURE (INFECTION SUSPECTED)
Bilirubin Urine: NEGATIVE
Glucose, UA: NEGATIVE mg/dL
Ketones, ur: 15 mg/dL — AB
Nitrite: NEGATIVE
Protein, ur: 100 mg/dL — AB
RBC / HPF: >50 RBC/hpf (ref 0–5)
Specific Gravity, Urine: 1.025 (ref 1.005–1.030)
WBC, UA: >50 WBC/hpf (ref 0–5)
pH: 5.5 (ref 5.0–8.0)

## 2023-05-28 LAB — APTT: aPTT: 35 s (ref 24–36)

## 2023-05-28 LAB — PROTIME-INR
INR: 1.2 (ref 0.8–1.2)
Prothrombin Time: 15 s (ref 11.4–15.2)

## 2023-05-28 LAB — COMPREHENSIVE METABOLIC PANEL WITH GFR
ALT: 27 U/L (ref 0–44)
AST: 45 U/L — ABNORMAL HIGH (ref 15–41)
Albumin: 3.2 g/dL — ABNORMAL LOW (ref 3.5–5.0)
Alkaline Phosphatase: 205 U/L — ABNORMAL HIGH (ref 38–126)
Anion gap: 14 (ref 5–15)
BUN: 25 mg/dL — ABNORMAL HIGH (ref 8–23)
CO2: 21 mmol/L — ABNORMAL LOW (ref 22–32)
Calcium: 8.6 mg/dL — ABNORMAL LOW (ref 8.9–10.3)
Chloride: 98 mmol/L (ref 98–111)
Creatinine, Ser: 1.92 mg/dL — ABNORMAL HIGH (ref 0.61–1.24)
GFR, Estimated: 35 mL/min — ABNORMAL LOW (ref >60)
Glucose, Bld: 253 mg/dL — ABNORMAL HIGH (ref 70–99)
Potassium: 3.9 mmol/L (ref 3.5–5.1)
Sodium: 133 mmol/L — ABNORMAL LOW (ref 135–145)
Total Bilirubin: 1.2 mg/dL (ref 0.0–1.2)
Total Protein: 6.7 g/dL (ref 6.5–8.1)

## 2023-05-28 LAB — CBC WITH DIFFERENTIAL/PLATELET
Abs Immature Granulocytes: 2.77 10*3/uL — ABNORMAL HIGH (ref 0.00–0.07)
Basophils Absolute: 0.1 10*3/uL (ref 0.0–0.1)
Basophils Relative: 0 %
Eosinophils Absolute: 0.2 10*3/uL (ref 0.0–0.5)
Eosinophils Relative: 1 %
HCT: 39.6 % (ref 39.0–52.0)
Hemoglobin: 13.7 g/dL (ref 13.0–17.0)
Immature Granulocytes: 8 %
Lymphocytes Relative: 2 %
Lymphs Abs: 0.8 10*3/uL (ref 0.7–4.0)
MCH: 29.1 pg (ref 26.0–34.0)
MCHC: 34.6 g/dL (ref 30.0–36.0)
MCV: 84.3 fL (ref 80.0–100.0)
Monocytes Absolute: 1.7 10*3/uL — ABNORMAL HIGH (ref 0.1–1.0)
Monocytes Relative: 5 %
Neutro Abs: 28.9 10*3/uL — ABNORMAL HIGH (ref 1.7–7.7)
Neutrophils Relative %: 84 %
Platelets: 163 10*3/uL (ref 150–400)
RBC: 4.7 MIL/uL (ref 4.22–5.81)
RDW: 13.8 % (ref 11.5–15.5)
WBC: 34.4 10*3/uL — ABNORMAL HIGH (ref 4.0–10.5)
nRBC: 0 % (ref 0.0–0.2)

## 2023-05-28 LAB — TSH: TSH: 9.92 u[IU]/mL — ABNORMAL HIGH (ref 0.350–4.500)

## 2023-05-28 LAB — LIPASE, BLOOD: Lipase: 10 U/L — ABNORMAL LOW (ref 11–51)

## 2023-05-28 LAB — RESP PANEL BY RT-PCR (RSV, FLU A&B, COVID)  RVPGX2
Influenza A by PCR: NEGATIVE
Influenza B by PCR: NEGATIVE
Resp Syncytial Virus by PCR: NEGATIVE
SARS Coronavirus 2 by RT PCR: NEGATIVE

## 2023-05-28 LAB — LACTIC ACID, PLASMA
Lactic Acid, Venous: 2.9 mmol/L (ref 0.5–1.9)
Lactic Acid, Venous: 2 mmol/L (ref 0.5–1.9)

## 2023-05-28 MED ORDER — PIPERACILLIN-TAZOBACTAM 3.375 G IVPB
3.3750 g | Freq: Three times a day (TID) | INTRAVENOUS | Status: DC
  Filled 2023-05-28: qty 50

## 2023-05-28 MED ORDER — IOHEXOL 300 MG/ML  SOLN
100.0000 mL | Freq: Once | INTRAMUSCULAR | Status: AC | PRN
  Administered 2023-05-28: 80 mL via INTRAVENOUS

## 2023-05-28 MED ORDER — LACTATED RINGERS IV BOLUS (SEPSIS)
1000.0000 mL | Freq: Once | INTRAVENOUS | Status: AC
  Administered 2023-05-28: 1000 mL via INTRAVENOUS

## 2023-05-28 MED ORDER — LACTATED RINGERS IV SOLN: INTRAVENOUS | Status: AC

## 2023-05-28 MED ORDER — LACTATED RINGERS IV BOLUS (SEPSIS)
1000.0000 mL | Freq: Once | INTRAVENOUS | Status: AC
Start: 2023-05-28 — End: 2023-05-28
  Administered 2023-05-28: 1000 mL via INTRAVENOUS

## 2023-05-28 MED ORDER — METRONIDAZOLE 500 MG/100ML IV SOLN: 500.0000 mg | Freq: Once | INTRAVENOUS | Status: DC

## 2023-05-28 MED ORDER — VANCOMYCIN HCL IN DEXTROSE 1-5 GM/200ML-% IV SOLN: 1000.0000 mg | Freq: Once | INTRAVENOUS | Status: DC

## 2023-05-28 MED ORDER — CEFEPIME HCL 2 G IV SOLR: 2.0000 g | Freq: Once | INTRAVENOUS | Status: DC

## 2023-05-28 MED ORDER — PIPERACILLIN-TAZOBACTAM 3.375 G IVPB 30 MIN
3.3750 g | Freq: Once | INTRAVENOUS | Status: AC
  Administered 2023-05-28: 3.375 g via INTRAVENOUS
  Filled 2023-05-28: qty 50

## 2023-05-28 NOTE — Progress Notes (Signed)
 Plan of Care Note for accepted transfer   Patient: Charles Strong MRN: [id number]   DOA: 05/28/2023  Facility requesting transfer: [facility]   Requesting Provider: Evlyn Kanner, PA   Reason for transfer: Obstructing ureteral stone   Facility course: 82 yr old man with T2DM, BPH, and recent admission for pyelonephritis who presents with weakness and falls and is found to have AKI, WBC 34,4, and lactate 2.9. CT demonstrates distal left ureteral stone with mild hydro.   Urology (Dr. Pete Glatter) recommended medical admission to [facility]. Culture were collected and the patient was treated with IVF and Zosyn.   Plan of care: The patient is accepted for admission to Progressive unit, at [facility].   Author: Briscoe Deutscher, MD 05/28/2023  Check [email or web address] for on-call coverage.  Nursing staff, Please call [facility] Admits & Consults System-Wide number on Amion as soon as patient's arrival, so appropriate admitting provider can evaluate the pt.

## 2023-05-28 NOTE — ED Triage Notes (Signed)
 Pt POV from home w/ wife and their son c/o ongoing weakness since falling on Sunday. Concern for LOC leading to fall. Pt recent admitted and tx for sepsis.

## 2023-05-28 NOTE — ED Provider Notes (Signed)
 [place] [facility] AT [facility] Provider Note   CSN: [id number] Arrival date & time: 05/28/23  1723     History  Chief Complaint  Patient presents with   Weakness    Charles Strong is a 83 y.o. male history of AKI, hyperlipidemia, Hashimoto's thyroiditis, thrombocytopenia, diabetes presented for dehydration and weakness.  Patient was recently seen and admitted for AKI along with suspected pyelonephritis.  Patient was discharged on 05/17/2023 but states that has not been doing well since discharge and that he feels dehydrated.  Patient does endorse temperatures of 100 F but denies chest pain or shortness of breath.  Patient states he has abdominal pain that comes and goes but cannot further describe this pain.  Last Sunday patient said he was so dehydrated that he passed out and hit his face.  Wife states that he was on the ground roughly 15 minutes and needed help getting up.  Patient denies any head or neck pain or being on any blood thinners.  Patient has any paresthesias chest pain or shortness of breath.  Patient is unsure if he is still having dysuria but otherwise denies urinary symptoms.  Patient was discharged on cefadroxil and states that he completed the medications.    Home Medications Prior to Admission medications   Medication Sig Start Date End Date Taking? Authorizing Provider  acetaminophen (TYLENOL) 500 MG tablet Take 500 mg by mouth every 6 (six) hours as needed for mild pain (pain score 1-3), fever or headache.    [provider]  APPLE CIDER VINEGAR PO Take 30 mLs by mouth daily.     [provider]  Barberry-Oreg Grape-Goldenseal (BERBERINE COMPLEX PO) Take 2 capsules by mouth 2 (two) times daily.    [provider]  finasteride (PROSCAR) 5 MG tablet Take 5 mg by mouth daily. 11/20/17   [provider]  fluticasone (FLONASE) 50 MCG/ACT nasal spray PLACE 2 SPRAYS IN EACH NOSTRIL DAILY AS NEEDED FOR ALLERGIES  11/07/17   Chilton Si, MD  glipiZIDE (GLUCOTROL XL) 5 MG 24 hr tablet Take 5 mg by mouth daily. 12/06/19   [provider]  glucose blood test strip 1 each by Other route 3 (three) times daily. Use as instructed    [provider]  metFORMIN (GLUCOPHAGE-XR) 500 MG 24 hr tablet Take 500 mg by mouth daily with breakfast.    [provider]  NITROSTAT 0.4 MG SL tablet PLACE 1 TABLET UNDER THE TONGUE EVERY 5 MINUTES AS NEEDED FOR CHEST PAIN 01/22/16   Chilton Si, MD  Potassium 99 MG TABS Take 99 mg by mouth daily.    [provider]  Selenium 200 MCG TABS Take 200 mcg by mouth daily.    [provider]  silodosin (RAPAFLO) 8 MG CAPS capsule Take 8 mg by mouth daily with breakfast. 11/20/17   [provider]  thiamine (VITAMIN B-1) 100 MG tablet Take 100 mg by mouth daily.    [provider]  TURMERIC PO Take 400 mg by mouth daily.    [provider]  vitamin B-12 (CYANOCOBALAMIN) 100 MCG tablet Take 100 mcg by mouth daily.    [provider]      Allergies    Calcium fortified cookie [nutritional supplements], Egg-derived products, and Metformin and related    Review of Systems   Review of Systems  Neurological:  Positive for weakness.    Physical Exam Updated Vital Signs BP (!) 93/52   Pulse (!) 106  Temp 97.6 F (36.4 C) (Oral)   Resp (!) 25   Ht 5\' 10"  (1.778 m)   Wt 63.5 kg   SpO2 94%   BMI 20.09 kg/m  Physical Exam Vitals reviewed.  Constitutional:      General: He is in acute distress.     Appearance: He is ill-appearing.  HENT:     Head: Normocephalic.     Comments: Ecchymosis over nose No tenderness to palpation No septal hematoma    Mouth/Throat:     Mouth: Mucous membranes are dry.  Eyes:     Extraocular Movements: Extraocular movements intact.     Conjunctiva/sclera: Conjunctivae normal.     Pupils: Pupils are equal, round, and reactive to light.  Cardiovascular:      Rate and Rhythm: Normal rate and regular rhythm.     Pulses: Normal pulses.     Heart sounds: Normal heart sounds.     Comments: 2+ bilateral radial/dorsalis pedis pulses with regular rate Pulmonary:     Effort: Pulmonary effort is normal. No respiratory distress.     Breath sounds: Normal breath sounds.  Abdominal:     Palpations: Abdomen is soft.     Tenderness: There is no abdominal tenderness. There is no guarding or rebound.  Musculoskeletal:        General: Normal range of motion.     Cervical back: Normal range of motion and neck supple.     Comments: 5 out of 5 bilateral grip/leg extension strength Negative logroll bilaterally  Skin:    General: Skin is warm and dry.     Capillary Refill: Capillary refill takes less than 2 seconds.  Neurological:     General: No focal deficit present.     Mental Status: He is alert and oriented to person, place, and time.     Comments: Sensation intact in all 4 limbs  Psychiatric:        Mood and Affect: Mood normal.     ED Results / Procedures / Treatments   Labs (all labs ordered are listed, but only abnormal results are displayed) Labs Reviewed  COMPREHENSIVE METABOLIC PANEL WITH GFR - Abnormal; Notable for the following components:      Result Value   Sodium 133 (*)    CO2 21 (*)    Glucose, Bld 253 (*)    BUN 25 (*)    Creatinine, Ser 1.92 (*)    Calcium 8.6 (*)    Albumin 3.2 (*)    AST 45 (*)    Alkaline Phosphatase 205 (*)    GFR, Estimated 35 (*)    All other components within normal limits  LACTIC ACID, PLASMA - Abnormal; Notable for the following components:   Lactic Acid, Venous 2.9 (*)    All other components within normal limits  CBC WITH DIFFERENTIAL/PLATELET - Abnormal; Notable for the following components:   WBC 34.4 (*)    Neutro Abs 28.9 (*)    Monocytes Absolute 1.7 (*)    Abs Immature Granulocytes 2.77 (*)    All other components within normal limits  URINALYSIS, W/ REFLEX TO CULTURE (INFECTION  SUSPECTED) - Abnormal; Notable for the following components:   Color, Urine ORANGE (*)    APPearance CLOUDY (*)    Hgb urine dipstick LARGE (*)    Ketones, ur 15 (*)    Protein, ur 100 (*)    Leukocytes,Ua LARGE (*)    Bacteria, UA MANY (*)    All other components within normal limits  TSH - Abnormal; Notable for the following components:   TSH 9.920 (*)    All other components within normal limits  LIPASE, BLOOD - Abnormal; Notable for the following components:   Lipase 10 (*)    All other components within normal limits  RESP PANEL BY RT-PCR (RSV, FLU A&B, COVID)  RVPGX2  CULTURE, BLOOD (ROUTINE X 2)  CULTURE, BLOOD (ROUTINE X 2)  URINE CULTURE  PROTIME-INR  APTT  LACTIC ACID, PLASMA    EKG EKG Interpretation Date/Time:  Wednesday May 28 2023 17:34:37 EDT Ventricular Rate:  135 PR Interval:  132 QRS Duration:  86 QT Interval:  299 QTC Calculation: 449 R Axis:   -69  Text Interpretation: Sinus tachycardia Left anterior fascicular block Confirmed by Alvester Chou [phone]) on 05/28/2023 6:30:21 PM  Radiology CT ABDOMEN PELVIS W CONTRAST Result Date: 05/28/2023 CLINICAL DATA:  Sepsis Recent pyelonephritis.  Fall. EXAM: CT ABDOMEN AND PELVIS WITH CONTRAST TECHNIQUE: Multidetector CT imaging of the abdomen and pelvis was performed using the standard protocol following bolus administration of intravenous contrast. RADIATION DOSE REDUCTION: This exam was performed according to the departmental dose-optimization program which includes automated exposure control, adjustment of the mA and/or kV according to patient size and/or use of iterative reconstruction technique. CONTRAST:  80mL OMNIPAQUE IOHEXOL 300 MG/ML  SOLN COMPARISON:  05/15/2023 FINDINGS: Lower chest: No acute abnormality Hepatobiliary: Small 1-2 mm layering gallstone, stable. No wall thickening or biliary ductal dilatation. No focal hepatic abnormality. Pancreas: No focal abnormality or ductal dilatation. Spleen: No focal  abnormality.  Normal size. Adrenals/Urinary Tract: Adrenal glands normal. Previously seen left lower pole renal stone has migrated and is now in the distal left ureter measuring 8 mm. Mild left hydronephrosis. No definite current signs of pyelonephritis. No stones or hydronephrosis on the right. Urinary bladder appears thick walled, partially decompressed. Stomach/Bowel: Sigmoid diverticulosis. No active diverticulitis. Stomach and small bowel decompressed, unremarkable. Vascular/Lymphatic: Aortic atherosclerosis. No evidence of aneurysm or adenopathy. Reproductive: Prostate enlargement. Other: No free fluid or free air. Musculoskeletal: No acute bony abnormality. IMPRESSION: Migration of the previously seen left lower pole renal stone into the left ureter. 8 mm stone in the distal left ureter of mild left hydronephrosis. Sigmoid diverticulosis. Aortic atherosclerosis. Prostate enlargement. Bladder wall thickening may be related to chronic bladder outlet obstruction. Electronically Signed   By: Charlett Nose M.D.   On: 05/28/2023 19:56   CT Maxillofacial WO CM Result Date: 05/28/2023 CLINICAL DATA:  Bruising around nose from fall. EXAM: CT MAXILLOFACIAL WITHOUT CONTRAST TECHNIQUE: Multidetector CT imaging of the maxillofacial structures was performed. Multiplanar CT image reconstructions were also generated. RADIATION DOSE REDUCTION: This exam was performed according to the departmental dose-optimization program which includes automated exposure control, adjustment of the mA and/or kV according to patient size and/or use of iterative reconstruction technique. COMPARISON:  None Available. FINDINGS: Osseous: Fractures noted through the nasal bones, minimally displaced. 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 Mandible and zygomatic arches intact. Orbits: No fracture or orbital emphysema.  Globes intact. Sinuses: Clear Soft tissues: Negative Limited intracranial: See head CT  report IMPRESSION: Minimally displaced nasal bone fractures. Electronically Signed   By: Charlett Nose M.D.   On: 05/28/2023 19:52   CT Cervical Spine Wo Contrast Result Date: 05/28/2023 CLINICAL DATA:  Fall EXAM: CT CERVICAL SPINE WITHOUT CONTRAST TECHNIQUE: Multidetector CT imaging of the cervical spine was performed without intravenous contrast. Multiplanar CT image reconstructions were also generated. RADIATION DOSE REDUCTION:  This exam was performed according to the departmental dose-optimization program which includes automated exposure control, adjustment of the mA and/or kV according to patient size and/or use of iterative reconstruction technique. COMPARISON:  None Available. FINDINGS: Alignment: Normal. Skull base and vertebrae: No acute fracture. No primary bone lesion or focal pathologic process. Soft tissues and spinal canal: No prevertebral fluid or swelling. No visible canal hematoma. Disc levels: Diffuse mild degenerative disc disease with disc space narrowing and spurring. Mild bilateral degenerative facet disease, left greater than right. Upper chest: No acute findings. Calcified granuloma in the left upper lobe. Other: None IMPRESSION: Degenerative disc and facet disease.  No acute bony abnormality. Electronically Signed   By: Charlett Nose M.D.   On: 05/28/2023 19:50   CT Head Wo Contrast Result Date: 05/28/2023 CLINICAL DATA:  Fall EXAM: CT HEAD WITHOUT CONTRAST TECHNIQUE: Contiguous axial images were obtained from the base of the skull through the vertex without intravenous contrast. RADIATION DOSE REDUCTION: This exam was performed according to the departmental dose-optimization program which includes automated exposure control, adjustment of the mA and/or kV according to patient size and/or use of iterative reconstruction technique. COMPARISON:  None Available. FINDINGS: Brain: No acute intracranial abnormality. Specifically, no hemorrhage, hydrocephalus, mass lesion, acute infarction, or  significant intracranial injury. Vascular: No hyperdense vessel or unexpected calcification. Skull: No acute calvarial abnormality. Sinuses/Orbits: No acute findings Other: None IMPRESSION: No acute intracranial abnormality. Electronically Signed   By: Charlett Nose M.D.   On: 05/28/2023 19:48   DG Chest 1 View Result Date: 05/28/2023 CLINICAL DATA:  Weakness and sepsis. EXAM: CHEST  1 VIEW COMPARISON:  05/15/2023 FINDINGS: Stable cardiomediastinal silhouette. Aortic atherosclerotic calcification. No focal consolidation, pleural effusion, or pneumothorax. No displaced rib fractures. IMPRESSION: No acute cardiopulmonary disease. Electronically Signed   By: Minerva Fester M.D.   On: 05/28/2023 19:42    Procedures .Critical Care  Performed by: Netta Corrigan, PA-C Authorized by: Netta Corrigan, PA-C   Critical care provider statement:    Critical care time (minutes):  50   Critical care time was exclusive of:  Separately billable procedures and treating other patients   Critical care was necessary to treat or prevent imminent or life-threatening deterioration of the following conditions:  Sepsis   Critical care was time spent personally by me on the following activities:  Blood draw for specimens, development of treatment plan with patient or surrogate, discussions with consultants, evaluation of patient's response to treatment, examination of patient, obtaining history from patient or surrogate, re-evaluation of patient's condition, pulse oximetry, review of old charts, ordering and review of radiographic studies, ordering and review of laboratory studies and ordering and performing treatments and interventions   I assumed direction of critical care for this patient from another provider in my specialty: no     Care discussed with: admitting provider       Medications Ordered in ED Medications  lactated ringers infusion (has no administration in time range)  piperacillin-tazobactam (ZOSYN)  IVPB 3.375 g (0 g Intravenous Stopped 05/28/23 1908)    Followed by  piperacillin-tazobactam (ZOSYN) IVPB 3.375 g (has no administration in time range)  lactated ringers bolus 1,000 mL (1,000 mLs Intravenous New Bag/Given 05/28/23 1754)    And  lactated ringers bolus 1,000 mL (1,000 mLs Intravenous New Bag/Given 05/28/23 1801)  iohexol (OMNIPAQUE) 300 MG/ML solution 100 mL (80 mLs Intravenous Contrast Given 05/28/23 1857)    ED Course/ Medical Decision Making/ A&P Clinical Course as of 05/28/23 2009  Wed May 28, 2023  5621 83 year old male with recent hospitalization 2 weeks ago for pyelonephritis presenting from home with generalized weakness, fall, head injury.  On presentation patient is afebrile but tachycardic with borderline hypotension, mildly tachypneic, no hypoxia.  He has contusions involving the front of the face.  No abdominal tenderness.  Labs notable for elevated lactate of 2.9 and white blood cell count elevation at 34,000. [MT]  1850 He also appears of a prerenal AKI with elevation of his creatinine and BUN.  We are awaiting UA sample but he is being managed per sepsis protocol with fluid bolus for hypotension and lactate elevation as well as broad-spectrum antibiotic with IV Zosyn.  Trauma imaging of the head and face were ordered as well as CT chest abdomen pelvis for infectious source evaluation. [MT]  2001 CT imaging with 8 mm distal left ureteral stone.  PA provider to contact urology, plan to admit for urosepsis [MT]    Clinical Course User Index [MT] Trifan, Kermit Balo, MD                                 Medical Decision Making Amount and/or Complexity of Data Reviewed Labs: ordered. Radiology: ordered.  Risk Prescription drug management.   [name] 83 y.o. presented today for sepsis.  Working DDx that I considered at this time includes, but not limited to, sepsis, bacteremia, UTI, pneumonia, meningitis/encephalitis, cellulitis, ACS, myocarditis, acidosis,  dehydration, electrolyte abnormalities.  R/o DDx: bacteremia, pneumonia, meningitis/encephalitis, cellulitis, ACS, myocarditis, acidosis, electrolyte abnormalities: These are considered less likely due to history of present illness, physical exam, labs/imaging findings.  Review of prior external notes: 05/15/2023 unknown  Unique Tests and My Independent Interpretation: CBC: Leukocytosis 34.4 CMP: Creatinine 1.92, GFR 35 Lactic acid: 2.9 Lipase: Unremarkable UA: Pyuria noted Chest x-ray: No acute pathology CT head without contrast:Unremarkable CT maxillofacial: Minimally displaced nasal fracture CT cervical spine without contrast: Unremarkable CT abdomen pelvis with contrast: 8 mm stone noted with mild hydronephrosis on the left side aPTT: Unremarkable PT/INR: Unremarkable Blood cultures: Pending Respiratory panel: Negative EKG: Sinus tachycardia 135 bpm, no ST elevations or signs of right heart strain TSH: Elevated 9.9 Urine culture: Pending  Social Determinants of Health: none  Discussion with Independent Historian:  Wife  Discussion of Management of Tests:  Stoneking, MD Urology ; Opyd, MD Hospitalist  Risk: High: Hospitalization  Risk Stratification Score: none  Staffed with Trifan, MD  Plan: On exam patient was septic on arrival.  On exam patient tachycardic and hypotensive at 86. The cardiac monitor was ordered secondary to the patient's history of sepsis and to monitor the patient for dysrhythmia. Cardiac monitor by my independent interpretation showed sinus tachycardia.  Patient is exam does show ecchymosis around the nose area but otherwise normal neuroexam and no neck tenderness.  Patient did have a fall that was unwitnessed and so we will get head and neck imaging while maxillofacial.  Patient dates that all of his symptoms are from his dehydration and he does look clinically dehydrated and given his vitals we will do 30 mix per cake of fluids as I am concerned about  septic shock.  Patient was recently admitted for pyelonephritis and discharged on appropriate antibiotics however states he has not gotten better since then.  Patient was also endorsing vague abdominal pain and so given the fact he was on IV antibiotics recently hospitalized we will do IV Zosyn and obtain  abdominal imaging.  Patient did have CT scan of his chest for PE on 05/15/2023 when he presented to the ED those ultimately negative.  Patient is now endorsing chest pain or shortness of breath and lungs are clear at this time and so do not feel he needs repeat imaging of his chest.  Waiting on imaging but labs do show AKI along with lactic acid suggesting prerenal cause.  Patient is already receiving 30 mg/kg of fluids and will continue to monitor. Patient was reevaluated.  By my independent interpretation of the patient's cardiac monitor, the monitor shows normal sinus with tachycardia this come down to 110 along with blood pressure that is 98/58 and improving to fluids.  CT does show septic stone.  I spoke to urology and they state that patient will need to go to [facility] as he may need a stent later this evening.  Will consult hospitalist for admission.  I spoke to the hospitalist and patient was accepted for admission.  Patient stable for admission.  This chart was dictated using voice recognition software.  Despite best efforts to proofread,  errors can occur which can change the documentation meaning.        Final Clinical Impression(s) / ED Diagnoses Final diagnoses:  Sepsis with acute renal failure and septic shock, due to unspecified organism, unspecified acute renal failure type (HCC)  Nephrolithiasis  Acute cystitis with hematuria    Rx / DC Orders ED Discharge Orders     None         Remi Deter 05/28/23 2033    Terald Sleeper, MD 05/28/23 2046

## 2023-05-29 ENCOUNTER — Emergency Department (HOSPITAL_COMMUNITY): Admitting: Certified Registered Nurse Anesthetist

## 2023-05-29 ENCOUNTER — Emergency Department (HOSPITAL_COMMUNITY)

## 2023-05-29 ENCOUNTER — Encounter (HOSPITAL_BASED_OUTPATIENT_CLINIC_OR_DEPARTMENT_OTHER): Payer: Self-pay | Admitting: Family Medicine

## 2023-05-29 ENCOUNTER — Encounter (HOSPITAL_COMMUNITY)
Admission: EM | Disposition: A | Discharge status: Home / Self Care | Payer: Self-pay | Source: Home / Self Care | Attending: Internal Medicine

## 2023-05-29 DIAGNOSIS — N3001 Acute cystitis with hematuria: Secondary | ICD-10-CM | POA: Diagnosis not present

## 2023-05-29 DIAGNOSIS — E1165 Type 2 diabetes mellitus with hyperglycemia: Secondary | ICD-10-CM | POA: Diagnosis not present

## 2023-05-29 DIAGNOSIS — A419 Sepsis, unspecified organism: Secondary | ICD-10-CM | POA: Diagnosis not present

## 2023-05-29 DIAGNOSIS — N201 Calculus of ureter: Secondary | ICD-10-CM

## 2023-05-29 DIAGNOSIS — N200 Calculus of kidney: Secondary | ICD-10-CM | POA: Diagnosis present

## 2023-05-29 DIAGNOSIS — E119 Type 2 diabetes mellitus without complications: Secondary | ICD-10-CM | POA: Diagnosis not present

## 2023-05-29 DIAGNOSIS — A4159 Other Gram-negative sepsis: Secondary | ICD-10-CM | POA: Diagnosis not present

## 2023-05-29 DIAGNOSIS — R6520 Severe sepsis without septic shock: Secondary | ICD-10-CM | POA: Diagnosis not present

## 2023-05-29 DIAGNOSIS — N179 Acute kidney failure, unspecified: Secondary | ICD-10-CM | POA: Diagnosis not present

## 2023-05-29 DIAGNOSIS — Z885 Allergy status to narcotic agent status: Secondary | ICD-10-CM | POA: Diagnosis not present

## 2023-05-29 DIAGNOSIS — Z1152 Encounter for screening for COVID-19: Secondary | ICD-10-CM | POA: Diagnosis not present

## 2023-05-29 DIAGNOSIS — E039 Hypothyroidism, unspecified: Secondary | ICD-10-CM | POA: Diagnosis not present

## 2023-05-29 DIAGNOSIS — I5032 Chronic diastolic (congestive) heart failure: Secondary | ICD-10-CM | POA: Diagnosis not present

## 2023-05-29 DIAGNOSIS — E785 Hyperlipidemia, unspecified: Secondary | ICD-10-CM | POA: Diagnosis not present

## 2023-05-29 DIAGNOSIS — E872 Acidosis, unspecified: Secondary | ICD-10-CM | POA: Diagnosis not present

## 2023-05-29 DIAGNOSIS — W19XXXA Unspecified fall, initial encounter: Secondary | ICD-10-CM | POA: Diagnosis present

## 2023-05-29 DIAGNOSIS — N136 Pyonephrosis: Secondary | ICD-10-CM | POA: Diagnosis not present

## 2023-05-29 DIAGNOSIS — Z7984 Long term (current) use of oral hypoglycemic drugs: Secondary | ICD-10-CM | POA: Diagnosis not present

## 2023-05-29 DIAGNOSIS — K5900 Constipation, unspecified: Secondary | ICD-10-CM | POA: Diagnosis not present

## 2023-05-29 DIAGNOSIS — Z1611 Resistance to penicillins: Secondary | ICD-10-CM | POA: Diagnosis not present

## 2023-05-29 DIAGNOSIS — E860 Dehydration: Secondary | ICD-10-CM | POA: Diagnosis not present

## 2023-05-29 DIAGNOSIS — Z91012 Allergy to eggs: Secondary | ICD-10-CM | POA: Diagnosis not present

## 2023-05-29 DIAGNOSIS — Z888 Allergy status to other drugs, medicaments and biological substances status: Secondary | ICD-10-CM | POA: Diagnosis not present

## 2023-05-29 DIAGNOSIS — S022XXA Fracture of nasal bones, initial encounter for closed fracture: Secondary | ICD-10-CM | POA: Diagnosis not present

## 2023-05-29 DIAGNOSIS — R6521 Severe sepsis with septic shock: Secondary | ICD-10-CM

## 2023-05-29 DIAGNOSIS — N138 Other obstructive and reflux uropathy: Secondary | ICD-10-CM | POA: Diagnosis not present

## 2023-05-29 DIAGNOSIS — E079 Disorder of thyroid, unspecified: Secondary | ICD-10-CM | POA: Diagnosis not present

## 2023-05-29 DIAGNOSIS — N401 Enlarged prostate with lower urinary tract symptoms: Secondary | ICD-10-CM | POA: Diagnosis not present

## 2023-05-29 DIAGNOSIS — Z79899 Other long term (current) drug therapy: Secondary | ICD-10-CM | POA: Diagnosis not present

## 2023-05-29 HISTORY — PX: CYSTOSCOPY W/ URETERAL STENT PLACEMENT: SHX1429

## 2023-05-29 LAB — LACTIC ACID, PLASMA: Lactic Acid, Venous: 1.2 mmol/L (ref 0.5–1.9)

## 2023-05-29 LAB — BLOOD CULTURE ID PANEL (REFLEXED) - BCID2

## 2023-05-29 LAB — COMPREHENSIVE METABOLIC PANEL WITH GFR
ALT: 22 U/L (ref 0–44)
AST: 22 U/L (ref 15–41)
Albumin: 2.2 g/dL — ABNORMAL LOW (ref 3.5–5.0)
Alkaline Phosphatase: 118 U/L (ref 38–126)
Anion gap: 11 (ref 5–15)
BUN: 17 mg/dL (ref 8–23)
CO2: 21 mmol/L — ABNORMAL LOW (ref 22–32)
Calcium: 8 mg/dL — ABNORMAL LOW (ref 8.9–10.3)
Chloride: 105 mmol/L (ref 98–111)
Creatinine, Ser: 1.17 mg/dL (ref 0.61–1.24)
GFR, Estimated: >60 mL/min (ref >60)
Glucose, Bld: 203 mg/dL — ABNORMAL HIGH (ref 70–99)
Potassium: 4.3 mmol/L (ref 3.5–5.1)
Sodium: 137 mmol/L (ref 135–145)
Total Bilirubin: 0.9 mg/dL (ref 0.0–1.2)
Total Protein: 5.3 g/dL — ABNORMAL LOW (ref 6.5–8.1)

## 2023-05-29 LAB — CBC
HCT: 34.2 % — ABNORMAL LOW (ref 39.0–52.0)
Hemoglobin: 11.5 g/dL — ABNORMAL LOW (ref 13.0–17.0)
MCH: 28.8 pg (ref 26.0–34.0)
MCHC: 33.6 g/dL (ref 30.0–36.0)
MCV: 85.7 fL (ref 80.0–100.0)
Platelets: 158 10*3/uL (ref 150–400)
RBC: 3.99 MIL/uL — ABNORMAL LOW (ref 4.22–5.81)
RDW: 14.3 % (ref 11.5–15.5)
WBC: 31.7 10*3/uL — ABNORMAL HIGH (ref 4.0–10.5)
nRBC: 0 % (ref 0.0–0.2)

## 2023-05-29 LAB — GLUCOSE, CAPILLARY
Glucose-Capillary: 173 mg/dL — ABNORMAL HIGH (ref 70–99)
Glucose-Capillary: 284 mg/dL — ABNORMAL HIGH (ref 70–99)
Glucose-Capillary: 230 mg/dL — ABNORMAL HIGH (ref 70–99)
Glucose-Capillary: 300 mg/dL — ABNORMAL HIGH (ref 70–99)

## 2023-05-29 LAB — CBG MONITORING, ED: Glucose-Capillary: 174 mg/dL — ABNORMAL HIGH (ref 70–99)

## 2023-05-29 SURGERY — CYSTOSCOPY, WITH RETROGRADE PYELOGRAM AND URETERAL STENT INSERTION
Anesthesia: General | Laterality: Left

## 2023-05-29 MED ORDER — INSULIN ASPART 100 UNIT/ML IJ SOLN
0.0000 [IU] | Freq: Every day | INTRAMUSCULAR | Status: DC
  Administered 2023-05-29: 3 [IU] via SUBCUTANEOUS

## 2023-05-29 MED ORDER — BISACODYL 5 MG PO TBEC: 5.0000 mg | DELAYED_RELEASE_TABLET | Freq: Every day | ORAL | Status: DC | PRN

## 2023-05-29 MED ORDER — ACETAMINOPHEN 10 MG/ML IV SOLN: 1000.0000 mg | Freq: Once | INTRAVENOUS | Status: DC | PRN

## 2023-05-29 MED ORDER — PHENYLEPHRINE HCL (PRESSORS) 10 MG/ML IV SOLN
INTRAVENOUS | Status: DC | PRN
  Administered 2023-05-29: 80 ug via INTRAVENOUS
  Administered 2023-05-29: 160 ug via INTRAVENOUS
  Administered 2023-05-29: 80 ug via INTRAVENOUS
  Administered 2023-05-29 (×2): 160 ug via INTRAVENOUS

## 2023-05-29 MED ORDER — LIDOCAINE HCL (CARDIAC) PF 100 MG/5ML IV SOSY
PREFILLED_SYRINGE | INTRAVENOUS | Status: DC | PRN
Start: 2023-05-29 — End: 2023-05-29
  Administered 2023-05-29: 60 mg via INTRAVENOUS

## 2023-05-29 MED ORDER — INSULIN ASPART 100 UNIT/ML IJ SOLN
0.0000 [IU] | INTRAMUSCULAR | Status: DC
  Administered 2023-05-29: 2 [IU] via SUBCUTANEOUS
  Administered 2023-05-29: 5 [IU] via SUBCUTANEOUS

## 2023-05-29 MED ORDER — FENTANYL CITRATE (PF) 100 MCG/2ML IJ SOLN
INTRAMUSCULAR | Status: DC | PRN
  Administered 2023-05-29: 150 ug via INTRAVENOUS

## 2023-05-29 MED ORDER — ONDANSETRON HCL 4 MG/2ML IJ SOLN: 4.0000 mg | Freq: Once | INTRAMUSCULAR | Status: DC | PRN

## 2023-05-29 MED ORDER — FENTANYL CITRATE (PF) 250 MCG/5ML IJ SOLN
INTRAMUSCULAR | Status: AC
  Filled 2023-05-29: qty 5

## 2023-05-29 MED ORDER — IOHEXOL 300 MG/ML  SOLN
INTRAMUSCULAR | Status: DC | PRN
  Administered 2023-05-29: 8 mL

## 2023-05-29 MED ORDER — ACETAMINOPHEN 325 MG PO TABS: 650.0000 mg | ORAL_TABLET | Freq: Four times a day (QID) | ORAL | Status: DC | PRN

## 2023-05-29 MED ORDER — AMISULPRIDE (ANTIEMETIC) 5 MG/2ML IV SOLN: 10.0000 mg | Freq: Once | INTRAVENOUS | Status: DC | PRN

## 2023-05-29 MED ORDER — OXYCODONE HCL 5 MG PO TABS
5.0000 mg | ORAL_TABLET | Freq: Four times a day (QID) | ORAL | Status: DC | PRN
  Administered 2023-05-29: 5 mg via ORAL
  Filled 2023-05-29: qty 1

## 2023-05-29 MED ORDER — PIPERACILLIN-TAZOBACTAM 3.375 G IVPB
3.3750 g | Freq: Three times a day (TID) | INTRAVENOUS | Status: DC
  Administered 2023-05-29 (×2): 3.375 g via INTRAVENOUS
  Filled 2023-05-29 (×2): qty 50

## 2023-05-29 MED ORDER — ALBUMIN HUMAN 5 % IV SOLN: INTRAVENOUS | Status: DC | PRN

## 2023-05-29 MED ORDER — PROPOFOL 10 MG/ML IV BOLUS
INTRAVENOUS | Status: DC | PRN
Start: 2023-05-29 — End: 2023-05-29
  Administered 2023-05-29 (×2): 50 mg via INTRAVENOUS

## 2023-05-29 MED ORDER — ONDANSETRON HCL 4 MG/2ML IJ SOLN
INTRAMUSCULAR | Status: DC | PRN
  Administered 2023-05-29: 4 mg via INTRAVENOUS

## 2023-05-29 MED ORDER — MELATONIN 5 MG PO TABS
5.0000 mg | ORAL_TABLET | Freq: Every evening | ORAL | Status: DC | PRN
Start: 2023-05-29 — End: 2023-06-01

## 2023-05-29 MED ORDER — POLYETHYLENE GLYCOL 3350 17 G PO PACK
17.0000 g | PACK | Freq: Every day | ORAL | Status: DC
  Administered 2023-05-29 – 2023-05-31 (×3): 17 g via ORAL
  Filled 2023-05-29 (×4): qty 1

## 2023-05-29 MED ORDER — SODIUM CHLORIDE 0.9 % IR SOLN
Status: DC | PRN
  Administered 2023-05-29: 3000 mL

## 2023-05-29 MED ORDER — ONDANSETRON HCL 4 MG/2ML IJ SOLN
INTRAMUSCULAR | Status: AC
  Filled 2023-05-29: qty 2

## 2023-05-29 MED ORDER — SUCCINYLCHOLINE CHLORIDE 200 MG/10ML IV SOSY
PREFILLED_SYRINGE | INTRAVENOUS | Status: AC
  Filled 2023-05-29: qty 10

## 2023-05-29 MED ORDER — INSULIN ASPART 100 UNIT/ML IJ SOLN
0.0000 [IU] | Freq: Three times a day (TID) | INTRAMUSCULAR | Status: DC
  Administered 2023-05-29: 5 [IU] via SUBCUTANEOUS
  Administered 2023-05-30: 3 [IU] via SUBCUTANEOUS
  Administered 2023-05-30 (×2): 8 [IU] via SUBCUTANEOUS
  Administered 2023-05-31: 11 [IU] via SUBCUTANEOUS
  Administered 2023-05-31: 8 [IU] via SUBCUTANEOUS
  Administered 2023-05-31: 2 [IU] via SUBCUTANEOUS

## 2023-05-29 MED ORDER — SUCCINYLCHOLINE CHLORIDE 200 MG/10ML IV SOSY
PREFILLED_SYRINGE | INTRAVENOUS | Status: DC | PRN
  Administered 2023-05-29: 60 mg via INTRAVENOUS

## 2023-05-29 MED ORDER — FENTANYL CITRATE (PF) 100 MCG/2ML IJ SOLN: 25.0000 ug | INTRAMUSCULAR | Status: DC | PRN

## 2023-05-29 MED ORDER — PROPOFOL 10 MG/ML IV BOLUS
INTRAVENOUS | Status: AC
  Filled 2023-05-29: qty 20

## 2023-05-29 MED ORDER — EPHEDRINE SULFATE (PRESSORS) 50 MG/ML IJ SOLN
INTRAMUSCULAR | Status: DC | PRN
  Administered 2023-05-29 (×2): 2.5 mg via INTRAVENOUS
  Administered 2023-05-29: 5 mg via INTRAVENOUS
  Administered 2023-05-29: 2.5 mg via INTRAVENOUS

## 2023-05-29 MED ORDER — PROCHLORPERAZINE EDISYLATE 10 MG/2ML IJ SOLN: 5.0000 mg | Freq: Four times a day (QID) | INTRAMUSCULAR | Status: DC | PRN

## 2023-05-29 MED ORDER — LIDOCAINE 2% (20 MG/ML) 5 ML SYRINGE
INTRAMUSCULAR | Status: AC
  Filled 2023-05-29: qty 5

## 2023-05-29 MED ORDER — POLYVINYL ALCOHOL 1.4 % OP SOLN
1.0000 [drp] | OPHTHALMIC | Status: DC | PRN
  Filled 2023-05-29: qty 15

## 2023-05-29 MED ORDER — POLYETHYLENE GLYCOL 3350 17 G PO PACK: 17.0000 g | PACK | Freq: Every day | ORAL | Status: DC | PRN

## 2023-05-29 MED ORDER — SODIUM CHLORIDE 0.9 % IV SOLN: INTRAVENOUS | Status: DC | PRN

## 2023-05-29 MED ORDER — DEXAMETHASONE SODIUM PHOSPHATE 10 MG/ML IJ SOLN
INTRAMUSCULAR | Status: DC | PRN
  Administered 2023-05-29: 5 mg via INTRAVENOUS

## 2023-05-29 MED ORDER — SODIUM CHLORIDE 0.9 % IV SOLN
2.0000 g | INTRAVENOUS | Status: DC
  Administered 2023-05-29 – 2023-05-30 (×2): 2 g via INTRAVENOUS
  Filled 2023-05-29 (×2): qty 20

## 2023-05-29 SURGICAL SUPPLY — 27 items
BAG COUNTER SPONGE SURGICOUNT (BAG) ×1 IMPLANT
BAG URINE DRAIN 2000ML AR STRL (UROLOGICAL SUPPLIES) IMPLANT
BAG URO CATCHER STRL LF (MISCELLANEOUS) ×1 IMPLANT
CATH FOLEY 2WAY SLVR 5CC 18FR (CATHETERS) IMPLANT
CATH URETL OPEN END 6FR 70 (CATHETERS) IMPLANT
ELECT REM PT RETURN 9FT ADLT (ELECTROSURGICAL) IMPLANT
ELECTRODE REM PT RTRN 9FT ADLT (ELECTROSURGICAL) IMPLANT
FIBER LASER TRAC TIP (UROLOGICAL SUPPLIES) IMPLANT
GLOVE BIO SURGEON STRL SZ7.5 (GLOVE) ×1 IMPLANT
GLOVE BIOGEL PI IND STRL 8 (GLOVE) IMPLANT
GOWN STRL REUS W/ TWL LRG LVL3 (GOWN DISPOSABLE) ×1 IMPLANT
GOWN STRL SURGICAL XL XLNG (GOWN DISPOSABLE) IMPLANT
GUIDEWIRE ANG ZIPWIRE 038X150 (WIRE) ×1 IMPLANT
GUIDEWIRE STR DUAL SENSOR (WIRE) ×1 IMPLANT
IV NS 1000ML BAXH (IV SOLUTION) ×1 IMPLANT
KIT TURNOVER KIT B (KITS) IMPLANT
MANIFOLD NEPTUNE II (INSTRUMENTS) ×2 IMPLANT
NS IRRIG 1000ML POUR BTL (IV SOLUTION) ×2 IMPLANT
PACK CYSTO (CUSTOM PROCEDURE TRAY) ×1 IMPLANT
SET IRRIG Y TYPE TUR BLADDER L (SET/KITS/TRAYS/PACK) IMPLANT
SOL .9 NS 3000ML IRR UROMATIC (IV SOLUTION) ×1 IMPLANT
SOL PREP POV-IOD 4OZ 10% (MISCELLANEOUS) ×1 IMPLANT
SYR 10ML LL (SYRINGE) ×1 IMPLANT
SYR TOOMEY IRRIG 70ML (MISCELLANEOUS) ×1 IMPLANT
SYRINGE TOOMEY IRRIG 70ML (MISCELLANEOUS) IMPLANT
TUBE CONNECTING 12X1/4 (SUCTIONS) IMPLANT
TUBE PU 8FR 16IN ENFIT (TUBING) IMPLANT

## 2023-05-29 NOTE — Hospital Course (Signed)
 83 y.o. male with medical history significant for BPH, type 2 diabetes, chronic HFpEF, recently admitted on 05/15/2023 for left-sided pyelonephritis and discharged on 05/17/2023 who initially presented to [facility] ED due to generalized weakness, fall, and head injury.   In the ER, tachycardic with soft BPs.  Lab studies notable for leukocytosis greater than 34,000, lactic acidosis 2.9, elevated creatinine above baseline, UA positive for pyuria.  Code sepsis was called in the ER.  CT chest abdomen pelvis was ordered to look for other sources of infection.  It revealed 8 mm distal left ureteral stone with mild hydronephrosis, prostate enlargement, bladder wall thickening that may be related to chronic bladder outlet obstruction.  The patient received his first dose of IV antibiotics and IV fluid per sepsis protocol in the ER.   EDP discussed the case with urology who recommended admission by medicine team for further management.  Urology, Dr. Pete Glatter, planned for ureteral stent placement.  Admitted by [facility], hospitalist service.

## 2023-05-29 NOTE — Anesthesia Preprocedure Evaluation (Addendum)
 Anesthesia Evaluation  Patient identified by MRN, date of birth, ID band Patient awake    Reviewed: Allergy & Precautions, NPO status , Patient's Chart, lab work & pertinent test results  Airway Mallampati: III  TM Distance: >3 FB Neck ROM: Full    Dental no notable dental hx.    Pulmonary neg pulmonary ROS   Pulmonary exam normal        Cardiovascular negative cardio ROS Normal cardiovascular exam  ECHO:  1. Left ventricular ejection fraction, by estimation, is 60 to 65%. Left  ventricular ejection fraction by PLAX is 61 %. The left ventricle has  normal function. The left ventricle has no regional wall motion  abnormalities. Left ventricular diastolic  parameters are consistent with Grade I diastolic dysfunction (impaired  relaxation).   2. Right ventricular systolic function is normal. The right ventricular  size is normal. The estimated right ventricular systolic pressure is 20.0  mmHg.   3. The mitral valve is grossly normal. Trivial mitral valve  regurgitation.   4. The aortic valve is tricuspid. Aortic valve regurgitation is not  visualized.   5. The inferior vena cava is dilated in size with >50% respiratory  variability, suggesting right atrial pressure of 8 mmHg.     Neuro/Psych negative neurological ROS  negative psych ROS   GI/Hepatic negative GI ROS, Neg liver ROS,,,  Endo/Other  diabetes, Insulin Dependent, Oral Hypoglycemic Agents    Renal/GU ARFRenal disease     Musculoskeletal negative musculoskeletal ROS (+)    Abdominal   Peds  Hematology negative hematology ROS (+)   Anesthesia Other Findings Left ureteral calculus Sepsis  Reproductive/Obstetrics                             Anesthesia Physical Anesthesia Plan  ASA: 3 and emergent  Anesthesia Plan: General   Post-op Pain Management:    Induction: Intravenous  PONV Risk Score and Plan: 2 and Ondansetron,  Dexamethasone and Treatment may vary due to age or medical condition  Airway Management Planned: LMA  Additional Equipment:   Intra-op Plan:   Post-operative Plan: Extubation in OR  Informed Consent: I have reviewed the patients History and Physical, chart, labs and discussed the procedure including the risks, benefits and alternatives for the proposed anesthesia with the patient or authorized representative who has indicated his/her understanding and acceptance.     Dental advisory given  Plan Discussed with: CRNA  Anesthesia Plan Comments:         Anesthesia Quick Evaluation

## 2023-05-29 NOTE — Op Note (Signed)
 OPERATIVE NOTE   Patient Name: Charles Strong  MRN: [id number]   Date of Procedure: 05/29/23    Preoperative diagnosis:  Left ureteral calculus UTI Sepsis  Postoperative diagnosis:  Left ureteral calculus UTI Sepsis  Procedure:  Cystoscopy Left retrograde pyelogram Insertion of left ureteral stent  Attending: Milderd Meager, MD  Anesthesia: General  Estimated blood loss: 10 ml  Fluids: Per anesthesia record  Drains: 26F x 26 cm left ureteral stent (no tether); 7F foley  Specimens: None  Antibiotics: Zosyn 3.375 g IV  Findings:  Significant bilobar enlargement of prostate with large median lobe protruding into bladder making visualization of trigone very difficult; some bleeding noted with movement of cystoscope; no bladder lesions  Indications:  83 year old male presented to the emergency room with dehydration and weakness.  He reported fever to 100 degrees and chills.  He also had left flank pain and abdominal pain.  Evaluation demonstrated a white blood cell count of 30 4.4K, lactate of 2.9, and creatinine of 1.92.  Urinalysis showed >50 RBCs, >50 WBCs, and many bacteria.  CT abdomen and pelvis with contrast showed an 8 mm calculus in the distal left ureter with mild left hydronephrosis, thick-walled bladder, and enlarged prostate.  He was hypotensive and tachycardic in the emergency room.  He received IV antibiotics as well as IV fluids. He presents now for surgical management with cystoscopy, left retrograde pyelogram, and left ureteral stent placement.  The procedure including potential risk discussed with the patient.  He understands and wishes to proceed as described.  Description of Procedure:  The patient received IV Zosyn preoperatively.  He was taken to the operating room suite and properly identified.  After successful induction of a general anesthetic, the patient was placed in the dorsolithotomy position.  The patient's genital area was prepped and  draped in sterile fashion.  A preoperative timeout was performed.  Under direct visualization, a 21 French rigid cystoscope was passed through the urethra and into the bladder.  The patient was noted to have significant bilateral enlargement of the prostate with a very large median lobe which protruded into the bladder.  There was minimal bleeding noted with movement of the scope over the median lobe.  The extent of the median lobe made visualization of the trigone difficult.  I was able to identify the left UO.  The bladder was inspected and noted to be free of any obvious lesions.  Trabeculations and cellules were noted.  I used a angled Glidewire passed through an open-ended catheter to attempt to access the left ureter.  Again this was very difficult due to the limited visualization caused by the large median lobe and bleeding from the prostatic urethra.  I was eventually able to pass the Glidewire into the distal ureter.  The Glidewire passed all the way up into the left renal pelvis under fluoroscopic guidance.  I then passed the 6 [place] open-ended catheter up to the proximal ureter.  Contrast was injected confirming position within the proximal ureter.  A dilated left renal pelvis was noted.  A sensor guidewire was placed through the open-ended catheter and into the left collecting system.  A 6 French by 26 cm double-J stent was then passed over the guidewire and into the left renal pelvis.  Position was confirmed with fluoroscopy.  The tether was removed prior to stent placement.  At this point the cystoscope was removed.  An 81 French Foley catheter was placed.  The urine was slightly bloody at the  end of the procedure.  The patient was extubated and taken to the post anesthesia care unit in stable condition.  Complications: None  Condition: Stable, extubated, transferred to PACU  Plan:  Continue left ureteral stent until complete resolution of UTI. Continue Foley x 24-48 hours. Will eventually  need definitive treatment of his left ureteral calculus.

## 2023-05-29 NOTE — Interval H&P Note (Signed)
 History and Physical Interval Note:  05/29/2023 4:26 AM  Charles Strong  has presented today for surgery, with the diagnosis of Left ureteral calcluus, sepsis.  The various methods of treatment have been discussed with the patient and family. After consideration of risks, benefits and other options for treatment, the patient has consented to  Procedure(s): CYSTOSCOPY, WITH RETROGRADE PYELOGRAM AND URETERAL STENT INSERTION (Left) as a surgical intervention.  The patient's history has been reviewed, patient examined, no change in status, stable for surgery.  I have reviewed the patient's chart and labs.  Questions were answered to the patient's satisfaction.     Di Kindle

## 2023-05-29 NOTE — Progress Notes (Signed)
 PHARMACY - PHYSICIAN COMMUNICATION CRITICAL VALUE ALERT - BLOOD CULTURE IDENTIFICATION (BCID)  Charles Strong is an 83 y.o. male who presented to [facility] on 05/28/2023 with a chief complaint of generalized weakness, fall, and head injury.   Assessment:  1 of 4 bottles Klebsiella, no resistance. Patient with Sepsis secondary to complicated UTI in the setting of distal left ureteral kidney stone.   Name of physician (or Provider) Contacted: Dr. Rhona Leavens  Current antibiotics: Zosyn  Changes to prescribed antibiotics recommended:  Recommendations accepted by provider - narrow to ceftriaxone 2g q24h  Results for orders placed or performed during the hospital encounter of 05/28/23  Blood Culture ID Panel (Reflexed) (Collected: 05/28/2023  5:50 PM)  Result Value Ref Range   Enterococcus faecalis NOT DETECTED NOT DETECTED   Enterococcus Faecium NOT DETECTED NOT DETECTED   Listeria monocytogenes NOT DETECTED NOT DETECTED   Staphylococcus species NOT DETECTED NOT DETECTED   Staphylococcus aureus (BCID) NOT DETECTED NOT DETECTED   Staphylococcus epidermidis NOT DETECTED NOT DETECTED   Staphylococcus lugdunensis NOT DETECTED NOT DETECTED   Streptococcus species NOT DETECTED NOT DETECTED   Streptococcus agalactiae NOT DETECTED NOT DETECTED   Streptococcus pneumoniae NOT DETECTED NOT DETECTED   Streptococcus pyogenes NOT DETECTED NOT DETECTED   A.calcoaceticus-baumannii NOT DETECTED NOT DETECTED   Bacteroides fragilis NOT DETECTED NOT DETECTED   Enterobacterales DETECTED (A) NOT DETECTED   Enterobacter cloacae complex NOT DETECTED NOT DETECTED   Escherichia coli NOT DETECTED NOT DETECTED   Klebsiella aerogenes NOT DETECTED NOT DETECTED   Klebsiella oxytoca NOT DETECTED NOT DETECTED   Klebsiella pneumoniae DETECTED (A) NOT DETECTED   Proteus species NOT DETECTED NOT DETECTED   Salmonella species NOT DETECTED NOT DETECTED   Serratia marcescens NOT DETECTED NOT DETECTED   Haemophilus influenzae  NOT DETECTED NOT DETECTED   Neisseria meningitidis NOT DETECTED NOT DETECTED   Pseudomonas aeruginosa NOT DETECTED NOT DETECTED   Stenotrophomonas maltophilia NOT DETECTED NOT DETECTED   Candida albicans NOT DETECTED NOT DETECTED   Candida auris NOT DETECTED NOT DETECTED   Candida glabrata NOT DETECTED NOT DETECTED   Candida krusei NOT DETECTED NOT DETECTED   Candida parapsilosis NOT DETECTED NOT DETECTED   Candida tropicalis NOT DETECTED NOT DETECTED   Cryptococcus neoformans/gattii NOT DETECTED NOT DETECTED   CTX-M ESBL NOT DETECTED NOT DETECTED   Carbapenem resistance IMP NOT DETECTED NOT DETECTED   Carbapenem resistance KPC NOT DETECTED NOT DETECTED   Carbapenem resistance NDM NOT DETECTED NOT DETECTED   Carbapenem resist OXA 48 LIKE NOT DETECTED NOT DETECTED   Carbapenem resistance VIM NOT DETECTED NOT DETECTED    Loralee Pacas, PharmD, BCPS 05/29/2023  3:42 PM

## 2023-05-29 NOTE — ED Notes (Signed)
 Patient transported to [facility] ED via [facility]

## 2023-05-29 NOTE — Evaluation (Signed)
 Physical Therapy Evaluation Patient Details Name: Charles Strong MRN: [id number] DOB: [birth year] Today's Date: 05/29/2023  History of Present Illness  Pt is a 83 y/o male presenting on 4/2 with generalized weakness. Found with sepsis secondary to complicated UTI in setting of L ureteral kidney stone. 4/3 s/p cystoscopy, L retrograde pyelogram and insertion lf L ureteral stent. PMH includes: DM 2, recent hospital stay 3/20-22 with fall and nasal fx.  Clinical Impression  Pt is a 83 y.o. male presenting on 05/28/23 with above conditions and deficits below, see PT Problem List. PTA pt independently lived in a two level home with his wife, worked, drove, and had 3-7 STE in his house. Currently the pt is supervision for bed mobility, CGA-supervision for transfers, and CGA for gait. Pt displays deficits in global LE strength, activity tolerance, functional mobility, dynamic balance, and gait and would benefit from continued acute PT. Given the pt's PLOF, available family support, and current condition, he would benefit from OPPT follow-up to promote return to independent function. Pt would benefit from continued mobility with nurses and mobility specialists until d/c. Will continue to follow acutely.           If plan is discharge home, recommend the following: A little help with walking and/or transfers;A little help with bathing/dressing/bathroom;Assistance with cooking/housework;Assist for transportation;Help with stairs or ramp for entrance   Can travel by private vehicle        Equipment Recommendations None recommended by PT  Recommendations for Other Services       Functional Status Assessment Patient has had a recent decline in their functional status and demonstrates the ability to make significant improvements in function in a reasonable and predictable amount of time.     Precautions / Restrictions Precautions Precautions: Fall Recall of Precautions/Restrictions:  Intact Precaution/Restrictions Comments: foley Restrictions Weight Bearing Restrictions Per Provider Order: No      Mobility  Bed Mobility Overal bed mobility: Needs Assistance Bed Mobility: Supine to Sit     Supine to sit: Supervision, HOB elevated, Used rails     General bed mobility comments: pt requires elevated HOB and increased time. Uses bed rails    Transfers Overall transfer level: Needs assistance Equipment used: None Transfers: Sit to/from Stand Sit to Stand: Contact guard assist, Supervision           General transfer comment: pt performed 2 sit to stands. 1st CGA, 2nd supervision for safety. stands by pushing up from bil knees.    Ambulation/Gait Ambulation/Gait assistance: Contact guard assist Gait Distance (Feet): 180 Feet Assistive device: None Gait Pattern/deviations: Step-through pattern, Wide base of support, Decreased stride length Gait velocity: normal Gait velocity interpretation: >4.37 ft/sec, indicative of normal walking speed   General Gait Details: pt walks with increased out-toeing bil. No increased sway with gait challenges but he does reduces his speed  Stairs            Wheelchair Mobility     Tilt Bed    Modified Rankin (Stroke Patients Only)       Balance Overall balance assessment: Needs assistance Sitting-balance support: No upper extremity supported, Feet supported Sitting balance-Leahy Scale: Good Sitting balance - Comments: pt sits EOB with increased trunk flexion. Able to reach to his feet to don underwear, CGA for safety.   Standing balance support: During functional activity, No upper extremity supported Standing balance-Leahy Scale: Fair Standing balance comment: CGA for safety. pt stands with increased BOS and out-toeing  Pertinent Vitals/Pain Pain Assessment Pain Assessment: No/denies pain    Home Living Family/patient expects to be discharged to:: Private  residence Living Arrangements: Spouse/significant other Available Help at Discharge: Family;Available 24 hours/day Type of Home: House Home Access: Stairs to enter Entrance Stairs-Rails: Right Entrance Stairs-Number of Steps: 3 in garage, 7 front   Home Layout: Two level;Full bath on main level;Able to live on main level with bedroom/bathroom Home Equipment: Shower seat - built in;Cane - single point;BSC/3in1 Additional Comments: corner shower chair    Prior Function Prior Level of Function : Independent/Modified Independent;Driving;Working/employed             Mobility Comments: independent 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[employer], transportation for fire dpt     Extremity/Trunk Assessment   Upper Extremity Assessment Upper Extremity Assessment: Defer to OT evaluation    Lower Extremity Assessment Lower Extremity Assessment: Generalized weakness (pt displays WFL LE AROM when donning underwear)    Cervical / Trunk Assessment Cervical / Trunk Assessment: Normal  Communication   Communication Communication: No apparent difficulties    Cognition Arousal: Alert Behavior During Therapy: WFL for tasks assessed/performed   PT - Cognitive impairments: No apparent impairments                         Following commands: Intact       Cueing Cueing Techniques: Verbal cues     General Comments General comments (skin integrity, edema, etc.): VSS throughout session    Exercises     Assessment/Plan    PT Assessment Patient needs continued PT services  PT Problem List Decreased strength;Decreased range of motion;Decreased activity tolerance;Decreased balance;Decreased mobility;Decreased coordination;Cardiopulmonary status limiting activity       PT Treatment Interventions DME instruction;Gait training;Stair training;Functional mobility training;Therapeutic activities;Therapeutic exercise;Balance training;Neuromuscular re-education;Patient/family education     PT Goals (Current goals can be found in the Care Plan section)  Acute Rehab PT Goals Patient Stated Goal: return home PT Goal Formulation: With patient/family Time For Goal Achievement: 06/12/23 Potential to Achieve Goals: Good    Frequency Min 2X/week     Co-evaluation PT/OT/SLP Co-Evaluation/Treatment: Yes Reason for Co-Treatment: To address functional/ADL transfers;Other (comment) (tolerance) PT goals addressed during session: Mobility/safety with mobility;Balance;Strengthening/ROM OT goals addressed during session: ADL's and self-care       AM-PAC PT "6 Clicks" Mobility  Outcome Measure Help needed turning from your back to your side while in a flat bed without using bedrails?: A Little Help needed moving from lying on your back to sitting on the side of a flat bed without using bedrails?: A Little Help needed moving to and from a bed to a chair (including a wheelchair)?: A Little Help needed standing up from a chair using your arms (e.g., wheelchair or bedside chair)?: A Little Help needed to walk in hospital room?: A Little Help needed climbing 3-5 steps with a railing? : A Little 6 Click Score: 18    End of Session Equipment Utilized During Treatment: Gait belt Activity Tolerance: Patient tolerated treatment well Patient left: in chair;with call bell/phone within reach;with chair alarm set;with family/visitor present   PT Visit Diagnosis: Muscle weakness (generalized) (M62.81);Other abnormalities of gait and mobility (R26.89)    Time: 4166-0630 PT Time Calculation (min) (ACUTE ONLY): 26 min   Charges:   PT Evaluation $PT Eval Low Complexity: 1 Low   PT General Charges $$ ACUTE PT VISIT: 1 Visit         [employer], SPT  [facility]man [place]man 05/29/2023, 3:57 PM

## 2023-05-29 NOTE — Progress Notes (Signed)
 Progress Note   Patient: Charles Strong WUJ:[id number] DOB: 10-Aug-1940 DOA: 05/28/2023     0 DOS: the patient was seen and examined on 05/29/2023   Brief hospital course: 83 y.o. male with medical history significant for BPH, type 2 diabetes, chronic HFpEF, recently admitted on 05/15/2023 for left-sided pyelonephritis and discharged on 05/17/2023 who initially presented to [facility] ED due to generalized weakness, fall, and head injury.   In the ER, tachycardic with soft BPs.  Lab studies notable for leukocytosis greater than 34,000, lactic acidosis 2.9, elevated creatinine above baseline, UA positive for pyuria.  Code sepsis was called in the ER.  CT chest abdomen pelvis was ordered to look for other sources of infection.  It revealed 8 mm distal left ureteral stone with mild hydronephrosis, prostate enlargement, bladder wall thickening that may be related to chronic bladder outlet obstruction.  The patient received his first dose of IV antibiotics and IV fluid per sepsis protocol in the ER.   EDP discussed the case with urology who recommended admission by medicine team for further management.  Urology, Dr. Pete Glatter, planned for ureteral stent placement.  Admitted by [facility], hospitalist service.  Assessment and Plan: Sepsis secondary to complicated UTI in the setting of distal left ureteral kidney stone, POA Presented with leukocytosis greater than 34K, lactic acidosis 2.9, UA positive for pyuria Continued on IV Zosyn, IV fluid Urology following. Pt now s/p stent placement Plan to keep foley x 24-48h per Urology F/u on culture results   AKI, suspect multifactorial, from chronic bladder outlet obstruction in the setting of BPH, and sepsis. Given IVF hydration Recheck bmet in AM   Mild non-anion gap metabolic acidosis in the setting of acute renal insufficiency Presented with serum bicarb of 21 and anion gap of 14 given IV fluid hydration Follow bmet trends   Elevated liver chemistries,  unclear etiology Alkaline phosphatase 205, AST 45 improving   Type 2 diabetes with hyperglycemia Last hemoglobin A1c 7.3 on 05/16/2023 Presented with serum glucose 253 Continue ssi as needed   BPH Urology following Currently with foley cath   Chronic HFpEF Euvolemic on exam Last 2D echo done on 05/16/2023 revealed LVEF 60-65% and grade 1 diastolic dysfunction. Closely monitor volume status while on IV fluid hydration Monitor strict I's and O's and daily weight.   Generalized weakness, suspect contributed by sepsis Continue to treat underlying conditions PT OT consulted   Recent fall with head trauma CT maxillo facial without contrast revealed minimally displaced nasal bone fractures No acute intracranial abnormality on CT head without contrast.   Degenerative disc and facet disease.  No acute bony abnormality seen on CT cervical spine without contrast. Continue fall precautions and as needed analgesics       Subjective: Seen this AM after procedure. Complains of constipation, asking for miralax  Physical Exam: Vitals:   05/29/23 0715 05/29/23 0730 05/29/23 0800 05/29/23 1138  BP: 114/65 109/66 114/67 114/69  Pulse: 81 84 89 98  Resp: (!) 21 19 19 16   Temp:  97.7 F (36.5 C) 97.8 F (36.6 C) 97.6 F (36.4 C)  TempSrc:   Oral Oral  SpO2: 96% 96% 92% 96%  Weight:      Height:       General exam: Awake, laying in bed, in nad Respiratory system: Normal respiratory effort, no wheezing Cardiovascular system: regular rate, s1, s2 Gastrointestinal system: Soft, nondistended, positive BS Central nervous system: CN2-12 grossly intact, strength intact Extremities: Perfused, no clubbing Skin: Normal skin turgor, no  notable skin lesions seen Psychiatry: Mood normal // no visual hallucinations   Data Reviewed:  Labs reviewed: Na 137, K 4.3, Cr 1.17, WBC 31.7, Hgb 11.5  Family Communication: Pt in room, pt's family at bedside  Disposition: Status is: Inpatient Remains  inpatient appropriate because: severity of illness  Planned Discharge Destination: Home    Author: Rickey Barbara, MD 05/29/2023 2:22 PM  For on call review [email or web address].

## 2023-05-29 NOTE — Progress Notes (Signed)
 Pharmacy Antibiotic Note  CLETE KUCH is a 83 y.o. male admitted on 05/28/2023 with sepsis 2/2 UTI.  Pharmacy has been consulted for zosyn dosing.  Plan: Zosyn 3.375g q8h  F/u renal function, infectious work up and length of therapy   Height: 5\' 10"  (177.8 cm) Weight: 63.5 kg (140 lb) IBW/kg (Calculated) : 73  Temp (24hrs), Avg:97.8 F (36.6 C), Min:97.6 F (36.4 C), Max:97.9 F (36.6 C)  Recent Labs  Lab 05/28/23 1815 05/28/23 2052  WBC 34.4*  --   CREATININE 1.92*  --   LATICACIDVEN 2.9* 2.0*    Estimated Creatinine Clearance: 27.6 mL/min (A) (by C-G formula based on SCr of 1.92 mg/dL (H)).    Allergies  Allergen Reactions   Calcium Fortified Cookie [Nutritional Supplements] Diarrhea   Egg-Derived Products Diarrhea   Metformin And Related Diarrhea    Antimicrobials this admission: Zosyn 4/2 >  Microbiology results: 4/2 resp panel: negative 4/2 bcx:  4/2 Ucx:  Thank you for allowing pharmacy to be a part of this patient's care.  Marja Kays 05/29/2023 4:26 AM

## 2023-05-29 NOTE — Anesthesia Postprocedure Evaluation (Signed)
 Anesthesia Post Note  Patient: Charles Strong  Procedure(s) Performed: CYSTOSCOPY, WITH RETROGRADE PYELOGRAM AND URETERAL STENT INSERTION (Left)     Patient location during evaluation: Nursing Unit Anesthesia Type: General Level of consciousness: awake and alert Pain management: pain level controlled Vital Signs Assessment: post-procedure vital signs reviewed and stable Respiratory status: spontaneous breathing, nonlabored ventilation and respiratory function stable Cardiovascular status: blood pressure returned to baseline and stable Postop Assessment: no apparent nausea or vomiting Anesthetic complications: no   No notable events documented.  Last Vitals:  Vitals:   05/29/23 0730 05/29/23 0800  BP: 109/66 114/67  Pulse: 84 89  Resp: 19 19  Temp: 36.5 C 36.6 C  SpO2: 96% 92%    Last Pain:  Vitals:   05/29/23 0800  TempSrc: Oral  PainSc: 0-No pain                 Collene Schlichter

## 2023-05-29 NOTE — Transfer of Care (Signed)
 Immediate Anesthesia Transfer of Care Note  Patient: Charles Strong  Procedure(s) Performed: CYSTOSCOPY, WITH RETROGRADE PYELOGRAM AND URETERAL STENT INSERTION (Left)  Patient Location: PACU  Anesthesia Type:General  Level of Consciousness: awake, alert , and oriented  Airway & Oxygen Therapy: Patient Spontanous Breathing and Patient connected to nasal cannula oxygen  Post-op Assessment: Report given to RN, Post -op Vital signs reviewed and stable, and Patient moving all extremities  Post vital signs: Reviewed and stable  Last Vitals:  Vitals Value Taken Time  BP 104/61 05/29/23 0700  Temp 36.5 C 05/29/23 0700  Pulse 85 05/29/23 0707  Resp 21 05/29/23 0707  SpO2 96 % 05/29/23 0707  Vitals shown include unfiled device data.  Last Pain:  Vitals:   05/29/23 0700  TempSrc:   PainSc: Asleep         Complications: No notable events documented.

## 2023-05-29 NOTE — Consult Note (Signed)
 Urology Consult   Physician requesting consult: Evlyn Kanner, PA-C  Reason for consult: Ureteral calculus, sepsis  History of Present Illness: Charles Strong is a 83 y.o. male who presented to the emergency room last night with dehydration and weakness.  He was recently admitted to the hospital for AKI and suspected pyelonephritis.  Since his discharge on 05/17/2023 he has not been doing well.  He reports a fever to 100 degrees at home and chills.  He also has had left flank and abdominal pain, intermittent in nature.  He was discharged on cefadroxil which he completed.  Urine culture from 05/15/2023 showed no growth. Evaluation in the emergency room demonstrated a white blood cell count of 34.4, lactate of 2.9, and creatinine of 1.92.  Urinalysis showed >50 RBCs, >50 WBCs, and many bacteria. CT abdomen and pelvis with contrast showed an 8 mm calculus in the distal left ureter with mild left hydronephrosis, thick-walled bladder, and enlarged prostate. He has been hypotensive and tachycardic in the emergency room.  He received IV Zosyn as well as IV fluids.  He has a history of BPH and has been managed with silodosin and finasteride. He is followed by Dr. Mena Goes.  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  Allergies:  Allergies  Allergen Reactions   Calcium Fortified Cookie [Nutritional Supplements] Diarrhea   Egg-Derived Products Diarrhea   Metformin And Related Diarrhea    History reviewed. No pertinent family history.  Social  History:  reports that he has never smoked. He has never used smokeless tobacco. He reports that he does not drink alcohol and does not use drugs.  ROS: A complete review of systems was performed.  All systems are negative except for pertinent findings as noted.  Physical Exam:  Vital signs in last 24 hours: Temp:  [97.6 F (36.4 C)-97.8 F (36.6 C)] 97.8 F (36.6 C) (04/03 0306) Pulse Rate:  [66-131] 71 (04/03 0300) Resp:  [17-28] 19 (04/03 0300) BP: (81-118)/(52-83) 111/71 (04/03 0300) SpO2:  [94 %-99 %] 96 % (04/03 0300) Weight:  [63.5 kg] 63.5 kg (04/02 1737) GENERAL APPEARANCE:  Well appearing, well developed, well nourished, NAD HEENT:  Atraumatic, normocephalic, oropharynx clear NECK:  Supple without lymphadenopathy or thyromegaly ABDOMEN:  Soft, non-tender, no masses EXTREMITIES:  Moves all extremities well, without clubbing, cyanosis, or edema NEUROLOGIC:  Alert and oriented x 3, normal gait, CN II-XII grossly intact MENTAL STATUS:  appropriate BACK:  Non-tender to palpation, No CVAT SKIN:  Warm, dry, and intact  Laboratory Data:  Recent Labs    05/28/23 1815  WBC 34.4*  HGB 13.7  HCT 39.6  PLT 163    Recent Labs    05/28/23 1815  NA 133*  K 3.9  CL 98  GLUCOSE 253*  BUN 25*  CALCIUM 8.6*  CREATININE 1.92*     Results for orders placed or performed during the hospital encounter of 05/28/23 (from the past 24 hours)  Culture, blood (Routine x 2)     Status: None (Preliminary  result)   Collection Time: 05/28/23  5:39 PM   Specimen: BLOOD RIGHT FOREARM  Result Value Ref Range   Specimen Description      BLOOD RIGHT FOREARM Performed at [facility], [address], [place], [place] [address]    Special Requests      BOTTLES DRAWN AEROBIC AND ANAEROBIC Blood Culture adequate volume Performed at [facility], [address] [place], [place] [address]    Culture PENDING    Report Status PENDING   Culture, blood (Routine x 2)      Status: None (Preliminary result)   Collection Time: 05/28/23  5:50 PM   Specimen: BLOOD RIGHT WRIST  Result Value Ref Range   Specimen Description      BLOOD RIGHT WRIST Performed at [facility], [address], [place], [place] [address]    Special Requests      BOTTLES DRAWN AEROBIC AND ANAEROBIC Blood Culture adequate volume Performed at [facility], [address] [place], [place] [address]    Culture PENDING    Report Status PENDING   Comprehensive metabolic panel     Status: Abnormal   Collection Time: 05/28/23  6:15 PM  Result Value Ref Range   Sodium 133 (L) 135 - 145 mmol/L   Potassium 3.9 3.5 - 5.1 mmol/L   Chloride 98 98 - 111 mmol/L   CO2 21 (L) 22 - 32 mmol/L   Glucose, Bld 253 (H) 70 - 99 mg/dL   BUN 25 (H) 8 - 23 mg/dL   Creatinine, Ser 5.36 (H) 0.61 - 1.24 mg/dL   Calcium 8.6 (L) 8.9 - 10.3 mg/dL   Total Protein 6.7 6.5 - 8.1 g/dL   Albumin 3.2 (L) 3.5 - 5.0 g/dL   AST 45 (H) 15 - 41 U/L   ALT 27 0 - 44 U/L   Alkaline Phosphatase 205 (H) 38 - 126 U/L   Total Bilirubin 1.2 0.0 - 1.2 mg/dL   GFR, Estimated 35 (L) >60 mL/min   Anion gap 14 5 - 15  Lactic acid, plasma     Status: Abnormal   Collection Time: 05/28/23  6:15 PM  Result Value Ref Range   Lactic Acid, Venous 2.9 (HH) 0.5 - 1.9 mmol/L  CBC with Differential     Status: Abnormal   Collection Time: 05/28/23  6:15 PM  Result Value Ref Range   WBC 34.4 (H) 4.0 - 10.5 K/uL   RBC 4.70 4.22 - 5.81 MIL/uL   Hemoglobin 13.7 13.0 - 17.0 g/dL   HCT 64.4 03.4 - 74.2 %   MCV 84.3 80.0 - 100.0 fL   MCH 29.1 26.0 - 34.0 pg   MCHC 34.6 30.0 - 36.0 g/dL   RDW 59.5 63.8 - 75.6 %   Platelets 163 150 - 400 K/uL   nRBC 0.0 0.0 - 0.2 %   Neutrophils Relative % 84 %   Neutro Abs 28.9 (H) 1.7 - 7.7 K/uL   Lymphocytes Relative 2 %   Lymphs Abs 0.8 0.7 - 4.0 K/uL   Monocytes Relative 5 %   Monocytes Absolute 1.7 (H) 0.1 - 1.0 K/uL   Eosinophils Relative 1 %   Eosinophils Absolute  0.2 0.0 - 0.5 K/uL   Basophils Relative 0 %   Basophils Absolute 0.1 0.0 - 0.1 K/uL   Immature Granulocytes 8 %   Abs Immature Granulocytes 2.77 (H) 0.00 - 0.07 K/uL  Protime-INR     Status: None   Collection  Time: 05/28/23  6:15 PM  Result Value Ref Range   Prothrombin Time 15.0 11.4 - 15.2 seconds   INR 1.2 0.8 - 1.2  Resp panel by RT-PCR (RSV, Flu A&B, Covid)     Status: None   Collection Time: 05/28/23  6:15 PM   Specimen: Nasal Swab  Result Value Ref Range   SARS Coronavirus 2 by RT PCR NEGATIVE NEGATIVE   Influenza A by PCR NEGATIVE NEGATIVE   Influenza B by PCR NEGATIVE NEGATIVE   Resp Syncytial Virus by PCR NEGATIVE NEGATIVE  APTT     Status: None   Collection Time: 05/28/23  6:15 PM  Result Value Ref Range   aPTT 35 24 - 36 seconds  TSH     Status: Abnormal   Collection Time: 05/28/23  6:15 PM  Result Value Ref Range   TSH 9.920 (H) 0.350 - 4.500 uIU/mL  Urinalysis, w/ Reflex to Culture (Infection Suspected) -Urine, Clean Catch     Status: Abnormal   Collection Time: 05/28/23  7:12 PM  Result Value Ref Range   Specimen Source URINE, CATHETERIZED    Color, Urine ORANGE (A) YELLOW   APPearance CLOUDY (A) CLEAR   Specific Gravity, Urine 1.025 1.005 - 1.030   pH 5.5 5.0 - 8.0   Glucose, UA NEGATIVE NEGATIVE mg/dL   Hgb urine dipstick LARGE (A) NEGATIVE   Bilirubin Urine NEGATIVE NEGATIVE   Ketones, ur 15 (A) NEGATIVE mg/dL   Protein, ur 161 (A) NEGATIVE mg/dL   Nitrite NEGATIVE NEGATIVE   Leukocytes,Ua LARGE (A) NEGATIVE   RBC / HPF >50 0 - 5 RBC/hpf   WBC, UA >50 0 - 5 WBC/hpf   Bacteria, UA MANY (A) NONE SEEN   Squamous Epithelial / HPF 0-5 0 - 5 /HPF   WBC Clumps PRESENT    Mucus PRESENT    Hyphae Yeast PRESENT   Lipase, blood     Status: Abnormal   Collection Time: 05/28/23  7:23 PM  Result Value Ref Range   Lipase 10 (L) 11 - 51 U/L  Lactic acid, plasma     Status: Abnormal   Collection Time: 05/28/23  8:52 PM  Result Value Ref Range   Lactic Acid,  Venous 2.0 (HH) 0.5 - 1.9 mmol/L   Recent Results (from the past 240 hours)  Culture, blood (Routine x 2)     Status: None (Preliminary result)   Collection Time: 05/28/23  5:39 PM   Specimen: BLOOD RIGHT FOREARM  Result Value Ref Range Status   Specimen Description   Final    BLOOD RIGHT FOREARM Performed at [facility], [address], [place], [place] [address]    Special Requests   Final    BOTTLES DRAWN AEROBIC AND ANAEROBIC Blood Culture adequate volume Performed at [facility], [address], [place], [place] [address]    Culture PENDING  Incomplete   Report Status PENDING  Incomplete  Culture, blood (Routine x 2)     Status: None (Preliminary result)   Collection Time: 05/28/23  5:50 PM   Specimen: BLOOD RIGHT WRIST  Result Value Ref Range Status   Specimen Description   Final    BLOOD RIGHT WRIST Performed at [facility], [address], [place], [place] [address]    Special Requests   Final    BOTTLES DRAWN AEROBIC AND ANAEROBIC Blood Culture adequate volume Performed at [facility], [address] [place], [place] [address]    Culture PENDING  Incomplete   Report Status PENDING  Incomplete  Resp panel by RT-PCR (RSV, Flu A&B, Covid)     Status: None   Collection Time: 05/28/23  6:15 PM   Specimen: Nasal Swab  Result Value Ref Range Status   SARS Coronavirus 2 by RT PCR NEGATIVE NEGATIVE Final    Comment: (NOTE) SARS-CoV-2 target nucleic acids are NOT DETECTED.  The SARS-CoV-2 RNA is generally detectable in upper respiratory specimens during the acute phase of infection. The lowest concentration of SARS-CoV-2 viral copies this assay can detect is 138 copies/mL. A negative result does not preclude SARS-Cov-2 infection and should not be used as the sole basis for treatment or other patient management decisions. A negative result may occur with  improper specimen collection/handling, submission of specimen  other than nasopharyngeal swab, presence of viral mutation(s) within the areas targeted by this assay, and inadequate number of viral copies(<138 copies/mL). A negative result must be combined with clinical observations, patient history, and epidemiological information. The expected result is Negative.  Fact Sheet for Patients:  BloggerCourse.com  Fact Sheet for Healthcare Providers:  SeriousBroker.it  This test is no t yet approved or cleared by the [place] FDA and  has been authorized for detection and/or diagnosis of SARS-CoV-2 by FDA under an Emergency Use Authorization (EUA). This EUA will remain  in effect (meaning this test can be used) for the duration of the COVID-19 declaration under Section 564(b)(1) of the Act, 21 U.S.C.section 360bbb-3(b)(1), unless the authorization is terminated  or revoked sooner.       Influenza A by PCR NEGATIVE NEGATIVE Final   Influenza B by PCR NEGATIVE NEGATIVE Final    Comment: (NOTE) The Xpert Xpress SARS-CoV-2/FLU/RSV plus assay is intended as an aid in the diagnosis of influenza from Nasopharyngeal swab specimens and should not be used as a sole basis for treatment. Nasal washings and aspirates are unacceptable for Xpert Xpress SARS-CoV-2/FLU/RSV testing.  Fact Sheet for Patients: BloggerCourse.com  Fact Sheet for Healthcare Providers: SeriousBroker.it  This test is not yet approved or cleared by the [place] FDA and has been authorized for detection and/or diagnosis of SARS-CoV-2 by FDA under an Emergency Use Authorization (EUA). This EUA will remain in effect (meaning this test can be used) for the duration of the COVID-19 declaration under Section 564(b)(1) of the Act, 21 U.S.C. section 360bbb-3(b)(1), unless the authorization is terminated or revoked.     Resp Syncytial Virus by PCR NEGATIVE NEGATIVE Final     Comment: (NOTE) Fact Sheet for Patients: BloggerCourse.com  Fact Sheet for Healthcare Providers: SeriousBroker.it  This test is not yet approved or cleared by the [place] FDA and has been authorized for detection and/or diagnosis of SARS-CoV-2 by FDA under an Emergency Use Authorization (EUA). This EUA will remain in effect (meaning this test can be used) for the duration of the COVID-19 declaration under Section 564(b)(1) of the Act, 21 U.S.C. section 360bbb-3(b)(1), unless the authorization is terminated or revoked.  Performed at [facility], [address] [place], [place] [address]     Renal Function: Recent Labs    05/28/23 1815  CREATININE 1.92*   Estimated Creatinine Clearance: 27.6 mL/min (A) (by C-G formula based on SCr of 1.92 mg/dL (H)).  Radiologic Imaging: CT ABDOMEN PELVIS W CONTRAST Result Date: 05/28/2023 CLINICAL DATA:  Sepsis Recent pyelonephritis.  Fall. EXAM: CT ABDOMEN AND PELVIS WITH CONTRAST TECHNIQUE: Multidetector CT imaging of the abdomen and pelvis was performed using the standard protocol following bolus  administration of intravenous contrast. RADIATION DOSE REDUCTION: This exam was performed according to the departmental dose-optimization program which includes automated exposure control, adjustment of the mA and/or kV according to patient size and/or use of iterative reconstruction technique. CONTRAST:  80mL OMNIPAQUE IOHEXOL 300 MG/ML  SOLN COMPARISON:  05/15/2023 FINDINGS: Lower chest: No acute abnormality Hepatobiliary: Small 1-2 mm layering gallstone, stable. No wall thickening or biliary ductal dilatation. No focal hepatic abnormality. Pancreas: No focal abnormality or ductal dilatation. Spleen: No focal abnormality.  Normal size. Adrenals/Urinary Tract: Adrenal glands normal. Previously seen left lower pole renal stone has migrated and is now in the distal left ureter  measuring 8 mm. Mild left hydronephrosis. No definite current signs of pyelonephritis. No stones or hydronephrosis on the right. Urinary bladder appears thick walled, partially decompressed. Stomach/Bowel: Sigmoid diverticulosis. No active diverticulitis. Stomach and small bowel decompressed, unremarkable. Vascular/Lymphatic: Aortic atherosclerosis. No evidence of aneurysm or adenopathy. Reproductive: Prostate enlargement. Other: No free fluid or free air. Musculoskeletal: No acute bony abnormality. IMPRESSION: Migration of the previously seen left lower pole renal stone into the left ureter. 8 mm stone in the distal left ureter of mild left hydronephrosis. Sigmoid diverticulosis. Aortic atherosclerosis. Prostate enlargement. Bladder wall thickening may be related to chronic bladder outlet obstruction. Electronically Signed   By: Charlett Nose M.D.   On: 05/28/2023 19:56   CT Maxillofacial WO CM Result Date: 05/28/2023 CLINICAL DATA:  Bruising around nose from fall. EXAM: CT MAXILLOFACIAL WITHOUT CONTRAST TECHNIQUE: Multidetector CT imaging of the maxillofacial structures was performed. Multiplanar CT image reconstructions were also generated. RADIATION DOSE REDUCTION: This exam was performed according to the departmental dose-optimization program which includes automated exposure control, adjustment of the mA and/or kV according to patient size and/or use of iterative reconstruction technique. COMPARISON:  None Available. FINDINGS: Osseous: Fractures noted through the nasal bones, minimally displaced. 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 Mandible and zygomatic arches intact. Orbits: No fracture or orbital emphysema.  Globes intact. Sinuses: Clear Soft tissues: Negative Limited intracranial: See head CT report IMPRESSION: Minimally displaced nasal bone fractures. Electronically Signed   By: Charlett Nose M.D.   On: 05/28/2023 19:52   CT Cervical Spine Wo Contrast Result  Date: 05/28/2023 CLINICAL DATA:  Fall EXAM: CT CERVICAL SPINE WITHOUT CONTRAST TECHNIQUE: Multidetector CT imaging of the cervical spine was performed without intravenous contrast. Multiplanar CT image reconstructions were also generated. RADIATION DOSE REDUCTION: This exam was performed according to the departmental dose-optimization program which includes automated exposure control, adjustment of the mA and/or kV according to patient size and/or use of iterative reconstruction technique. COMPARISON:  None Available. FINDINGS: Alignment: Normal. Skull base and vertebrae: No acute fracture. No primary bone lesion or focal pathologic process. Soft tissues and spinal canal: No prevertebral fluid or swelling. No visible canal hematoma. Disc levels: Diffuse mild degenerative disc disease with disc space narrowing and spurring. Mild bilateral degenerative facet disease, left greater than right. Upper chest: No acute findings. Calcified granuloma in the left upper lobe. Other: None IMPRESSION: Degenerative disc and facet disease.  No acute bony abnormality. Electronically Signed   By: Charlett Nose M.D.   On: 05/28/2023 19:50   CT Head Wo Contrast Result Date: 05/28/2023 CLINICAL DATA:  Fall EXAM: CT HEAD WITHOUT CONTRAST TECHNIQUE: Contiguous axial images were obtained from the base of the skull through the vertex without intravenous contrast. RADIATION DOSE REDUCTION: This exam was performed according to the departmental dose-optimization program which  includes automated exposure control, adjustment of the mA and/or kV according to patient size and/or use of iterative reconstruction technique. COMPARISON:  None Available. FINDINGS: Brain: No acute intracranial abnormality. Specifically, no hemorrhage, hydrocephalus, mass lesion, acute infarction, or significant intracranial injury. Vascular: No hyperdense vessel or unexpected calcification. Skull: No acute calvarial abnormality. Sinuses/Orbits: No acute findings Other:  None IMPRESSION: No acute intracranial abnormality. Electronically Signed   By: Charlett Nose M.D.   On: 05/28/2023 19:48   DG Chest 1 View Result Date: 05/28/2023 CLINICAL DATA:  Weakness and sepsis. EXAM: CHEST  1 VIEW COMPARISON:  05/15/2023 FINDINGS: Stable cardiomediastinal silhouette. Aortic atherosclerotic calcification. No focal consolidation, pleural effusion, or pneumothorax. No displaced rib fractures. IMPRESSION: No acute cardiopulmonary disease. Electronically Signed   By: Minerva Fester M.D.   On: 05/28/2023 19:42    I independently reviewed the above imaging studies.  Impression/Recommendation Left ureteral calculus Sepsis secondary to UTI AKI  The patient has an obstructing left ureteral calculus with associated UTI and sepsis as well as AKI. Recommend urgent management with placement of a left ureteral stent. Patient will be taken to the operating room for cystoscopy, left retrograde pyelogram, and insertion of left ureteral stent.  The procedure including potential risk was discussed with the patient in detail.  I also discussed the possible need for a percutaneous nephrostomy tube should stent insertion be unsuccessful. He will eventually need definitive management of the ureteral calculus once his UTI has been completely treated.  Elige Radon Rainer Mounce 05/29/2023, 3:09 AM

## 2023-05-29 NOTE — Evaluation (Signed)
 Occupational Therapy Evaluation Patient Details Name: Charles Strong MRN: [id number] DOB: 01/30/1941 Today's Date: 05/29/2023   History of Present Illness   Pt is a 83 y/o male presenting on 4/2 with generalized weakness. Found with sepsis secondary to complicated UTI in setting of L ureteral kidney stone. 4/3 s/p cystoscopy, L retrograde pyelogram and insertion lf L ureteral stent. PMH includes: DM 2, recent hospital stay 3/20-22 with fall and nasal fx.     Clinical Impressions PTA patient independent and driving. Admitted for above and presents with problem list below, including decreased activity tolerance and generalized weakness.Marland Kitchen  He requires supervision for bed mobility, min guard to supervision for transfers and up to min assist for ADLs.  VSS during session, discussed safety and pacing.  Patient will benefit from further OT acutely to optimize independence and safety, but anticipate no further needs required after dc home.      If plan is discharge home, recommend the following:   A little help with walking and/or transfers;A little help with bathing/dressing/bathroom;Assistance with cooking/housework     Functional Status Assessment   Patient has had a recent decline in their functional status and demonstrates the ability to make significant improvements in function in a reasonable and predictable amount of time.     Equipment Recommendations   Tub/shower seat     Recommendations for Other Services         Precautions/Restrictions   Precautions Precautions: Fall Recall of Precautions/Restrictions: Intact Precaution/Restrictions Comments: foley Restrictions Weight Bearing Restrictions Per Provider Order: No     Mobility Bed Mobility Overal bed mobility: Needs Assistance Bed Mobility: Supine to Sit     Supine to sit: Supervision          Transfers Overall transfer level: Needs assistance Equipment used: None Transfers: Sit to/from Stand Sit  to Stand: Contact guard assist, Supervision           General transfer comment: min guard fading to supervision      Balance Overall balance assessment: Needs assistance Sitting-balance support: No upper extremity supported, Feet supported Sitting balance-Leahy Scale: Good     Standing balance support: During functional activity, No upper extremity supported Standing balance-Leahy Scale: Fair                             ADL either performed or assessed with clinical judgement   ADL Overall ADL's : Needs assistance/impaired     Grooming: Contact guard assist;Standing           Upper Body Dressing : Set up;Sitting   Lower Body Dressing: Minimal assistance;Sit to/from stand   Toilet Transfer: Electronics engineer Details (indicate cue type and reason): no AD Toileting- Clothing Manipulation and Hygiene: Minimal assistance;Sit to/from stand       Functional mobility during ADLs: Contact guard assist;Supervision/safety General ADL Comments: no AD, min guard fading to supervision     Vision   Vision Assessment?: No apparent visual deficits     Perception         Praxis         Pertinent Vitals/Pain Pain Assessment Pain Assessment: No/denies pain     Extremity/Trunk Assessment Upper Extremity Assessment Upper Extremity Assessment: Right hand dominant;Generalized weakness   Lower Extremity Assessment Lower Extremity Assessment: Defer to PT evaluation   Cervical / Trunk Assessment Cervical / Trunk Assessment: Normal   Communication Communication Communication: No apparent difficulties   Cognition Arousal: Alert Behavior  During Therapy: WFL for tasks assessed/performed Cognition: No apparent impairments             OT - Cognition Comments: appears WFL                 Following commands: Intact       Cueing  General Comments   Cueing Techniques: Verbal cues  spouse present and supportive,  encouraged spouse to allow pt to manage ADLs as able   Exercises     Shoulder Instructions      Home Living Family/patient expects to be discharged to:: Private residence Living Arrangements: Spouse/significant other Available Help at Discharge: Family;Available 24 hours/day Type of Home: House Home Access: Stairs to enter [employer] of Steps: 3 in garage, 7 front Entrance Stairs-Rails: Right Home Layout: Two level;Full bath on main level;Able to live on main level with bedroom/bathroom     Bathroom Shower/Tub: Tub only;Walk-in shower   Bathroom Toilet: Standard     Home Equipment: Shower seat - built in;Cane - single point;BSC/3in1   Additional Comments: corner shower chair      Prior Functioning/Environment Prior Level of Function : Independent/Modified Independent;Driving;Working/employed               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[employer], transportation for fire dpt    OT Problem List: Decreased strength;Decreased activity tolerance;Decreased knowledge of precautions;Decreased knowledge of use of DME or AE;Impaired balance (sitting and/or standing)   OT Treatment/Interventions: Self-care/ADL training;Therapeutic exercise;DME and/or AE instruction;Energy conservation;Therapeutic activities;Patient/family education;Balance training      OT Goals(Current goals can be found in the care plan section)   Acute Rehab OT Goals Patient Stated Goal: home OT Goal Formulation: With patient Time For Goal Achievement: 06/12/23 Potential to Achieve Goals: Good   OT Frequency:  Min 2X/week    Co-evaluation PT/OT/SLP Co-Evaluation/Treatment: Yes Reason for Co-Treatment: To address functional/ADL transfers;Other (comment) (tolerance)   OT goals addressed during session: ADL's and self-care      AM-PAC OT "6 Clicks" Daily Activity     Outcome Measure Help from another person eating meals?: A Little Help from another person taking care of personal  grooming?: A Little Help from another person toileting, which includes using toliet, bedpan, or urinal?: A Little Help from another person bathing (including washing, rinsing, drying)?: A Little Help from another person to put on and taking off regular upper body clothing?: A Little Help from another person to put on and taking off regular lower body clothing?: A Little 6 Click Score: 18   End of Session Equipment Utilized During Treatment: Gait belt Nurse Communication: Mobility status  Activity Tolerance: Patient tolerated treatment well Patient left: in chair;with call bell/phone within reach;with chair alarm set;with family/visitor present  OT Visit Diagnosis: Other abnormalities of gait and mobility (R26.89);Muscle weakness (generalized) (M62.81);History of falling (Z91.81)                Time: 1610-9604 OT Time Calculation (min): 23 min Charges:  OT General Charges $OT Visit: 1 Visit OT Evaluation $OT Eval Moderate Complexity: 1 Mod  Barry Brunner, OT Acute Rehabilitation Services Office [phone] Secure Chat Preferred     [name] 05/29/2023, 1:29 PM

## 2023-05-29 NOTE — Anesthesia Procedure Notes (Signed)
 Procedure Name: Intubation Date/Time: 05/29/2023 6:13 AM  Performed by: Chaniece Barbato T, CRNAPre-anesthesia Checklist: Patient identified, Emergency Drugs available, Suction available and Patient being monitored Patient Re-evaluated:Patient Re-evaluated prior to induction Oxygen Delivery Method: Circle system utilized Preoxygenation: Pre-oxygenation with 100% oxygen Induction Type: IV induction, Rapid sequence and Cricoid Pressure applied Ventilation: Mask ventilation without difficulty Laryngoscope Size: Mac and 4 Grade View: Grade I Tube type: Oral Tube size: 7.5 mm Number of attempts: 1 Airway Equipment and Method: Stylet and Oral airway Placement Confirmation: ETT inserted through vocal cords under direct vision, positive ETCO2 and breath sounds checked- equal and bilateral Secured at: 22 cm Tube secured with: Tape Dental Injury: Teeth and Oropharynx as per pre-operative assessment

## 2023-05-29 NOTE — Progress Notes (Signed)
 Patient arrived to unit via ED RN. RN received a call that patient was suppose to be transported OR. OR team RN and CRNA to transport patient down to PACU.   This RN was unable to complete assessment or complete set of vitals before patient left. This RN tubed informed consent forms and other paperwork to PACU.   Patients wife went down with patient.

## 2023-05-29 NOTE — ED Notes (Signed)
 Report called to [place] RN at [facility] ED.  All questions answered.

## 2023-05-29 NOTE — H&P (Addendum)
 History and Physical  Charles Strong MWN:[id number] DOB: 22-Nov-1940 DOA: 05/28/2023  Referring physician: Accepted by Dr. Sherrell Puller, hospitalist service. PCP: Lorenda Ishihara, MD  Outpatient Specialists: Cardiology, urology. Patient coming from: Home.  Chief Complaint: Generalized weakness.  HPI: Charles Strong is a 83 y.o. male with medical history significant for BPH, type 2 diabetes, chronic HFpEF, recently admitted on 05/15/2023 for left-sided pyelonephritis and discharged on 05/17/2023 who initially presented to [facility] ED due to generalized weakness, fall, and head injury.  In the ER, tachycardic with soft BPs.  Lab studies notable for leukocytosis greater than 34,000, lactic acidosis 2.9, elevated creatinine above baseline, UA positive for pyuria.  Code sepsis was called in the ER.  CT chest abdomen pelvis was ordered to look for other sources of infection.  It revealed 8 mm distal left ureteral stone with mild hydronephrosis, prostate enlargement, bladder wall thickening that may be related to chronic bladder outlet obstruction.  The patient received his first dose of IV antibiotics and IV fluid per sepsis protocol in the ER.  EDP discussed the case with urology who recommended admission by medicine team for further management.  Urology, Dr. Pete Glatter, planned for ureteral stent placement.  Admitted by [facility], hospitalist service.  Accepted by Dr. Antionette Char to progressive care unit, and transferred to [facility] ED.  At the time of this visit, the patient is hemodynamically stable.  Urine and peripheral blood cultures were obtained in the ER.  ED Course: Temperature 97.9.  BP 111/74, pulse 79, respiratory 19, O2 saturation 97% on room air.  Lab studies notable for WBC 34.4, neutrophil count 28.9, monocyte count 1.7, immature granulocyte 2.77.  Sodium 133, serum bicarb 21, serum glucose 253, BUN 25, creatinine 1.92, alkaline phosphatase 205, AST 45, GFR 35, lactic acid 2.9, 2.0.  Review of  Systems: Review of systems as noted in the HPI. All other systems reviewed and are negative.   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 He has never used smokeless tobacco. He reports that he does not drink alcohol and does not use drugs.   Allergies  Allergen Reactions   Calcium Fortified Cookie [Nutritional Supplements] Diarrhea   Egg-Derived Products Diarrhea   Metformin And Related Diarrhea    Family history: None reported.  Prior to Admission medications   Medication Sig Start Date End Date Taking? Authorizing Provider  acetaminophen (TYLENOL) 500 MG tablet Take 500 mg by mouth every 6 (six) hours as needed for mild pain (pain score 1-3), fever or headache.    [provider]  APPLE CIDER VINEGAR PO Take 30 mLs by mouth daily.     [provider]  Barberry-Oreg Grape-Goldenseal (BERBERINE COMPLEX PO) Take 2 capsules by mouth 2 (two) times daily.    [provider]  finasteride (PROSCAR) 5 MG tablet Take 5 mg by mouth daily. 11/20/17   [provider]  fluticasone (FLONASE) 50 MCG/ACT nasal spray PLACE 2 SPRAYS IN EACH NOSTRIL DAILY AS NEEDED FOR ALLERGIES 11/07/17   Chilton Si, MD  glipiZIDE (GLUCOTROL XL) 5 MG 24 hr tablet Take 5 mg by mouth daily.  12/06/19   [provider]  glucose blood test strip 1 each by Other route 3 (three) times daily. Use as instructed    [provider]  metFORMIN (GLUCOPHAGE-XR) 500 MG 24 hr tablet Take 500 mg by mouth daily with breakfast.    [provider]  NITROSTAT 0.4 MG SL tablet PLACE 1 TABLET UNDER THE TONGUE EVERY  5 MINUTES AS NEEDED FOR CHEST PAIN 01/22/16   Chilton Si, MD  Potassium 99 MG TABS Take 99 mg by mouth daily.    [provider]  Selenium 200 MCG TABS Take 200 mcg by mouth daily.    [provider]  silodosin (RAPAFLO) 8 MG CAPS capsule Take 8 mg by mouth daily with breakfast. 11/20/17   [provider]  thiamine (VITAMIN B-1) 100 MG tablet Take 100 mg by mouth daily.    [provider]  TURMERIC PO Take 400 mg by mouth daily.    [provider]  vitamin B-12 (CYANOCOBALAMIN) 100 MCG tablet Take 100 mcg by mouth daily.    [provider]    Physical Exam: BP 122/80 (BP Location: Right Arm)   Pulse 85   Temp 97.9 F (36.6 C) (Oral)   Resp 19   Ht 5\' 10"  (1.778 m)   Wt 63.5 kg   SpO2 95%   BMI 20.09 kg/m   General: 83 y.o. year-old male well developed well nourished in no acute distress.  Alert and oriented x3. Cardiovascular: Regular rate and rhythm with no rubs or gallops.  No thyromegaly or JVD noted.  No lower extremity edema. 2/4 pulses in all 4 extremities. Respiratory: Clear to auscultation with no wheezes or rales. Good inspiratory effort. Abdomen: Soft nontender nondistended with normal bowel sounds x4 quadrants. Muskuloskeletal: No cyanosis, clubbing or edema noted bilaterally Neuro: CN II-XII intact, strength, sensation, reflexes Skin: No ulcerative lesions noted or rashes Psychiatry: Judgement and insight appear normal. Mood is appropriate for condition and setting          Labs on Admission:  Basic Metabolic Panel: Recent Labs  Lab 05/28/23 1815  NA 133*  K 3.9  CL 98  CO2 21*  GLUCOSE 253*  BUN 25*  CREATININE 1.92*  CALCIUM 8.6*   Liver Function Tests: Recent Labs  Lab 05/28/23 1815  AST 45*  ALT 27  ALKPHOS 205*  BILITOT 1.2  PROT 6.7  ALBUMIN 3.2*   Recent Labs  Lab 05/28/23 1923  LIPASE 10*   No results for input(s): "AMMONIA" in the last 168 hours. CBC: Recent Labs  Lab  05/28/23 1815  WBC 34.4*  NEUTROABS 28.9*  HGB 13.7  HCT 39.6  MCV 84.3  PLT 163   Cardiac Enzymes: No results for input(s): "CKTOTAL", "CKMB", "CKMBINDEX", "TROPONINI" in the last 168 hours.  BNP (last 3 results) No results for input(s): "BNP" in the last 8760 hours.  ProBNP (last 3 results) No results for input(s): "PROBNP" in the last 8760 hours.  CBG: No results for input(s): "GLUCAP" in the last 168 hours.  Radiological Exams on Admission: CT ABDOMEN PELVIS W CONTRAST Result Date: 05/28/2023 CLINICAL DATA:  Sepsis Recent pyelonephritis.  Fall. EXAM: CT ABDOMEN AND PELVIS WITH CONTRAST TECHNIQUE: Multidetector CT imaging of the abdomen and pelvis was performed using the standard protocol following bolus administration of intravenous contrast. RADIATION DOSE REDUCTION: This exam was performed according to the departmental dose-optimization program which includes automated exposure control, adjustment of the mA and/or kV according to patient size and/or use  of iterative reconstruction technique. CONTRAST:  80mL OMNIPAQUE IOHEXOL 300 MG/ML  SOLN COMPARISON:  05/15/2023 FINDINGS: Lower chest: No acute abnormality Hepatobiliary: Small 1-2 mm layering gallstone, stable. No wall thickening or biliary ductal dilatation. No focal hepatic abnormality. Pancreas: No focal abnormality or ductal dilatation. Spleen: No focal abnormality.  Normal size. Adrenals/Urinary Tract: Adrenal glands normal. Previously seen left lower pole renal stone has migrated and is now in the distal left ureter measuring 8 mm. Mild left hydronephrosis. No definite current signs of pyelonephritis. No stones or hydronephrosis on the right. Urinary bladder appears thick walled, partially decompressed. Stomach/Bowel: Sigmoid diverticulosis. No active diverticulitis. Stomach and small bowel decompressed, unremarkable. Vascular/Lymphatic: Aortic atherosclerosis. No evidence of aneurysm or adenopathy. Reproductive: Prostate  enlargement. Other: No free fluid or free air. Musculoskeletal: No acute bony abnormality. IMPRESSION: Migration of the previously seen left lower pole renal stone into the left ureter. 8 mm stone in the distal left ureter of mild left hydronephrosis. Sigmoid diverticulosis. Aortic atherosclerosis. Prostate enlargement. Bladder wall thickening may be related to chronic bladder outlet obstruction. Electronically Signed   By: Charlett Nose M.D.   On: 05/28/2023 19:56   CT Maxillofacial WO CM Result Date: 05/28/2023 CLINICAL DATA:  Bruising around nose from fall. EXAM: CT MAXILLOFACIAL WITHOUT CONTRAST TECHNIQUE: Multidetector CT imaging of the maxillofacial structures was performed. Multiplanar CT image reconstructions were also generated. RADIATION DOSE REDUCTION: This exam was performed according to the departmental dose-optimization program which includes automated exposure control, adjustment of the mA and/or kV according to patient size and/or use of iterative reconstruction technique. COMPARISON:  None Available. FINDINGS: Osseous: Fractures noted through the nasal bones, minimally displaced. 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 Mandible and zygomatic arches intact. Orbits: No fracture or orbital emphysema.  Globes intact. Sinuses: Clear Soft tissues: Negative Limited intracranial: See head CT report IMPRESSION: Minimally displaced nasal bone fractures. Electronically Signed   By: Charlett Nose M.D.   On: 05/28/2023 19:52   CT Cervical Spine Wo Contrast Result Date: 05/28/2023 CLINICAL DATA:  Fall EXAM: CT CERVICAL SPINE WITHOUT CONTRAST TECHNIQUE: Multidetector CT imaging of the cervical spine was performed without intravenous contrast. Multiplanar CT image reconstructions were also generated. RADIATION DOSE REDUCTION: This exam was performed according to the departmental dose-optimization program which includes automated exposure control, adjustment of the mA and/or  kV according to patient size and/or use of iterative reconstruction technique. COMPARISON:  None Available. FINDINGS: Alignment: Normal. Skull base and vertebrae: No acute fracture. No primary bone lesion or focal pathologic process. Soft tissues and spinal canal: No prevertebral fluid or swelling. No visible canal hematoma. Disc levels: Diffuse mild degenerative disc disease with disc space narrowing and spurring. Mild bilateral degenerative facet disease, left greater than right. Upper chest: No acute findings. Calcified granuloma in the left upper lobe. Other: None IMPRESSION: Degenerative disc and facet disease.  No acute bony abnormality. Electronically Signed   By: Charlett Nose M.D.   On: 05/28/2023 19:50   CT Head Wo Contrast Result Date: 05/28/2023 CLINICAL DATA:  Fall EXAM: CT HEAD WITHOUT CONTRAST TECHNIQUE: Contiguous axial images were obtained from the base of the skull through the vertex without intravenous contrast. RADIATION DOSE REDUCTION: This exam was performed according to the departmental dose-optimization program which includes automated exposure control, adjustment of the mA and/or kV according to patient size and/or use of iterative reconstruction technique. COMPARISON:  None Available. FINDINGS: Brain: No acute intracranial abnormality. Specifically, no hemorrhage, hydrocephalus,  mass lesion, acute infarction, or significant intracranial injury. Vascular: No hyperdense vessel or unexpected calcification. Skull: No acute calvarial abnormality. Sinuses/Orbits: No acute findings Other: None IMPRESSION: No acute intracranial abnormality. Electronically Signed   By: Charlett Nose M.D.   On: 05/28/2023 19:48   DG Chest 1 View Result Date: 05/28/2023 CLINICAL DATA:  Weakness and sepsis. EXAM: CHEST  1 VIEW COMPARISON:  05/15/2023 FINDINGS: Stable cardiomediastinal silhouette. Aortic atherosclerotic calcification. No focal consolidation, pleural effusion, or pneumothorax. No displaced rib  fractures. IMPRESSION: No acute cardiopulmonary disease. Electronically Signed   By: Minerva Fester M.D.   On: 05/28/2023 19:42    EKG: I independently viewed the EKG done and my findings are as followed: Sinus tachycardia rate of 125, QTc 449.  Assessment/Plan Present on Admission:  Ureteral calculus, left  Sepsis with acute renal failure and septic shock (HCC)  Sepsis (HCC)  Principal Problem:   Ureteral calculus, left Active Problems:   Sepsis with acute renal failure and septic shock (HCC)   Acute cystitis with hematuria   Sepsis (HCC)  Sepsis secondary to complicated UTI in the setting of distal left ureteral kidney stone, POA Presented with leukocytosis greater than 34K, lactic acidosis 2.9, UA positive for pyuria Code sepsis called in the ER Continue management per sepsis protocol IV Zosyn, IV fluid Monitor fever curve and WBCs Trend lactic acid Maintain MAP greater than 65 Seen by urology for possible ureteral stent placement N.p.o. until cleared by urology to eat Follow urine culture and peripheral blood cultures x 2  AKI, suspect multifactorial, from chronic bladder outlet obstruction in the setting of BPH, and sepsis. Continue IV fluid hydration per sepsis protocol Avoid nephrotoxic agents, dehydration, and hypotension Monitor urine output with strict I's and O's Monitor for urinary retention. Repeat BMP in the morning  Mild non-anion gap metabolic acidosis in the setting of acute renal insufficiency Presented with serum bicarb of 21 and anion gap of 14 Continue IV fluid hydration Repeat BMP in the morning  Elevated liver chemistries, unclear etiology Alkaline phosphatase 205, AST 45 Avoid hepatotoxic agents, repeat CMP in the morning  Type 2 diabetes with hyperglycemia Last hemoglobin A1c 7.3 on 05/16/2023 Presented with serum glucose 253 Currently n.p.o. due to possible urology procedure on 05/29/23. Insulin sliding scale every 4 hours while  NPO  BPH Defer management per urology Monitor urine output Monitor for acute urinary retention  Chronic HFpEF Euvolemic on exam Last 2D echo done on 05/16/2023 revealed LVEF 60-65% and grade 1 diastolic dysfunction. Closely monitor volume status while on IV fluid hydration Monitor strict I's and O's and daily weight.  Generalized weakness, suspect contributed by sepsis Continue to treat underlying conditions PT OT assessment Fall precautions  Recent fall with head trauma CT maxillo facial without contrast revealed minimally displaced nasal bone fractures No acute intracranial abnormality on CT head without contrast.   Degenerative disc and facet disease.  No acute bony abnormality seen on CT cervical spine without contrast. Continue fall precautions and as needed analgesics    Critical care time: 65 minutes.    DVT prophylaxis: SCDs.  Defer chemical DVT prophylaxis to urology post procedure.  Code Status: Full code.  Family Communication: None at bedside.  Disposition Plan: Admitted to progressive care unit.  Consults called: Urology.  Admission status: Inpatient status.   Status is: Inpatient The patient requires at least 2 midnights for further evaluation and treatment of present condition.   Darlin Drop MD [facility] Pager [phone]  If 7PM-7AM,  please contact night-coverage [email or web address] Password [email or web address]  05/29/2023, 4:25 AM

## 2023-05-29 NOTE — H&P (View-Only) (Signed)
 Urology Consult   Physician requesting consult: Evlyn Kanner, PA-C  Reason for consult: Ureteral calculus, sepsis  History of Present Illness: Charles Strong is a 83 y.o. male who presented to the emergency room last night with dehydration and weakness.  He was recently admitted to the hospital for AKI and suspected pyelonephritis.  Since his discharge on 05/17/2023 he has not been doing well.  He reports a fever to 100 degrees at home and chills.  He also has had left flank and abdominal pain, intermittent in nature.  He was discharged on cefadroxil which he completed.  Urine culture from 05/15/2023 showed no growth. Evaluation in the emergency room demonstrated a white blood cell count of 34.4, lactate of 2.9, and creatinine of 1.92.  Urinalysis showed >50 RBCs, >50 WBCs, and many bacteria. CT abdomen and pelvis with contrast showed an 8 mm calculus in the distal left ureter with mild left hydronephrosis, thick-walled bladder, and enlarged prostate. He has been hypotensive and tachycardic in the emergency room.  He received IV Zosyn as well as IV fluids.  He has a history of BPH and has been managed with silodosin and finasteride. He is followed by Dr. Mena Goes.  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  Allergies:  Allergies  Allergen Reactions   Calcium Fortified Cookie [Nutritional Supplements] Diarrhea   Egg-Derived Products Diarrhea   Metformin And Related Diarrhea    History reviewed. No pertinent family history.  Social  History:  reports that he has never smoked. He has never used smokeless tobacco. He reports that he does not drink alcohol and does not use drugs.  ROS: A complete review of systems was performed.  All systems are negative except for pertinent findings as noted.  Physical Exam:  Vital signs in last 24 hours: Temp:  [97.6 F (36.4 C)-97.8 F (36.6 C)] 97.8 F (36.6 C) (04/03 0306) Pulse Rate:  [66-131] 71 (04/03 0300) Resp:  [17-28] 19 (04/03 0300) BP: (81-118)/(52-83) 111/71 (04/03 0300) SpO2:  [94 %-99 %] 96 % (04/03 0300) Weight:  [63.5 kg] 63.5 kg (04/02 1737) GENERAL APPEARANCE:  Well appearing, well developed, well nourished, NAD HEENT:  Atraumatic, normocephalic, oropharynx clear NECK:  Supple without lymphadenopathy or thyromegaly ABDOMEN:  Soft, non-tender, no masses EXTREMITIES:  Moves all extremities well, without clubbing, cyanosis, or edema NEUROLOGIC:  Alert and oriented x 3, normal gait, CN II-XII grossly intact MENTAL STATUS:  appropriate BACK:  Non-tender to palpation, No CVAT SKIN:  Warm, dry, and intact  Laboratory Data:  Recent Labs    05/28/23 1815  WBC 34.4*  HGB 13.7  HCT 39.6  PLT 163    Recent Labs    05/28/23 1815  NA 133*  K 3.9  CL 98  GLUCOSE 253*  BUN 25*  CALCIUM 8.6*  CREATININE 1.92*     Results for orders placed or performed during the hospital encounter of 05/28/23 (from the past 24 hours)  Culture, blood (Routine x 2)     Status: None (Preliminary  result)   Collection Time: 05/28/23  5:39 PM   Specimen: BLOOD RIGHT FOREARM  Result Value Ref Range   Specimen Description      BLOOD RIGHT FOREARM Performed at [facility], [address], [place], [place] [address]    Special Requests      BOTTLES DRAWN AEROBIC AND ANAEROBIC Blood Culture adequate volume Performed at [facility], [address] [place], [place] [address]    Culture PENDING    Report Status PENDING   Culture, blood (Routine x 2)      Status: None (Preliminary result)   Collection Time: 05/28/23  5:50 PM   Specimen: BLOOD RIGHT WRIST  Result Value Ref Range   Specimen Description      BLOOD RIGHT WRIST Performed at [facility], [address], [place], [place] [address]    Special Requests      BOTTLES DRAWN AEROBIC AND ANAEROBIC Blood Culture adequate volume Performed at [facility], [address] [place], [place] [address]    Culture PENDING    Report Status PENDING   Comprehensive metabolic panel     Status: Abnormal   Collection Time: 05/28/23  6:15 PM  Result Value Ref Range   Sodium 133 (L) 135 - 145 mmol/L   Potassium 3.9 3.5 - 5.1 mmol/L   Chloride 98 98 - 111 mmol/L   CO2 21 (L) 22 - 32 mmol/L   Glucose, Bld 253 (H) 70 - 99 mg/dL   BUN 25 (H) 8 - 23 mg/dL   Creatinine, Ser 5.36 (H) 0.61 - 1.24 mg/dL   Calcium 8.6 (L) 8.9 - 10.3 mg/dL   Total Protein 6.7 6.5 - 8.1 g/dL   Albumin 3.2 (L) 3.5 - 5.0 g/dL   AST 45 (H) 15 - 41 U/L   ALT 27 0 - 44 U/L   Alkaline Phosphatase 205 (H) 38 - 126 U/L   Total Bilirubin 1.2 0.0 - 1.2 mg/dL   GFR, Estimated 35 (L) >60 mL/min   Anion gap 14 5 - 15  Lactic acid, plasma     Status: Abnormal   Collection Time: 05/28/23  6:15 PM  Result Value Ref Range   Lactic Acid, Venous 2.9 (HH) 0.5 - 1.9 mmol/L  CBC with Differential     Status: Abnormal   Collection Time: 05/28/23  6:15 PM  Result Value Ref Range   WBC 34.4 (H) 4.0 - 10.5 K/uL   RBC 4.70 4.22 - 5.81 MIL/uL   Hemoglobin 13.7 13.0 - 17.0 g/dL   HCT 64.4 03.4 - 74.2 %   MCV 84.3 80.0 - 100.0 fL   MCH 29.1 26.0 - 34.0 pg   MCHC 34.6 30.0 - 36.0 g/dL   RDW 59.5 63.8 - 75.6 %   Platelets 163 150 - 400 K/uL   nRBC 0.0 0.0 - 0.2 %   Neutrophils Relative % 84 %   Neutro Abs 28.9 (H) 1.7 - 7.7 K/uL   Lymphocytes Relative 2 %   Lymphs Abs 0.8 0.7 - 4.0 K/uL   Monocytes Relative 5 %   Monocytes Absolute 1.7 (H) 0.1 - 1.0 K/uL   Eosinophils Relative 1 %   Eosinophils Absolute  0.2 0.0 - 0.5 K/uL   Basophils Relative 0 %   Basophils Absolute 0.1 0.0 - 0.1 K/uL   Immature Granulocytes 8 %   Abs Immature Granulocytes 2.77 (H) 0.00 - 0.07 K/uL  Protime-INR     Status: None   Collection  Time: 05/28/23  6:15 PM  Result Value Ref Range   Prothrombin Time 15.0 11.4 - 15.2 seconds   INR 1.2 0.8 - 1.2  Resp panel by RT-PCR (RSV, Flu A&B, Covid)     Status: None   Collection Time: 05/28/23  6:15 PM   Specimen: Nasal Swab  Result Value Ref Range   SARS Coronavirus 2 by RT PCR NEGATIVE NEGATIVE   Influenza A by PCR NEGATIVE NEGATIVE   Influenza B by PCR NEGATIVE NEGATIVE   Resp Syncytial Virus by PCR NEGATIVE NEGATIVE  APTT     Status: None   Collection Time: 05/28/23  6:15 PM  Result Value Ref Range   aPTT 35 24 - 36 seconds  TSH     Status: Abnormal   Collection Time: 05/28/23  6:15 PM  Result Value Ref Range   TSH 9.920 (H) 0.350 - 4.500 uIU/mL  Urinalysis, w/ Reflex to Culture (Infection Suspected) -Urine, Clean Catch     Status: Abnormal   Collection Time: 05/28/23  7:12 PM  Result Value Ref Range   Specimen Source URINE, CATHETERIZED    Color, Urine ORANGE (A) YELLOW   APPearance CLOUDY (A) CLEAR   Specific Gravity, Urine 1.025 1.005 - 1.030   pH 5.5 5.0 - 8.0   Glucose, UA NEGATIVE NEGATIVE mg/dL   Hgb urine dipstick LARGE (A) NEGATIVE   Bilirubin Urine NEGATIVE NEGATIVE   Ketones, ur 15 (A) NEGATIVE mg/dL   Protein, ur 161 (A) NEGATIVE mg/dL   Nitrite NEGATIVE NEGATIVE   Leukocytes,Ua LARGE (A) NEGATIVE   RBC / HPF >50 0 - 5 RBC/hpf   WBC, UA >50 0 - 5 WBC/hpf   Bacteria, UA MANY (A) NONE SEEN   Squamous Epithelial / HPF 0-5 0 - 5 /HPF   WBC Clumps PRESENT    Mucus PRESENT    Hyphae Yeast PRESENT   Lipase, blood     Status: Abnormal   Collection Time: 05/28/23  7:23 PM  Result Value Ref Range   Lipase 10 (L) 11 - 51 U/L  Lactic acid, plasma     Status: Abnormal   Collection Time: 05/28/23  8:52 PM  Result Value Ref Range   Lactic Acid,  Venous 2.0 (HH) 0.5 - 1.9 mmol/L   Recent Results (from the past 240 hours)  Culture, blood (Routine x 2)     Status: None (Preliminary result)   Collection Time: 05/28/23  5:39 PM   Specimen: BLOOD RIGHT FOREARM  Result Value Ref Range Status   Specimen Description   Final    BLOOD RIGHT FOREARM Performed at [facility], [address], [place], [place] [address]    Special Requests   Final    BOTTLES DRAWN AEROBIC AND ANAEROBIC Blood Culture adequate volume Performed at [facility], [address], [place], [place] [address]    Culture PENDING  Incomplete   Report Status PENDING  Incomplete  Culture, blood (Routine x 2)     Status: None (Preliminary result)   Collection Time: 05/28/23  5:50 PM   Specimen: BLOOD RIGHT WRIST  Result Value Ref Range Status   Specimen Description   Final    BLOOD RIGHT WRIST Performed at [facility], [address], [place], [place] [address]    Special Requests   Final    BOTTLES DRAWN AEROBIC AND ANAEROBIC Blood Culture adequate volume Performed at [facility], [address] [place], [place] [address]    Culture PENDING  Incomplete   Report Status PENDING  Incomplete  Resp panel by RT-PCR (RSV, Flu A&B, Covid)     Status: None   Collection Time: 05/28/23  6:15 PM   Specimen: Nasal Swab  Result Value Ref Range Status   SARS Coronavirus 2 by RT PCR NEGATIVE NEGATIVE Final    Comment: (NOTE) SARS-CoV-2 target nucleic acids are NOT DETECTED.  The SARS-CoV-2 RNA is generally detectable in upper respiratory specimens during the acute phase of infection. The lowest concentration of SARS-CoV-2 viral copies this assay can detect is 138 copies/mL. A negative result does not preclude SARS-Cov-2 infection and should not be used as the sole basis for treatment or other patient management decisions. A negative result may occur with  improper specimen collection/handling, submission of specimen  other than nasopharyngeal swab, presence of viral mutation(s) within the areas targeted by this assay, and inadequate number of viral copies(<138 copies/mL). A negative result must be combined with clinical observations, patient history, and epidemiological information. The expected result is Negative.  Fact Sheet for Patients:  BloggerCourse.com  Fact Sheet for Healthcare Providers:  SeriousBroker.it  This test is no t yet approved or cleared by the [place] FDA and  has been authorized for detection and/or diagnosis of SARS-CoV-2 by FDA under an Emergency Use Authorization (EUA). This EUA will remain  in effect (meaning this test can be used) for the duration of the COVID-19 declaration under Section 564(b)(1) of the Act, 21 U.S.C.section 360bbb-3(b)(1), unless the authorization is terminated  or revoked sooner.       Influenza A by PCR NEGATIVE NEGATIVE Final   Influenza B by PCR NEGATIVE NEGATIVE Final    Comment: (NOTE) The Xpert Xpress SARS-CoV-2/FLU/RSV plus assay is intended as an aid in the diagnosis of influenza from Nasopharyngeal swab specimens and should not be used as a sole basis for treatment. Nasal washings and aspirates are unacceptable for Xpert Xpress SARS-CoV-2/FLU/RSV testing.  Fact Sheet for Patients: BloggerCourse.com  Fact Sheet for Healthcare Providers: SeriousBroker.it  This test is not yet approved or cleared by the [place] FDA and has been authorized for detection and/or diagnosis of SARS-CoV-2 by FDA under an Emergency Use Authorization (EUA). This EUA will remain in effect (meaning this test can be used) for the duration of the COVID-19 declaration under Section 564(b)(1) of the Act, 21 U.S.C. section 360bbb-3(b)(1), unless the authorization is terminated or revoked.     Resp Syncytial Virus by PCR NEGATIVE NEGATIVE Final     Comment: (NOTE) Fact Sheet for Patients: BloggerCourse.com  Fact Sheet for Healthcare Providers: SeriousBroker.it  This test is not yet approved or cleared by the [place] FDA and has been authorized for detection and/or diagnosis of SARS-CoV-2 by FDA under an Emergency Use Authorization (EUA). This EUA will remain in effect (meaning this test can be used) for the duration of the COVID-19 declaration under Section 564(b)(1) of the Act, 21 U.S.C. section 360bbb-3(b)(1), unless the authorization is terminated or revoked.  Performed at [facility], [address] [place], [place] [address]     Renal Function: Recent Labs    05/28/23 1815  CREATININE 1.92*   Estimated Creatinine Clearance: 27.6 mL/min (A) (by C-G formula based on SCr of 1.92 mg/dL (H)).  Radiologic Imaging: CT ABDOMEN PELVIS W CONTRAST Result Date: 05/28/2023 CLINICAL DATA:  Sepsis Recent pyelonephritis.  Fall. EXAM: CT ABDOMEN AND PELVIS WITH CONTRAST TECHNIQUE: Multidetector CT imaging of the abdomen and pelvis was performed using the standard protocol following bolus  administration of intravenous contrast. RADIATION DOSE REDUCTION: This exam was performed according to the departmental dose-optimization program which includes automated exposure control, adjustment of the mA and/or kV according to patient size and/or use of iterative reconstruction technique. CONTRAST:  80mL OMNIPAQUE IOHEXOL 300 MG/ML  SOLN COMPARISON:  05/15/2023 FINDINGS: Lower chest: No acute abnormality Hepatobiliary: Small 1-2 mm layering gallstone, stable. No wall thickening or biliary ductal dilatation. No focal hepatic abnormality. Pancreas: No focal abnormality or ductal dilatation. Spleen: No focal abnormality.  Normal size. Adrenals/Urinary Tract: Adrenal glands normal. Previously seen left lower pole renal stone has migrated and is now in the distal left ureter  measuring 8 mm. Mild left hydronephrosis. No definite current signs of pyelonephritis. No stones or hydronephrosis on the right. Urinary bladder appears thick walled, partially decompressed. Stomach/Bowel: Sigmoid diverticulosis. No active diverticulitis. Stomach and small bowel decompressed, unremarkable. Vascular/Lymphatic: Aortic atherosclerosis. No evidence of aneurysm or adenopathy. Reproductive: Prostate enlargement. Other: No free fluid or free air. Musculoskeletal: No acute bony abnormality. IMPRESSION: Migration of the previously seen left lower pole renal stone into the left ureter. 8 mm stone in the distal left ureter of mild left hydronephrosis. Sigmoid diverticulosis. Aortic atherosclerosis. Prostate enlargement. Bladder wall thickening may be related to chronic bladder outlet obstruction. Electronically Signed   By: Charlett Nose M.D.   On: 05/28/2023 19:56   CT Maxillofacial WO CM Result Date: 05/28/2023 CLINICAL DATA:  Bruising around nose from fall. EXAM: CT MAXILLOFACIAL WITHOUT CONTRAST TECHNIQUE: Multidetector CT imaging of the maxillofacial structures was performed. Multiplanar CT image reconstructions were also generated. RADIATION DOSE REDUCTION: This exam was performed according to the departmental dose-optimization program which includes automated exposure control, adjustment of the mA and/or kV according to patient size and/or use of iterative reconstruction technique. COMPARISON:  None Available. FINDINGS: Osseous: Fractures noted through the nasal bones, minimally displaced. 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 Mandible and zygomatic arches intact. Orbits: No fracture or orbital emphysema.  Globes intact. Sinuses: Clear Soft tissues: Negative Limited intracranial: See head CT report IMPRESSION: Minimally displaced nasal bone fractures. Electronically Signed   By: Charlett Nose M.D.   On: 05/28/2023 19:52   CT Cervical Spine Wo Contrast Result  Date: 05/28/2023 CLINICAL DATA:  Fall EXAM: CT CERVICAL SPINE WITHOUT CONTRAST TECHNIQUE: Multidetector CT imaging of the cervical spine was performed without intravenous contrast. Multiplanar CT image reconstructions were also generated. RADIATION DOSE REDUCTION: This exam was performed according to the departmental dose-optimization program which includes automated exposure control, adjustment of the mA and/or kV according to patient size and/or use of iterative reconstruction technique. COMPARISON:  None Available. FINDINGS: Alignment: Normal. Skull base and vertebrae: No acute fracture. No primary bone lesion or focal pathologic process. Soft tissues and spinal canal: No prevertebral fluid or swelling. No visible canal hematoma. Disc levels: Diffuse mild degenerative disc disease with disc space narrowing and spurring. Mild bilateral degenerative facet disease, left greater than right. Upper chest: No acute findings. Calcified granuloma in the left upper lobe. Other: None IMPRESSION: Degenerative disc and facet disease.  No acute bony abnormality. Electronically Signed   By: Charlett Nose M.D.   On: 05/28/2023 19:50   CT Head Wo Contrast Result Date: 05/28/2023 CLINICAL DATA:  Fall EXAM: CT HEAD WITHOUT CONTRAST TECHNIQUE: Contiguous axial images were obtained from the base of the skull through the vertex without intravenous contrast. RADIATION DOSE REDUCTION: This exam was performed according to the departmental dose-optimization program which  includes automated exposure control, adjustment of the mA and/or kV according to patient size and/or use of iterative reconstruction technique. COMPARISON:  None Available. FINDINGS: Brain: No acute intracranial abnormality. Specifically, no hemorrhage, hydrocephalus, mass lesion, acute infarction, or significant intracranial injury. Vascular: No hyperdense vessel or unexpected calcification. Skull: No acute calvarial abnormality. Sinuses/Orbits: No acute findings Other:  None IMPRESSION: No acute intracranial abnormality. Electronically Signed   By: Charlett Nose M.D.   On: 05/28/2023 19:48   DG Chest 1 View Result Date: 05/28/2023 CLINICAL DATA:  Weakness and sepsis. EXAM: CHEST  1 VIEW COMPARISON:  05/15/2023 FINDINGS: Stable cardiomediastinal silhouette. Aortic atherosclerotic calcification. No focal consolidation, pleural effusion, or pneumothorax. No displaced rib fractures. IMPRESSION: No acute cardiopulmonary disease. Electronically Signed   By: Minerva Fester M.D.   On: 05/28/2023 19:42    I independently reviewed the above imaging studies.  Impression/Recommendation Left ureteral calculus Sepsis secondary to UTI AKI  The patient has an obstructing left ureteral calculus with associated UTI and sepsis as well as AKI. Recommend urgent management with placement of a left ureteral stent. Patient will be taken to the operating room for cystoscopy, left retrograde pyelogram, and insertion of left ureteral stent.  The procedure including potential risk was discussed with the patient in detail.  I also discussed the possible need for a percutaneous nephrostomy tube should stent insertion be unsuccessful. He will eventually need definitive management of the ureteral calculus once his UTI has been completely treated.  Elige Radon Rainer Mounce 05/29/2023, 3:09 AM

## 2023-05-30 ENCOUNTER — Encounter (HOSPITAL_COMMUNITY): Payer: Self-pay | Admitting: Urology

## 2023-05-30 DIAGNOSIS — A419 Sepsis, unspecified organism: Secondary | ICD-10-CM | POA: Diagnosis not present

## 2023-05-30 DIAGNOSIS — N201 Calculus of ureter: Secondary | ICD-10-CM | POA: Diagnosis not present

## 2023-05-30 DIAGNOSIS — N3001 Acute cystitis with hematuria: Secondary | ICD-10-CM | POA: Diagnosis not present

## 2023-05-30 DIAGNOSIS — R6521 Severe sepsis with septic shock: Secondary | ICD-10-CM | POA: Diagnosis not present

## 2023-05-30 LAB — COMPREHENSIVE METABOLIC PANEL WITH GFR
ALT: 21 U/L (ref 0–44)
AST: 19 U/L (ref 15–41)
Albumin: 2 g/dL — ABNORMAL LOW (ref 3.5–5.0)
Alkaline Phosphatase: 117 U/L (ref 38–126)
Anion gap: 13 (ref 5–15)
BUN: 25 mg/dL — ABNORMAL HIGH (ref 8–23)
CO2: 22 mmol/L (ref 22–32)
Calcium: 7.7 mg/dL — ABNORMAL LOW (ref 8.9–10.3)
Chloride: 99 mmol/L (ref 98–111)
Creatinine, Ser: 1.07 mg/dL (ref 0.61–1.24)
GFR, Estimated: >60 mL/min (ref >60)
Glucose, Bld: 251 mg/dL — ABNORMAL HIGH (ref 70–99)
Potassium: 3.8 mmol/L (ref 3.5–5.1)
Sodium: 134 mmol/L — ABNORMAL LOW (ref 135–145)
Total Bilirubin: 0.6 mg/dL (ref 0.0–1.2)
Total Protein: 5.1 g/dL — ABNORMAL LOW (ref 6.5–8.1)

## 2023-05-30 LAB — URINE CULTURE: Culture: >=100000 — AB

## 2023-05-30 LAB — CBC
HCT: 31.6 % — ABNORMAL LOW (ref 39.0–52.0)
Hemoglobin: 10.5 g/dL — ABNORMAL LOW (ref 13.0–17.0)
MCH: 28.3 pg (ref 26.0–34.0)
MCHC: 33.2 g/dL (ref 30.0–36.0)
MCV: 85.2 fL (ref 80.0–100.0)
Platelets: 169 10*3/uL (ref 150–400)
RBC: 3.71 MIL/uL — ABNORMAL LOW (ref 4.22–5.81)
RDW: 14.4 % (ref 11.5–15.5)
WBC: 21 10*3/uL — ABNORMAL HIGH (ref 4.0–10.5)
nRBC: 0 % (ref 0.0–0.2)

## 2023-05-30 LAB — GLUCOSE, CAPILLARY
Glucose-Capillary: 263 mg/dL — ABNORMAL HIGH (ref 70–99)
Glucose-Capillary: 267 mg/dL — ABNORMAL HIGH (ref 70–99)
Glucose-Capillary: 157 mg/dL — ABNORMAL HIGH (ref 70–99)
Glucose-Capillary: 143 mg/dL — ABNORMAL HIGH (ref 70–99)

## 2023-05-30 MED ORDER — CHLORHEXIDINE GLUCONATE CLOTH 2 % EX PADS
6.0000 | MEDICATED_PAD | Freq: Every day | CUTANEOUS | Status: DC
  Administered 2023-05-30 – 2023-05-31 (×2): 6 via TOPICAL

## 2023-05-30 MED ORDER — FENTANYL CITRATE PF 50 MCG/ML IJ SOSY
25.0000 ug | PREFILLED_SYRINGE | INTRAMUSCULAR | Status: DC | PRN
Start: 2023-05-30 — End: 2023-06-01
  Administered 2023-05-30: 50 ug via INTRAVENOUS
  Filled 2023-05-30: qty 1

## 2023-05-30 NOTE — Plan of Care (Signed)
  Problem: Fluid Volume: Goal: Ability to maintain a balanced intake and output will improve Outcome: Progressing   Problem: Metabolic: Goal: Ability to maintain appropriate glucose levels will improve Outcome: Progressing   Problem: Clinical Measurements: Goal: Ability to maintain clinical measurements within normal limits will improve Outcome: Progressing Goal: Will remain free from infection Outcome: Progressing   Problem: Elimination: Goal: Will not experience complications related to urinary retention Outcome: Progressing   Problem: Safety: Goal: Ability to remain free from injury will improve Outcome: Progressing

## 2023-05-30 NOTE — Progress Notes (Signed)
 Physical Therapy Treatment Patient Details Name: Charles Strong MRN: [id number] DOB: 09-16-1940 Today's Date: 05/30/2023   History of Present Illness Pt is a 83 y/o male presenting on 4/2 with generalized weakness. Found with sepsis secondary to complicated UTI in setting of L ureteral kidney stone. 4/3 s/p cystoscopy, L retrograde pyelogram and insertion lf L ureteral stent. PMH includes: DM 2, recent hospital stay 3/20-22 with fall and nasal fx.    PT Comments  Session focused on gait training to promote safe ambulation, stair navigation, and narrow BOS walking. Pt continues to display improvements in activity tolerance as he walked >400 ft and navigated 12 steps without needing a break. While the pt is improving, he still has difficulty with narrow BOS walking and narrow positions as he increases his frontal plane sway. Pt education and HEP were provided to continue addressing dynamic narrow BOS and balance deficits to which pt and family responded with verbal understanding. Pt's wife was instructed on safe guarding techniques for ambulation and stair climbing and responded with verbal understanding. Pt would benefit from continued mobility with nurses and mobility specialists until d/c. Will continue to follow acutely.      If plan is discharge home, recommend the following: A little help with walking and/or transfers;A little help with bathing/dressing/bathroom;Assistance with cooking/housework;Assist for transportation;Help with stairs or ramp for entrance   Can travel by private vehicle        Equipment Recommendations  None recommended by PT    Recommendations for Other Services       Precautions / Restrictions Precautions Precautions: Fall Recall of Precautions/Restrictions: Intact Precaution/Restrictions Comments: foley Restrictions Weight Bearing Restrictions Per Provider Order: No     Mobility  Bed Mobility Overal bed mobility: Needs Assistance Bed Mobility: Supine to  Sit     Supine to sit: Supervision, HOB elevated     General bed mobility comments: pt required HOB elevation  and increased time, particularly to bring hips closer to the EOB    Transfers Overall transfer level: Needs assistance Equipment used: None Transfers: Sit to/from Stand Sit to Stand: Supervision           General transfer comment: pt places one UE on the end bed rail and the other on his knee as he pushes to stand. Stands with increased BOS, supervision for safety    Ambulation/Gait Ambulation/Gait assistance: Supervision, Contact guard assist Gait Distance (Feet): 450 Feet Assistive device: None Gait Pattern/deviations: Step-through pattern, Wide base of support, Decreased stride length Gait velocity: normal Gait velocity interpretation: >4.37 ft/sec, indicative of normal walking speed   General Gait Details: pt walks with increased out-toeing bil. supervision for normal gait, CGA for narrow BOS and tandem walking as pt increased his frontal plane sway.   Stairs Stairs: Yes Stairs assistance: Contact guard assist Stair Management: One rail Right, One rail Left, Alternating pattern, Forwards Number of Stairs: 12 General stair comments: pt performs stairs with alternating pattern, normal speed, and good eccentric control. CGA for safety   Wheelchair Mobility     Tilt Bed    Modified Rankin (Stroke Patients Only)       Balance Overall balance assessment: Needs assistance Sitting-balance support: No upper extremity supported, Feet supported Sitting balance-Leahy Scale: Good Sitting balance - Comments: pt sits EOB with increased trunk flexion.   Standing balance support: During functional activity, No upper extremity supported Standing balance-Leahy Scale: Fair Standing balance comment: CGA for safety. pt stands with increased BOS and out-toeing. No sway noted with  static standing balance                            Communication  Communication Communication: No apparent difficulties  Cognition Arousal: Alert Behavior During Therapy: WFL for tasks assessed/performed   PT - Cognitive impairments: No apparent impairments                         Following commands: Intact      Cueing Cueing Techniques: Verbal cues, Visual cues  Exercises      General Comments        Pertinent Vitals/Pain Pain Assessment Pain Assessment: No/denies pain    Home Living                          Prior Function            PT Goals (current goals can now be found in the care plan section) Acute Rehab PT Goals Patient Stated Goal: return home PT Goal Formulation: With patient/family Time For Goal Achievement: 06/12/23 Potential to Achieve Goals: Good Progress towards PT goals: Progressing toward goals    Frequency    Min 2X/week      PT Plan      Co-evaluation              AM-PAC PT "6 Clicks" Mobility   Outcome Measure  Help needed turning from your back to your side while in a flat bed without using bedrails?: A Little Help needed moving from lying on your back to sitting on the side of a flat bed without using bedrails?: A Little Help needed moving to and from a bed to a chair (including a wheelchair)?: A Little Help needed standing up from a chair using your arms (e.g., wheelchair or bedside chair)?: A Little Help needed to walk in hospital room?: A Little Help needed climbing 3-5 steps with a railing? : A Little 6 Click Score: 18    End of Session Equipment Utilized During Treatment: Gait belt Activity Tolerance: Patient tolerated treatment well Patient left: in bed;with call bell/phone within reach;with family/visitor present;with bed alarm set Nurse Communication: Other (comment) (pt's desire to take bath) PT Visit Diagnosis: Muscle weakness (generalized) (M62.81);Other abnormalities of gait and mobility (R26.89)     Time: 0940-1000 PT Time Calculation (min) (ACUTE  ONLY): 20 min  Charges:    $Gait Training: 8-22 mins PT General Charges $$ ACUTE PT VISIT: 1 Visit                     [address], SPT    [place] 05/30/2023, 12:39 PM

## 2023-05-30 NOTE — Progress Notes (Signed)
 Occupational Therapy Treatment Patient Details Name: Charles Strong MRN: [id number] DOB: [birth year] Today's Date: 05/30/2023   History of present illness Pt is a 83 y/o male presenting on 4/2 with generalized weakness. Found with sepsis secondary to complicated UTI in setting of L ureteral kidney stone. 4/3 s/p cystoscopy, L retrograde pyelogram and insertion lf L ureteral stent. PMH includes: DM 2, recent hospital stay 3/20-22 with fall and nasal fx.   OT comments  Patient supine in bed and agreeable to OT session. Completes ADLs with modified independence (LB dressing using figure 4 technique), transfers with modified independence and simulated shower transfers with modified independence. Discussed energy conservation techniques for return to normal every day activities.  Pt and spouse verbalize understanding and safety recommendations.  No further acute OT needs identified and OT will sign off.       If plan is discharge home, recommend the following:  Assistance with cooking/housework;Assist for transportation   Equipment Recommendations  Tub/shower seat    Recommendations for Other Services      Precautions / Restrictions Precautions Precautions: Fall Recall of Precautions/Restrictions: Intact Precaution/Restrictions Comments: foley Restrictions Weight Bearing Restrictions Per Provider Order: No       Mobility Bed Mobility Overal bed mobility: Modified Independent Bed Mobility: Supine to Sit, Sit to Supine                Transfers Overall transfer level: Needs assistance   Transfers: Sit to/from Stand Sit to Stand: Modified independent (Device/Increase time)                 Balance Overall balance assessment: Mild deficits observed, not formally tested                                         ADL either performed or assessed with clinical judgement   ADL Overall ADL's : Modified independent                                        General ADL Comments: no assist required, reviewed shower transfesr and safety    Extremity/Trunk Assessment              Vision       Perception     Praxis     Communication Communication Communication: No apparent difficulties   Cognition Arousal: Alert Behavior During Therapy: WFL for tasks assessed/performed Cognition: No apparent impairments                               Following commands: Intact        Cueing   Cueing Techniques: Verbal cues  Exercises      Shoulder Instructions       General Comments      Pertinent Vitals/ Pain       Pain Assessment Pain Assessment: No/denies pain  Home Living                                          Prior Functioning/Environment              Frequency  Min 2X/week  Progress Toward Goals  OT Goals(current goals can now be found in the care plan section)  Progress towards OT goals: Goals met/education completed, patient discharged from OT  Acute Rehab OT Goals Patient Stated Goal: home OT Goal Formulation: With patient  Plan      Co-evaluation                 AM-PAC OT "6 Clicks" Daily Activity     Outcome Measure   Help from another person eating meals?: None Help from another person taking care of personal grooming?: None Help from another person toileting, which includes using toliet, bedpan, or urinal?: None Help from another person bathing (including washing, rinsing, drying)?: None Help from another person to put on and taking off regular upper body clothing?: None Help from another person to put on and taking off regular lower body clothing?: None 6 Click Score: 24    End of Session    OT Visit Diagnosis: Other abnormalities of gait and mobility (R26.89);Muscle weakness (generalized) (M62.81);History of falling (Z91.81)   Activity Tolerance Patient tolerated treatment well   Patient Left in bed;with call bell/phone within  reach;with bed alarm set;with family/visitor present   Nurse Communication Mobility status        Time: 1610-9604 OT Time Calculation (min): 12 min  Charges: OT General Charges $OT Visit: 1 Visit OT Treatments $Self Care/Home Management : 8-22 mins  Barry Brunner, OT Acute Rehabilitation Services Office [phone] Secure Chat Preferred    [name] 05/30/2023, 1:04 PM

## 2023-05-30 NOTE — Progress Notes (Signed)
 Progress Note   Patient: Charles Strong KVQ:[id number] DOB: February 23, 1941 DOA: 05/28/2023     1 DOS: the patient was seen and examined on 05/30/2023   Brief hospital course: 83 y.o. male with medical history significant for BPH, type 2 diabetes, chronic HFpEF, recently admitted on 05/15/2023 for left-sided pyelonephritis and discharged on 05/17/2023 who initially presented to [facility] ED due to generalized weakness, fall, and head injury.   In the ER, tachycardic with soft BPs.  Lab studies notable for leukocytosis greater than 34,000, lactic acidosis 2.9, elevated creatinine above baseline, UA positive for pyuria.  Code sepsis was called in the ER.  CT chest abdomen pelvis was ordered to look for other sources of infection.  It revealed 8 mm distal left ureteral stone with mild hydronephrosis, prostate enlargement, bladder wall thickening that may be related to chronic bladder outlet obstruction.  The patient received his first dose of IV antibiotics and IV fluid per sepsis protocol in the ER.   EDP discussed the case with urology who recommended admission by medicine team for further management.  Urology, Dr. Pete Glatter, planned for ureteral stent placement.  Admitted by [facility], hospitalist service.  Assessment and Plan: Sepsis secondary to complicated klebsiella UTI in the setting of distal left ureteral kidney stone, POA with bacteremia Presented with leukocytosis greater than 34K, lactic acidosis 2.9, UA positive for pyuria -urine and blood cultures pos for klebsiella only resistant to amp, sensitive to all others Cont on rocephin Urology following. Pt now s/p stent placement. Likely TOV tomorrow Hematuria improved   AKI, suspect multifactorial, from chronic bladder outlet obstruction in the setting of BPH, and sepsis. Given IVF hydration resolved   Mild non-anion gap metabolic acidosis in the setting of acute renal insufficiency Presented with serum bicarb of 21 and anion gap of 14 given IV  fluid hydration resolved   Elevated liver chemistries, unclear etiology Alkaline phosphatase 205, AST 45 normalized   Type 2 diabetes with hyperglycemia Last hemoglobin A1c 7.3 on 05/16/2023 Presented with serum glucose 253 Continue ssi as needed   BPH Urology following Currently with foley cath   Chronic HFpEF Euvolemic on exam Last 2D echo done on 05/16/2023 revealed LVEF 60-65% and grade 1 diastolic dysfunction. Monitor strict I's and O's and daily weight. Stable   Generalized weakness, suspect contributed by sepsis Continue to treat underlying conditions PT OT consulted   Recent fall with head trauma CT maxillo facial without contrast revealed minimally displaced nasal bone fractures No acute intracranial abnormality on CT head without contrast.   Degenerative disc and facet disease.  No acute bony abnormality seen on CT cervical spine without contrast. Continue fall precautions and as needed analgesics       Subjective: Without complaints. Eager to go home soon  Physical Exam: Vitals:   05/30/23 0237 05/30/23 0500 05/30/23 0814 05/30/23 1224  BP: 132/78  123/77 119/68  Pulse: 76  87 73  Resp: 18  12 14   Temp: 97.6 F (36.4 C)  97.6 F (36.4 C) 98 F (36.7 C)  TempSrc: Oral  Oral Oral  SpO2: 95%  94% 96%  Weight:  62.9 kg    Height:       General exam: Conversant, in no acute distress Respiratory system: normal chest rise, clear, no audible wheezing Cardiovascular system: regular rhythm, s1-s2 Gastrointestinal system: Nondistended, nontender, pos BS Central nervous system: No seizures, no tremors Extremities: No cyanosis, no joint deformities Skin: No rashes, no pallor Psychiatry: Affect normal // no auditory hallucinations  Data Reviewed:  Labs reviewed: Na 134, K 3.8, Cr 1.07, WBC 21.0, Hgb 10.5  Family Communication: Pt in room, pt's family at bedside  Disposition: Status is: Inpatient Remains inpatient appropriate because: severity of  illness  Planned Discharge Destination: Home    Author: Rickey Barbara, MD 05/30/2023 4:12 PM  For on call review [email or web address].

## 2023-05-30 NOTE — Progress Notes (Signed)
 1 Day Post-Op Subjective: Charles Strong.  First time meeting Charles Strong.  He was accompanied by a family member.  Case and plan was reviewed and all questions were answered to their satisfaction.  Objective: Vital signs in last 24 hours: Temp:  [97.6 F (36.4 C)-98 F (36.7 C)] 98 F (36.7 C) (04/04 1224) Pulse Rate:  [76-87] 87 (04/04 0814) Resp:  [12-20] 14 (04/04 1224) BP: (123-132)/(77-82) 123/77 (04/04 0814) SpO2:  [94 %-97 %] 94 % (04/04 0814) Weight:  [62.9 kg] 62.9 kg (04/04 0500)  Assessment/Plan: #Left ureteral stone # Klebsiella UTI # Klebsiella bacteremia # Sepsis  To the OR for ureteral stent placement with Dr. Pete Glatter on 05/29/2023 Consider TOV tomorrow Tentative stone management on outpatient basis Trend labs, improving but significant leukocytosis Urology will follow along peripherally.  I will check on him again toward the beginning of the week  Intake/Output from previous day: 04/03 0701 - 04/04 0700 In: 645 [P.O.:480; IV Piggyback:125] Out: 2050 [Urine:2050]  Intake/Output this shift: No intake/output data recorded.  Physical Exam:  General: Alert and oriented CV: No cyanosis Lungs: equal chest rise Gu: Foley catheter in place  Lab Results: Recent Labs    05/28/23 1815 05/29/23 0848 05/30/23 0613  HGB 13.7 11.5* 10.5*  HCT 39.6 34.2* 31.6*   BMET Recent Labs    05/29/23 0848 05/30/23 0613  NA 137 134*  K 4.3 3.8  CL 105 99  CO2 21* 22  GLUCOSE 203* 251*  BUN 17 25*  CREATININE 1.17 1.07  CALCIUM 8.0* 7.7*     Studies/Results: DG C-Arm 1-60 Min-No Report Result Date: 05/29/2023 Fluoroscopy was utilized by the requesting physician.  No radiographic interpretation.   CT ABDOMEN PELVIS W CONTRAST Result Date: 05/28/2023 CLINICAL DATA:  Sepsis Recent pyelonephritis.  Fall. EXAM: CT ABDOMEN AND PELVIS WITH CONTRAST TECHNIQUE: Multidetector CT imaging of the abdomen and pelvis was performed using the standard protocol following bolus  administration of intravenous contrast. RADIATION DOSE REDUCTION: This exam was performed according to the departmental dose-optimization program which includes automated exposure control, adjustment of the mA and/or kV according to patient size and/or use of iterative reconstruction technique. CONTRAST:  80mL OMNIPAQUE IOHEXOL 300 MG/ML  SOLN COMPARISON:  05/15/2023 FINDINGS: Lower chest: No acute abnormality Hepatobiliary: Small 1-2 mm layering gallstone, stable. No wall thickening or biliary ductal dilatation. No focal hepatic abnormality. Pancreas: No focal abnormality or ductal dilatation. Spleen: No focal abnormality.  Normal size. Adrenals/Urinary Tract: Adrenal glands normal. Previously seen left lower pole renal stone has migrated and is now in the distal left ureter measuring 8 mm. Mild left hydronephrosis. No definite current signs of pyelonephritis. No stones or hydronephrosis on the right. Urinary bladder appears thick walled, partially decompressed. Stomach/Bowel: Sigmoid diverticulosis. No active diverticulitis. Stomach and small bowel decompressed, unremarkable. Vascular/Lymphatic: Aortic atherosclerosis. No evidence of aneurysm or adenopathy. Reproductive: Prostate enlargement. Other: No free fluid or free air. Musculoskeletal: No acute bony abnormality. IMPRESSION: Migration of the previously seen left lower pole renal stone into the left ureter. 8 mm stone in the distal left ureter of mild left hydronephrosis. Sigmoid diverticulosis. Aortic atherosclerosis. Prostate enlargement. Bladder wall thickening may be related to chronic bladder outlet obstruction. Electronically Signed   By: Charlett Nose M.D.   On: 05/28/2023 19:56   CT Maxillofacial WO CM Result Date: 05/28/2023 CLINICAL DATA:  Bruising around nose from fall. EXAM: CT MAXILLOFACIAL WITHOUT CONTRAST TECHNIQUE: Multidetector CT imaging of the maxillofacial structures was performed. Multiplanar CT image reconstructions were also  generated.  RADIATION DOSE REDUCTION: This exam was performed according to the departmental dose-optimization program which includes automated exposure control, adjustment of the mA and/or kV according to patient size and/or use of iterative reconstruction technique. COMPARISON:  None Available. FINDINGS: Osseous: Fractures noted through the nasal bones, minimally displaced. 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 Mandible and zygomatic arches intact. Orbits: No fracture or orbital emphysema.  Globes intact. Sinuses: Clear Soft tissues: Negative Limited intracranial: See head CT report IMPRESSION: Minimally displaced nasal bone fractures. Electronically Signed   By: Charlett Nose M.D.   On: 05/28/2023 19:52   CT Cervical Spine Wo Contrast Result Date: 05/28/2023 CLINICAL DATA:  Fall EXAM: CT CERVICAL SPINE WITHOUT CONTRAST TECHNIQUE: Multidetector CT imaging of the cervical spine was performed without intravenous contrast. Multiplanar CT image reconstructions were also generated. RADIATION DOSE REDUCTION: This exam was performed according to the departmental dose-optimization program which includes automated exposure control, adjustment of the mA and/or kV according to patient size and/or use of iterative reconstruction technique. COMPARISON:  None Available. FINDINGS: Alignment: Normal. Skull base and vertebrae: No acute fracture. No primary bone lesion or focal pathologic process. Soft tissues and spinal canal: No prevertebral fluid or swelling. No visible canal hematoma. Disc levels: Diffuse mild degenerative disc disease with disc space narrowing and spurring. Mild bilateral degenerative facet disease, left greater than right. Upper chest: No acute findings. Calcified granuloma in the left upper lobe. Other: None IMPRESSION: Degenerative disc and facet disease.  No acute bony abnormality. Electronically Signed   By: Charlett Nose M.D.   On: 05/28/2023 19:50   CT Head Wo  Contrast Result Date: 05/28/2023 CLINICAL DATA:  Fall EXAM: CT HEAD WITHOUT CONTRAST TECHNIQUE: Contiguous axial images were obtained from the base of the skull through the vertex without intravenous contrast. RADIATION DOSE REDUCTION: This exam was performed according to the departmental dose-optimization program which includes automated exposure control, adjustment of the mA and/or kV according to patient size and/or use of iterative reconstruction technique. COMPARISON:  None Available. FINDINGS: Brain: No acute intracranial abnormality. Specifically, no hemorrhage, hydrocephalus, mass lesion, acute infarction, or significant intracranial injury. Vascular: No hyperdense vessel or unexpected calcification. Skull: No acute calvarial abnormality. Sinuses/Orbits: No acute findings Other: None IMPRESSION: No acute intracranial abnormality. Electronically Signed   By: Charlett Nose M.D.   On: 05/28/2023 19:48   DG Chest 1 View Result Date: 05/28/2023 CLINICAL DATA:  Weakness and sepsis. EXAM: CHEST  1 VIEW COMPARISON:  05/15/2023 FINDINGS: Stable cardiomediastinal silhouette. Aortic atherosclerotic calcification. No focal consolidation, pleural effusion, or pneumothorax. No displaced rib fractures. IMPRESSION: No acute cardiopulmonary disease. Electronically Signed   By: Minerva Fester M.D.   On: 05/28/2023 19:42      LOS: 1 day   Elmon Kirschner, NP [facility] Pager: [phone]  05/30/2023, 1:53 PM

## 2023-05-31 ENCOUNTER — Other Ambulatory Visit (HOSPITAL_COMMUNITY): Payer: Self-pay

## 2023-05-31 DIAGNOSIS — N3001 Acute cystitis with hematuria: Secondary | ICD-10-CM | POA: Diagnosis not present

## 2023-05-31 DIAGNOSIS — R6521 Severe sepsis with septic shock: Secondary | ICD-10-CM | POA: Diagnosis not present

## 2023-05-31 DIAGNOSIS — N201 Calculus of ureter: Secondary | ICD-10-CM | POA: Diagnosis not present

## 2023-05-31 DIAGNOSIS — A419 Sepsis, unspecified organism: Secondary | ICD-10-CM | POA: Diagnosis not present

## 2023-05-31 LAB — CBC
HCT: 34 % — ABNORMAL LOW (ref 39.0–52.0)
Hemoglobin: 11.3 g/dL — ABNORMAL LOW (ref 13.0–17.0)
MCH: 28.6 pg (ref 26.0–34.0)
MCHC: 33.2 g/dL (ref 30.0–36.0)
MCV: 86.1 fL (ref 80.0–100.0)
Platelets: 210 10*3/uL (ref 150–400)
RBC: 3.95 MIL/uL — ABNORMAL LOW (ref 4.22–5.81)
RDW: 14.4 % (ref 11.5–15.5)
WBC: 10.8 10*3/uL — ABNORMAL HIGH (ref 4.0–10.5)
nRBC: 0 % (ref 0.0–0.2)

## 2023-05-31 LAB — CULTURE, BLOOD (ROUTINE X 2)
Special Requests: ADEQUATE
Special Requests: ADEQUATE

## 2023-05-31 LAB — GLUCOSE, CAPILLARY
Glucose-Capillary: 273 mg/dL — ABNORMAL HIGH (ref 70–99)
Glucose-Capillary: 124 mg/dL — ABNORMAL HIGH (ref 70–99)

## 2023-05-31 LAB — COMPREHENSIVE METABOLIC PANEL WITH GFR
ALT: 23 U/L (ref 0–44)
AST: 24 U/L (ref 15–41)
Albumin: 2.1 g/dL — ABNORMAL LOW (ref 3.5–5.0)
Alkaline Phosphatase: 148 U/L — ABNORMAL HIGH (ref 38–126)
Anion gap: 13 (ref 5–15)
BUN: 17 mg/dL (ref 8–23)
CO2: 24 mmol/L (ref 22–32)
Calcium: 8.2 mg/dL — ABNORMAL LOW (ref 8.9–10.3)
Chloride: 101 mmol/L (ref 98–111)
Creatinine, Ser: 1.14 mg/dL (ref 0.61–1.24)
GFR, Estimated: >60 mL/min (ref >60)
Glucose, Bld: 177 mg/dL — ABNORMAL HIGH (ref 70–99)
Potassium: 4.2 mmol/L (ref 3.5–5.1)
Sodium: 138 mmol/L (ref 135–145)
Total Bilirubin: 0.6 mg/dL (ref 0.0–1.2)
Total Protein: 5.5 g/dL — ABNORMAL LOW (ref 6.5–8.1)

## 2023-05-31 MED ORDER — KETOROLAC TROMETHAMINE 10 MG PO TABS: 10.0000 mg | ORAL_TABLET | Freq: Four times a day (QID) | ORAL | 0 refills | Status: DC | PRN

## 2023-05-31 MED ORDER — KETOROLAC TROMETHAMINE 10 MG PO TABS
10.0000 mg | ORAL_TABLET | Freq: Four times a day (QID) | ORAL | 0 refills | Status: DC | PRN
  Filled 2023-05-31: qty 20, 5d supply, fill #0

## 2023-05-31 MED ORDER — AMOXICILLIN-POT CLAVULANATE 875-125 MG PO TABS
1.0000 | ORAL_TABLET | Freq: Two times a day (BID) | ORAL | 0 refills | Status: AC
  Filled 2023-05-31: qty 20, 10d supply, fill #0

## 2023-05-31 MED ORDER — FINASTERIDE 5 MG PO TABS
5.0000 mg | ORAL_TABLET | Freq: Every day | ORAL | Status: DC
  Administered 2023-05-31: 5 mg via ORAL
  Filled 2023-05-31: qty 1

## 2023-05-31 NOTE — Progress Notes (Signed)
 Pt voided clear yellow urine 475 ml in urinal. Dionne Bucy RN

## 2023-05-31 NOTE — Progress Notes (Signed)
 Pt discharge education and instructions completed with pt and spouse at bedside. Both voices understanding and denies any questions. Pt IV's and telemetry removed prior to discharge. Pt discharged home with spouse to transport him home. Pt handed his TOC prescription for Augmentin. Pt to pick up electronically sent prescription from preferred pharmacy on file. Pt transported off unit via wheelchair with spouse and belongings to the side. Dionne Bucy RN

## 2023-05-31 NOTE — Progress Notes (Signed)
 Pt foley removed per protocol. Pt due to void. Dionne Bucy RN

## 2023-05-31 NOTE — Discharge Summary (Signed)
 Physician Discharge Summary   Patient: Charles Strong MRN: [id number] DOB: 09-Apr-1940  Admit date:     05/28/2023  Discharge date: 05/31/23  Discharge Physician: Rickey Barbara   PCP: Lorenda Ishihara, MD   Recommendations at discharge:    Follow up with PCP in 1-2 weeks Follow up with Urology as scheduled  Discharge Diagnoses: Principal Problem:   Ureteral calculus, left Active Problems:   Sepsis with acute renal failure and septic shock (HCC)   Acute cystitis with hematuria   Sepsis (HCC)  Resolved Problems:   * No resolved hospital problems. *  Hospital Course: 83 y.o. male with medical history significant for BPH, type 2 diabetes, chronic HFpEF, recently admitted on 05/15/2023 for left-sided pyelonephritis and discharged on 05/17/2023 who initially presented to [facility] ED due to generalized weakness, fall, and head injury.   In the ER, tachycardic with soft BPs.  Lab studies notable for leukocytosis greater than 34,000, lactic acidosis 2.9, elevated creatinine above baseline, UA positive for pyuria.  Code sepsis was called in the ER.  CT chest abdomen pelvis was ordered to look for other sources of infection.  It revealed 8 mm distal left ureteral stone with mild hydronephrosis, prostate enlargement, bladder wall thickening that may be related to chronic bladder outlet obstruction.  The patient received his first dose of IV antibiotics and IV fluid per sepsis protocol in the ER.   EDP discussed the case with urology who recommended admission by medicine team for further management.  Urology, Dr. Pete Glatter, planned for ureteral stent placement.  Admitted by [facility], hospitalist service.  Assessment and Plan: Sepsis secondary to complicated klebsiella UTI in the setting of distal left ureteral kidney stone, POA with bacteremia Presented with leukocytosis greater than 34K, lactic acidosis 2.9, UA positive for pyuria -urine and blood cultures pos for klebsiella only resistant to  amp, sensitive to all others Cont on rocephin Urology had been following. Pt now s/p stent placement. Successfully passed voiding trial. Hematuria improved Discussed with Urology on call. OK to d/c today   AKI, suspect multifactorial, from chronic bladder outlet obstruction in the setting of BPH, and sepsis. Given IVF hydration resolved   Mild non-anion gap metabolic acidosis in the setting of acute renal insufficiency Presented with serum bicarb of 21 and anion gap of 14 given IV fluid hydration resolved   Elevated liver chemistries, unclear etiology Alkaline phosphatase 205, AST 45 normalized   Type 2 diabetes with hyperglycemia Last hemoglobin A1c 7.3 on 05/16/2023 Presented with serum glucose 253 Continue ssi as needed   BPH Urology following Currently with foley cath   Chronic HFpEF Euvolemic on exam Last 2D echo done on 05/16/2023 revealed LVEF 60-65% and grade 1 diastolic dysfunction. Monitor strict I's and O's and daily weight. Stable   Generalized weakness, suspect contributed by sepsis Continue to treat underlying conditions PT OT consulted   Recent fall with head trauma CT maxillo facial without contrast revealed minimally displaced nasal bone fractures No acute intracranial abnormality on CT head without contrast.   Degenerative disc and facet disease.  No acute bony abnormality seen on CT cervical spine without contrast. Continue fall precautions and as needed analgesics    Consultants: Urology Procedures performed: Ureteral stent placement  Disposition: Home Diet recommendation:  Carb modified diet DISCHARGE MEDICATION: Allergies as of 05/31/2023       Reactions   Tramadol Nausea And Vomiting, Other (See Comments)   Blackouts,chills,    Calcium Fortified Cookie [nutritional Supplements] Diarrhea   Egg-derived Products  Diarrhea   Metformin And Related Diarrhea        Medication List     STOP taking these medications    silodosin 8 MG Caps  capsule Commonly known as: RAPAFLO       TAKE these medications    acetaminophen 500 MG tablet Commonly known as: TYLENOL Take 1,000 mg by mouth every 6 (six) hours as needed for mild pain (pain score 1-3), fever or headache.   amoxicillin-clavulanate 875-125 MG tablet Commonly known as: AUGMENTIN Take 1 tablet by mouth 2 (two) times daily for 10 days.   BERBERINE COMPLEX PO Take 1 capsule by mouth daily.   Cholecalciferol 250 MCG (10000 UT) Caps Take 1 capsule by mouth daily.   finasteride 5 MG tablet Commonly known as: PROSCAR Take 5 mg by mouth daily.   fluticasone 50 MCG/ACT nasal spray Commonly known as: FLONASE PLACE 2 SPRAYS IN EACH NOSTRIL DAILY AS NEEDED FOR ALLERGIES   glipiZIDE 5 MG 24 hr tablet Commonly known as: GLUCOTROL XL Take 5 mg by mouth daily.   ketorolac 10 MG tablet Commonly known as: TORADOL Take 1 tablet (10 mg total) by mouth every 6 (six) hours as needed for moderate pain (pain score 4-6).   Magnesium Glycinate 100 MG Caps Take 2 capsules by mouth daily.   metFORMIN 500 MG 24 hr tablet Commonly known as: GLUCOPHAGE-XR Take 500 mg by mouth daily with breakfast.   Nitrostat 0.4 MG SL tablet Generic drug: nitroGLYCERIN PLACE 1 TABLET UNDER THE TONGUE EVERY 5 MINUTES AS NEEDED FOR CHEST PAIN   polyethylene glycol powder 17 GM/SCOOP powder Commonly known as: GLYCOLAX/MIRALAX Take 17 g by mouth as needed for mild constipation.   selenium 200 MCG Tabs tablet Take 200 mcg by mouth daily.   thiamine 100 MG tablet Commonly known as: Vitamin B-1 Take 100 mg by mouth daily.   vitamin B-12 100 MCG tablet Commonly known as: CYANOCOBALAMIN Take 100 mcg by mouth daily.   Vitamin K2 100 MCG Caps Take 1 capsule by mouth daily.        Follow-up Information     Lorenda Ishihara, MD Follow up in 2 week(s).   Specialty: Internal Medicine Why: Hospital follow up Contact information: [address] [place]  [address] [phone]         [facility] Follow up.   Why: Hospital follow up, as scheduled Contact information: [address] [place] [address] [place] [address] [phone]               Discharge Exam: [name] Weights   05/28/23 1737 05/30/23 0500  Weight: 63.5 kg 62.9 kg   General exam: Awake, laying in bed, in nad Respiratory system: Normal respiratory effort, no wheezing Cardiovascular system: regular rate, s1, s2 Gastrointestinal system: Soft, nondistended, positive BS Central nervous system: CN2-12 grossly intact, strength intact Extremities: Perfused, no clubbing Skin: Normal skin turgor, no notable skin lesions seen Psychiatry: Mood normal // no visual hallucinations   Condition at discharge: fair  The results of significant diagnostics from this hospitalization (including imaging, microbiology, ancillary and laboratory) are listed below for reference.   Imaging Studies: DG C-Arm 1-60 Min-No Report Result Date: 05/29/2023 Fluoroscopy was utilized by the requesting physician.  No radiographic interpretation.   CT ABDOMEN PELVIS W CONTRAST Result Date: 05/28/2023 CLINICAL DATA:  Sepsis Recent pyelonephritis.  Fall. EXAM: CT ABDOMEN AND PELVIS WITH CONTRAST TECHNIQUE: Multidetector CT imaging of the abdomen and pelvis was performed using  the standard protocol following bolus administration of intravenous contrast. RADIATION DOSE REDUCTION: This exam was performed according to the departmental dose-optimization program which includes automated exposure control, adjustment of the mA and/or kV according to patient size and/or use of iterative reconstruction technique. CONTRAST:  80mL OMNIPAQUE IOHEXOL 300 MG/ML  SOLN COMPARISON:  05/15/2023 FINDINGS: Lower chest: No acute abnormality Hepatobiliary: Small 1-2 mm layering gallstone, stable. No wall thickening or biliary ductal dilatation. No focal hepatic abnormality. Pancreas: No focal abnormality or  ductal dilatation. Spleen: No focal abnormality.  Normal size. Adrenals/Urinary Tract: Adrenal glands normal. Previously seen left lower pole renal stone has migrated and is now in the distal left ureter measuring 8 mm. Mild left hydronephrosis. No definite current signs of pyelonephritis. No stones or hydronephrosis on the right. Urinary bladder appears thick walled, partially decompressed. Stomach/Bowel: Sigmoid diverticulosis. No active diverticulitis. Stomach and small bowel decompressed, unremarkable. Vascular/Lymphatic: Aortic atherosclerosis. No evidence of aneurysm or adenopathy. Reproductive: Prostate enlargement. Other: No free fluid or free air. Musculoskeletal: No acute bony abnormality. IMPRESSION: Migration of the previously seen left lower pole renal stone into the left ureter. 8 mm stone in the distal left ureter of mild left hydronephrosis. Sigmoid diverticulosis. Aortic atherosclerosis. Prostate enlargement. Bladder wall thickening may be related to chronic bladder outlet obstruction. Electronically Signed   By: Charlett Nose M.D.   On: 05/28/2023 19:56   CT Maxillofacial WO CM Result Date: 05/28/2023 CLINICAL DATA:  Bruising around nose from fall. EXAM: CT MAXILLOFACIAL WITHOUT CONTRAST TECHNIQUE: Multidetector CT imaging of the maxillofacial structures was performed. Multiplanar CT image reconstructions were also generated. RADIATION DOSE REDUCTION: This exam was performed according to the departmental dose-optimization program which includes automated exposure control, adjustment of the mA and/or kV according to patient size and/or use of iterative reconstruction technique. COMPARISON:  None Available. FINDINGS: Osseous: Fractures noted through the nasal bones, minimally displaced. 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 Mandible and zygomatic arches intact. Orbits: No fracture or orbital emphysema.  Globes intact. Sinuses: Clear Soft tissues: Negative  Limited intracranial: See head CT report IMPRESSION: Minimally displaced nasal bone fractures. Electronically Signed   By: Charlett Nose M.D.   On: 05/28/2023 19:52   CT Cervical Spine Wo Contrast Result Date: 05/28/2023 CLINICAL DATA:  Fall EXAM: CT CERVICAL SPINE WITHOUT CONTRAST TECHNIQUE: Multidetector CT imaging of the cervical spine was performed without intravenous contrast. Multiplanar CT image reconstructions were also generated. RADIATION DOSE REDUCTION: This exam was performed according to the departmental dose-optimization program which includes automated exposure control, adjustment of the mA and/or kV according to patient size and/or use of iterative reconstruction technique. COMPARISON:  None Available. FINDINGS: Alignment: Normal. Skull base and vertebrae: No acute fracture. No primary bone lesion or focal pathologic process. Soft tissues and spinal canal: No prevertebral fluid or swelling. No visible canal hematoma. Disc levels: Diffuse mild degenerative disc disease with disc space narrowing and spurring. Mild bilateral degenerative facet disease, left greater than right. Upper chest: No acute findings. Calcified granuloma in the left upper lobe. Other: None IMPRESSION: Degenerative disc and facet disease.  No acute bony abnormality. Electronically Signed   By: Charlett Nose M.D.   On: 05/28/2023 19:50   CT Head Wo Contrast Result Date: 05/28/2023 CLINICAL DATA:  Fall EXAM: CT HEAD WITHOUT CONTRAST TECHNIQUE: Contiguous axial images were obtained from the base of the skull through the vertex without intravenous contrast. RADIATION DOSE REDUCTION: This exam was performed according to  the departmental dose-optimization program which includes automated exposure control, adjustment of the mA and/or kV according to patient size and/or use of iterative reconstruction technique. COMPARISON:  None Available. FINDINGS: Brain: No acute intracranial abnormality. Specifically, no hemorrhage, hydrocephalus,  mass lesion, acute infarction, or significant intracranial injury. Vascular: No hyperdense vessel or unexpected calcification. Skull: No acute calvarial abnormality. Sinuses/Orbits: No acute findings Other: None IMPRESSION: No acute intracranial abnormality. Electronically Signed   By: Charlett Nose M.D.   On: 05/28/2023 19:48   DG Chest 1 View Result Date: 05/28/2023 CLINICAL DATA:  Weakness and sepsis. EXAM: CHEST  1 VIEW COMPARISON:  05/15/2023 FINDINGS: Stable cardiomediastinal silhouette. Aortic atherosclerotic calcification. No focal consolidation, pleural effusion, or pneumothorax. No displaced rib fractures. IMPRESSION: No acute cardiopulmonary disease. Electronically Signed   By: Minerva Fester M.D.   On: 05/28/2023 19:42   ECHOCARDIOGRAM COMPLETE Result Date: 05/16/2023    ECHOCARDIOGRAM REPORT   Patient Name:   Charles Strong Date of Exam: 05/16/2023 Medical Rec #:  [id number]         Height:       70.0 in Accession #:    [id number]        Weight:       143.2 lb Date of Birth:  02-Nov-1940         BSA:          1.811 m Patient Age:    82 years          BP:           125/71 mmHg Patient Gender: M                 HR:           64 bpm. Exam Location:  Inpatient Procedure: 2D Echo, Cardiac Doppler and Color Doppler (Both Spectral and Color            Flow Doppler were utilized during procedure). Indications:    Elevated Troponin  History:        Patient has no prior history of Echocardiogram examinations.                 Signs/Symptoms:Chest Pain; Risk Factors:Diabetes and                 Dyslipidemia.  Sonographer:    Lucendia Herrlich RCS Referring Phys: [phone] DEBBY CROSLEY IMPRESSIONS  1. Left ventricular ejection fraction, by estimation, is 60 to 65%. Left ventricular ejection fraction by PLAX is 61 %. The left ventricle has normal function. The left ventricle has no regional wall motion abnormalities. Left ventricular diastolic parameters are consistent with Grade I diastolic dysfunction (impaired  relaxation).  2. Right ventricular systolic function is normal. The right ventricular size is normal. The estimated right ventricular systolic pressure is 20.0 mmHg.  3. The mitral valve is grossly normal. Trivial mitral valve regurgitation.  4. The aortic valve is tricuspid. Aortic valve regurgitation is not visualized.  5. The inferior vena cava is dilated in size with >50% respiratory variability, suggesting right atrial pressure of 8 mmHg. Comparison(s): No prior Echocardiogram. FINDINGS  Left Ventricle: Left ventricular ejection fraction, by estimation, is 60 to 65%. Left ventricular ejection fraction by PLAX is 61 %. The left ventricle has normal function. The left ventricle has no regional wall motion abnormalities. The left ventricular internal cavity size was normal in size. There is no left ventricular hypertrophy. Left ventricular diastolic parameters are consistent with Grade I diastolic dysfunction (impaired relaxation). Indeterminate filling  pressures. Right Ventricle: The right ventricular size is normal. No increase in right ventricular wall thickness. Right ventricular systolic function is normal. The tricuspid regurgitant velocity is 1.73 m/s, and with an assumed right atrial pressure of 8 mmHg, the estimated right ventricular systolic pressure is 20.0 mmHg. Left Atrium: Left atrial size was normal in size. Right Atrium: Right atrial size was normal in size. Pericardium: There is no evidence of pericardial effusion. Mitral Valve: The mitral valve is grossly normal. Trivial mitral valve regurgitation. Tricuspid Valve: The tricuspid valve is grossly normal. Tricuspid valve regurgitation is trivial. Aortic Valve: The aortic valve is tricuspid. Aortic valve regurgitation is not visualized. Aortic valve peak gradient measures 6.2 mmHg. Pulmonic Valve: The pulmonic valve was normal in structure. Pulmonic valve regurgitation is not visualized. Aorta: The aortic root and ascending aorta are structurally  normal, with no evidence of dilitation. Venous: The inferior vena cava is dilated in size with greater than 50% respiratory variability, suggesting right atrial pressure of 8 mmHg. IAS/Shunts: No atrial level shunt detected by color flow Doppler.  LEFT VENTRICLE PLAX 2D LV EF:         Left            Diastology                ventricular     LV e' medial:    7.72 cm/s                ejection        LV E/e' medial:  7.3                fraction by     LV e' lateral:   9.46 cm/s                PLAX is 61      LV E/e' lateral: 6.0                %. LVIDd:         4.30 cm LVIDs:         2.90 cm LV PW:         0.70 cm LV IVS:        0.80 cm LVOT diam:     2.00 cm LV SV:         53 LV SV Index:   29 LVOT Area:     3.14 cm  RIGHT VENTRICLE             IVC RV S prime:     11.30 cm/s  IVC diam: 2.40 cm TAPSE (M-mode): 2.2 cm LEFT ATRIUM             Index        RIGHT ATRIUM           Index LA diam:        3.60 cm 1.99 cm/m   RA Area:     15.00 cm LA Vol (A2C):   44.0 ml 24.30 ml/m  RA Volume:   34.60 ml  19.11 ml/m LA Vol (A4C):   30.2 ml 16.68 ml/m LA Biplane Vol: 39.2 ml 21.65 ml/m  AORTIC VALVE AV Area (Vmax): 2.18 cm AV Vmax:        124.00 cm/s AV Peak Grad:   6.2 mmHg LVOT Vmax:      85.90 cm/s LVOT Vmean:     56.600 cm/s LVOT VTI:       0.170 m  AORTA Ao Root diam:  3.30 cm Ao Asc diam:  3.00 cm MITRAL VALVE               TRICUSPID VALVE MV Area (PHT): 3.99 cm    TR Peak grad:   12.0 mmHg MV Decel Time: 190 msec    TR Vmax:        173.00 cm/s MV E velocity: 56.60 cm/s MV A velocity: 59.70 cm/s  SHUNTS MV E/A ratio:  0.95        Systemic VTI:  0.17 m                            Systemic Diam: 2.00 cm Zoila Shutter MD Electronically signed by Zoila Shutter MD Signature Date/Time: 05/16/2023/5:54:39 PM    Final    CT Angio Chest PE W and/or Wo Contrast Result Date: 05/15/2023 CLINICAL DATA:  Pulmonary embolism (PE) suspected, high prob; Sepsis EXAM: CT ANGIOGRAPHY CHEST CT ABDOMEN AND PELVIS WITH CONTRAST  TECHNIQUE: Multidetector CT imaging of the chest was performed using the standard protocol during bolus administration of intravenous contrast. Multiplanar CT image reconstructions and MIPs were obtained to evaluate the vascular anatomy. Multidetector CT imaging of the abdomen and pelvis was performed using the standard protocol during bolus administration of intravenous contrast. RADIATION DOSE REDUCTION: This exam was performed according to the departmental dose-optimization program which includes automated exposure control, adjustment of the mA and/or kV according to patient size and/or use of iterative reconstruction technique. CONTRAST:  75mL OMNIPAQUE IOHEXOL 350 MG/ML SOLN COMPARISON:  Chest CT 03/21/2016.  Abdominal CT 10/11/2019. FINDINGS: CTA CHEST FINDINGS Cardiovascular: The pulmonary arteries are well opacified with contrast to the level of the segmental branches. There is no evidence of acute pulmonary embolism. No acute systemic arterial abnormalities. Suggested filling defect within the inferior left pulmonary veins (image 80/5) does not persist on the more delayed images obtained for the abdominal CT and is attributed to inflow artifact. The heart size is normal. There is no pericardial effusion. Mediastinum/Nodes: There are no enlarged mediastinal, hilar or axillary lymph nodes. The thyroid gland, trachea and esophagus demonstrate no significant findings. Lungs/Pleura: No pleural effusion or pneumothorax. Mild dependent atelectasis at both lung bases. There are stable scattered small pulmonary nodules, including a perifissural nodule along the minor fissure and a subpleural nodule in the left lower lobe measuring 5 mm on image 92/6. These are consistent with benign findings based on stability; no follow-up imaging recommended. Musculoskeletal/Chest wall: No chest wall mass or suspicious osseous findings. Stable left-sided bone island. CT ABDOMEN AND PELVIS FINDINGS Hepatobiliary: The liver is  normal in density without suspicious focal abnormality. Punctate calcified gallstone. No gallbladder wall thickening or biliary dilatation. Pancreas: Unremarkable. No pancreatic ductal dilatation or surrounding inflammatory changes. Spleen: Normal in size without focal abnormality. Adrenals/Urinary Tract: Both adrenal glands appear normal. There is a nonobstructing calculus in the lower pole infundibulum of the left kidney, measuring 10 mm on image 48/6, similar to previous CT. Mildly heterogeneous enhancement in the lower pole of the left kidney, best seen on the delayed post-contrast images, suspicious for pyelonephritis. No focal perinephric fluid collection. No evidence of ureteral calculus or hydronephrosis. Trace air in the urinary bladder lumen, possibly iatrogenic. Mild bladder wall thickening. Stomach/Bowel: No enteric contrast administered. The stomach appears unremarkable for its degree of distension. No evidence of bowel wall thickening, distention or surrounding inflammatory change. Mildly prominent stool in the colon. Vascular/Lymphatic: There are no enlarged abdominal or pelvic lymph  nodes. Mild aortic and branch vessel atherosclerosis without evidence of aneurysm or large vessel occlusion. Reproductive: Marked enlargement of the prostate gland protruding into the bladder base. Other: No evidence of abdominal wall mass or hernia. No ascites or pneumoperitoneum. Musculoskeletal: No acute or significant osseous findings. Lower lumbar spondylosis. Review of the MIP images confirms the above findings. IMPRESSION: 1. No evidence of acute pulmonary embolism or other acute vascular findings in the chest. 2. Mildly heterogeneous enhancement in the lower pole of the left kidney, suspicious for pyelonephritis. No evidence of focal perinephric fluid collection or hydronephrosis. 3. Nonobstructing calculus in the lower pole infundibulum of the left kidney. 4. Marked enlargement of the prostate gland protruding  into the bladder base. Mild bladder wall thickening and trace air in the urinary bladder lumen, possibly iatrogenic. 5. Cholelithiasis without evidence of cholecystitis or biliary dilatation. 6.  Aortic Atherosclerosis (ICD10-I70.0). Electronically Signed   By: Carey Bullocks M.D.   On: 05/15/2023 19:42   CT ABDOMEN PELVIS W CONTRAST Result Date: 05/15/2023 CLINICAL DATA:  Pulmonary embolism (PE) suspected, high prob; Sepsis EXAM: CT ANGIOGRAPHY CHEST CT ABDOMEN AND PELVIS WITH CONTRAST TECHNIQUE: Multidetector CT imaging of the chest was performed using the standard protocol during bolus administration of intravenous contrast. Multiplanar CT image reconstructions and MIPs were obtained to evaluate the vascular anatomy. Multidetector CT imaging of the abdomen and pelvis was performed using the standard protocol during bolus administration of intravenous contrast. RADIATION DOSE REDUCTION: This exam was performed according to the departmental dose-optimization program which includes automated exposure control, adjustment of the mA and/or kV according to patient size and/or use of iterative reconstruction technique. CONTRAST:  75mL OMNIPAQUE IOHEXOL 350 MG/ML SOLN COMPARISON:  Chest CT 03/21/2016.  Abdominal CT 10/11/2019. FINDINGS: CTA CHEST FINDINGS Cardiovascular: The pulmonary arteries are well opacified with contrast to the level of the segmental branches. There is no evidence of acute pulmonary embolism. No acute systemic arterial abnormalities. Suggested filling defect within the inferior left pulmonary veins (image 80/5) does not persist on the more delayed images obtained for the abdominal CT and is attributed to inflow artifact. The heart size is normal. There is no pericardial effusion. Mediastinum/Nodes: There are no enlarged mediastinal, hilar or axillary lymph nodes. The thyroid gland, trachea and esophagus demonstrate no significant findings. Lungs/Pleura: No pleural effusion or pneumothorax. Mild  dependent atelectasis at both lung bases. There are stable scattered small pulmonary nodules, including a perifissural nodule along the minor fissure and a subpleural nodule in the left lower lobe measuring 5 mm on image 92/6. These are consistent with benign findings based on stability; no follow-up imaging recommended. Musculoskeletal/Chest wall: No chest wall mass or suspicious osseous findings. Stable left-sided bone island. CT ABDOMEN AND PELVIS FINDINGS Hepatobiliary: The liver is normal in density without suspicious focal abnormality. Punctate calcified gallstone. No gallbladder wall thickening or biliary dilatation. Pancreas: Unremarkable. No pancreatic ductal dilatation or surrounding inflammatory changes. Spleen: Normal in size without focal abnormality. Adrenals/Urinary Tract: Both adrenal glands appear normal. There is a nonobstructing calculus in the lower pole infundibulum of the left kidney, measuring 10 mm on image 48/6, similar to previous CT. Mildly heterogeneous enhancement in the lower pole of the left kidney, best seen on the delayed post-contrast images, suspicious for pyelonephritis. No focal perinephric fluid collection. No evidence of ureteral calculus or hydronephrosis. Trace air in the urinary bladder lumen, possibly iatrogenic. Mild bladder wall thickening. Stomach/Bowel: No enteric contrast administered. The stomach appears unremarkable for its degree of distension. No evidence  of bowel wall thickening, distention or surrounding inflammatory change. Mildly prominent stool in the colon. Vascular/Lymphatic: There are no enlarged abdominal or pelvic lymph nodes. Mild aortic and branch vessel atherosclerosis without evidence of aneurysm or large vessel occlusion. Reproductive: Marked enlargement of the prostate gland protruding into the bladder base. Other: No evidence of abdominal wall mass or hernia. No ascites or pneumoperitoneum. Musculoskeletal: No acute or significant osseous findings.  Lower lumbar spondylosis. Review of the MIP images confirms the above findings. IMPRESSION: 1. No evidence of acute pulmonary embolism or other acute vascular findings in the chest. 2. Mildly heterogeneous enhancement in the lower pole of the left kidney, suspicious for pyelonephritis. No evidence of focal perinephric fluid collection or hydronephrosis. 3. Nonobstructing calculus in the lower pole infundibulum of the left kidney. 4. Marked enlargement of the prostate gland protruding into the bladder base. Mild bladder wall thickening and trace air in the urinary bladder lumen, possibly iatrogenic. 5. Cholelithiasis without evidence of cholecystitis or biliary dilatation. 6.  Aortic Atherosclerosis (ICD10-I70.0). Electronically Signed   By: Carey Bullocks M.D.   On: 05/15/2023 19:42   DG Chest 2 View Result Date: 05/15/2023 CLINICAL DATA:  Sepsis EXAM: CHEST - 2 VIEW COMPARISON:  05/14/2023 x-ray.  CT 03/21/2016 FINDINGS: The heart size and mediastinal contours are within normal limits. Hyperinflation. No consolidation, pneumothorax or effusion. No edema. Normal cardiopericardial silhouette. Calcified aorta. Old left-sided rib fractures. On the lateral view the posteroinferior costophrenic angles are clipped off the edge of the film. IMPRESSION: Hyperinflation with chronic changes. Electronically Signed   By: Karen Kays M.D.   On: 05/15/2023 17:21    Microbiology: Results for orders placed or performed during the hospital encounter of 05/28/23  Culture, blood (Routine x 2)     Status: Abnormal   Collection Time: 05/28/23  5:39 PM   Specimen: BLOOD RIGHT FOREARM  Result Value Ref Range Status   Specimen Description   Final    BLOOD RIGHT FOREARM Performed at [facility], [address], [place], [place] [address]    Special Requests   Final    BOTTLES DRAWN AEROBIC AND ANAEROBIC Blood Culture adequate volume Performed at [facility], [address] [place],  [place] [address]    Culture  Setup Time   Final    GRAM NEGATIVE RODS IN BOTH AEROBIC AND ANAEROBIC BOTTLES CRITICAL VALUE NOTED.  VALUE IS CONSISTENT WITH PREVIOUSLY REPORTED AND CALLED VALUE.    Culture (A)  Final    KLEBSIELLA PNEUMONIAE SUSCEPTIBILITIES PERFORMED ON PREVIOUS CULTURE WITHIN THE LAST 5 DAYS. Performed at [facility], [address], [place], [place] [address]    Report Status 05/31/2023 FINAL  Final  Culture, blood (Routine x 2)     Status: Abnormal   Collection Time: 05/28/23  5:50 PM   Specimen: BLOOD RIGHT WRIST  Result Value Ref Range Status   Specimen Description   Final    BLOOD RIGHT WRIST Performed at [facility], [address], [place], [place] [address]    Special Requests   Final    BOTTLES DRAWN AEROBIC AND ANAEROBIC Blood Culture adequate volume Performed at [facility], [address] [place], [place] [address]    Culture  Setup Time   Final    GRAM NEGATIVE RODS IN BOTH AEROBIC AND ANAEROBIC BOTTLES CRITICAL RESULT CALLED TO, READ BACK BY AND VERIFIED WITH: Janalyn Harder [id number] @ 1535 FH Performed at [facility], [address] [place], [place] [address]    Culture KLEBSIELLA PNEUMONIAE (A)  Final   Report Status 05/31/2023 FINAL  Final   Organism ID, Bacteria KLEBSIELLA PNEUMONIAE  Final   Organism ID, Bacteria KLEBSIELLA PNEUMONIAE  Final      Susceptibility   Klebsiella pneumoniae - MIC*    AMPICILLIN >=32 RESISTANT Resistant     CEFEPIME <=0.12 SENSITIVE Sensitive     CEFTAZIDIME <=1 SENSITIVE Sensitive     CEFTRIAXONE <=0.25 SENSITIVE Sensitive     CIPROFLOXACIN <=0.25 SENSITIVE Sensitive     GENTAMICIN <=1 SENSITIVE Sensitive     IMIPENEM <=0.25 SENSITIVE Sensitive     TRIMETH/SULFA <=20 SENSITIVE Sensitive     AMPICILLIN/SULBACTAM 4 SENSITIVE Sensitive     PIP/TAZO <=4 SENSITIVE Sensitive ug/mL   Klebsiella pneumoniae - KIRBY BAUER*    CEFAZOLIN SENSITIVE Sensitive     * KLEBSIELLA  PNEUMONIAE    KLEBSIELLA PNEUMONIAE  Blood Culture ID Panel (Reflexed)     Status: Abnormal   Collection Time: 05/28/23  5:50 PM  Result Value Ref Range Status   Enterococcus faecalis NOT DETECTED NOT DETECTED Final   Enterococcus Faecium NOT DETECTED NOT DETECTED Final   Listeria monocytogenes NOT DETECTED NOT DETECTED Final   Staphylococcus species NOT DETECTED NOT DETECTED Final   Staphylococcus aureus (BCID) NOT DETECTED NOT DETECTED Final   Staphylococcus epidermidis NOT DETECTED NOT DETECTED Final   Staphylococcus lugdunensis NOT DETECTED NOT DETECTED Final   Streptococcus species NOT DETECTED NOT DETECTED Final   Streptococcus agalactiae NOT DETECTED NOT DETECTED Final   Streptococcus pneumoniae NOT DETECTED NOT DETECTED Final   Streptococcus pyogenes NOT DETECTED NOT DETECTED Final   A.calcoaceticus-baumannii NOT DETECTED NOT DETECTED Final   Bacteroides fragilis NOT DETECTED NOT DETECTED Final   Enterobacterales DETECTED (A) NOT DETECTED Final    Comment: Enterobacterales represent a large order of gram negative bacteria, not a single organism. CRITICAL RESULT CALLED TO, READ BACK BY AND VERIFIED WITH: PHARMD JCorey Skains [id number] @ 1535 FH    Enterobacter cloacae complex NOT DETECTED NOT DETECTED Final   Escherichia coli NOT DETECTED NOT DETECTED Final   Klebsiella aerogenes NOT DETECTED NOT DETECTED Final   Klebsiella oxytoca NOT DETECTED NOT DETECTED Final   Klebsiella pneumoniae DETECTED (A) NOT DETECTED Final    Comment: CRITICAL RESULT CALLED TO, READ BACK BY AND VERIFIED WITH: PHARMD JCorey Skains [id number] @ 1535 FH    Proteus species NOT DETECTED NOT DETECTED Final   Salmonella species NOT DETECTED NOT DETECTED Final   Serratia marcescens NOT DETECTED NOT DETECTED Final   Haemophilus influenzae NOT DETECTED NOT DETECTED Final   Neisseria meningitidis NOT DETECTED NOT DETECTED Final   Pseudomonas aeruginosa NOT DETECTED NOT DETECTED Final   Stenotrophomonas maltophilia NOT  DETECTED NOT DETECTED Final   Candida albicans NOT DETECTED NOT DETECTED Final   Candida auris NOT DETECTED NOT DETECTED Final   Candida glabrata NOT DETECTED NOT DETECTED Final   Candida krusei NOT DETECTED NOT DETECTED Final   Candida parapsilosis NOT DETECTED NOT DETECTED Final   Candida tropicalis NOT DETECTED NOT DETECTED Final   Cryptococcus neoformans/gattii NOT DETECTED NOT DETECTED Final   CTX-M ESBL NOT DETECTED NOT DETECTED Final   Carbapenem resistance IMP NOT DETECTED NOT DETECTED Final   Carbapenem resistance KPC NOT DETECTED NOT DETECTED Final   Carbapenem resistance NDM NOT DETECTED NOT DETECTED Final   Carbapenem resist OXA 48 LIKE NOT DETECTED NOT DETECTED Final   Carbapenem resistance VIM NOT DETECTED NOT DETECTED  Final    Comment: Performed at [facility], [address], [place], [place] [address]  Resp panel by RT-PCR (RSV, Flu A&B, Covid)     Status: None   Collection Time: 05/28/23  6:15 PM   Specimen: Nasal Swab  Result Value Ref Range Status   SARS Coronavirus 2 by RT PCR NEGATIVE NEGATIVE Final    Comment: (NOTE) SARS-CoV-2 target nucleic acids are NOT DETECTED.  The SARS-CoV-2 RNA is generally detectable in upper respiratory specimens during the acute phase of infection. The lowest concentration of SARS-CoV-2 viral copies this assay can detect is 138 copies/mL. A negative result does not preclude SARS-Cov-2 infection and should not be used as the sole basis for treatment or other patient management decisions. A negative result may occur with  improper specimen collection/handling, submission of specimen other than nasopharyngeal swab, presence of viral mutation(s) within the areas targeted by this assay, and inadequate number of viral copies(<138 copies/mL). A negative result must be combined with clinical observations, patient history, and epidemiological information. The expected result is Negative.  Fact Sheet for Patients:   BloggerCourse.com  Fact Sheet for Healthcare Providers:  SeriousBroker.it  This test is no t yet approved or cleared by the [place] FDA and  has been authorized for detection and/or diagnosis of SARS-CoV-2 by FDA under an Emergency Use Authorization (EUA). This EUA will remain  in effect (meaning this test can be used) for the duration of the COVID-19 declaration under Section 564(b)(1) of the Act, 21 U.S.C.section 360bbb-3(b)(1), unless the authorization is terminated  or revoked sooner.       Influenza A by PCR NEGATIVE NEGATIVE Final   Influenza B by PCR NEGATIVE NEGATIVE Final    Comment: (NOTE) The Xpert Xpress SARS-CoV-2/FLU/RSV plus assay is intended as an aid in the diagnosis of influenza from Nasopharyngeal swab specimens and should not be used as a sole basis for treatment. Nasal washings and aspirates are unacceptable for Xpert Xpress SARS-CoV-2/FLU/RSV testing.  Fact Sheet for Patients: BloggerCourse.com  Fact Sheet for Healthcare Providers: SeriousBroker.it  This test is not yet approved or cleared by the [place] FDA and has been authorized for detection and/or diagnosis of SARS-CoV-2 by FDA under an Emergency Use Authorization (EUA). This EUA will remain in effect (meaning this test can be used) for the duration of the COVID-19 declaration under Section 564(b)(1) of the Act, 21 U.S.C. section 360bbb-3(b)(1), unless the authorization is terminated or revoked.     Resp Syncytial Virus by PCR NEGATIVE NEGATIVE Final    Comment: (NOTE) Fact Sheet for Patients: BloggerCourse.com  Fact Sheet for Healthcare Providers: SeriousBroker.it  This test is not yet approved or cleared by the [place] FDA and has been authorized for detection and/or diagnosis of SARS-CoV-2 by FDA under an Emergency Use  Authorization (EUA). This EUA will remain in effect (meaning this test can be used) for the duration of the COVID-19 declaration under Section 564(b)(1) of the Act, 21 U.S.C. section 360bbb-3(b)(1), unless the authorization is terminated or revoked.  Performed at [facility], [address], [place], [place] [address]   Urine Culture     Status: Abnormal   Collection Time: 05/28/23  7:12 PM   Specimen: Urine, Random  Result Value Ref Range Status   Specimen Description   Final    URINE, RANDOM Performed at [facility], [address] [place], [place] [address]    Special Requests   Final    NONE Reflexed from  [id number] Performed at [facility], [address] [place], [place] [address]    Culture >=100,000 COLONIES/mL KLEBSIELLA PNEUMONIAE (A)  Final   Report Status 05/30/2023 FINAL  Final   Organism ID, Bacteria KLEBSIELLA PNEUMONIAE (A)  Final      Susceptibility   Klebsiella pneumoniae - MIC*    AMPICILLIN >=32 RESISTANT Resistant     CEFAZOLIN <=4 SENSITIVE Sensitive     CEFEPIME <=0.12 SENSITIVE Sensitive     CEFTRIAXONE <=0.25 SENSITIVE Sensitive     CIPROFLOXACIN <=0.25 SENSITIVE Sensitive     GENTAMICIN <=1 SENSITIVE Sensitive     IMIPENEM <=0.25 SENSITIVE Sensitive     NITROFURANTOIN <=16 SENSITIVE Sensitive     TRIMETH/SULFA <=20 SENSITIVE Sensitive     AMPICILLIN/SULBACTAM 4 SENSITIVE Sensitive     PIP/TAZO <=4 SENSITIVE Sensitive ug/mL    * >=100,000 COLONIES/mL KLEBSIELLA PNEUMONIAE    Labs: CBC: Recent Labs  Lab 05/28/23 1815 05/29/23 0848 05/30/23 0613 05/31/23 0332  WBC 34.4* 31.7* 21.0* 10.8*  NEUTROABS 28.9*  --   --   --   HGB 13.7 11.5* 10.5* 11.3*  HCT 39.6 34.2* 31.6* 34.0*  MCV 84.3 85.7 85.2 86.1  PLT 163 158 169 210   Basic Metabolic Panel: Recent Labs  Lab 05/28/23 1815 05/29/23 0848 05/30/23 0613 05/31/23 0332  NA 133* 137 134* 138  K 3.9 4.3 3.8 4.2  CL 98 105 99  101  CO2 21* 21* 22 24  GLUCOSE 253* 203* 251* 177*  BUN 25* 17 25* 17  CREATININE 1.92* 1.17 1.07 1.14  CALCIUM 8.6* 8.0* 7.7* 8.2*   Liver Function Tests: Recent Labs  Lab 05/28/23 1815 05/29/23 0848 05/30/23 0613 05/31/23 0332  AST 45* 22 19 24   ALT 27 22 21 23   ALKPHOS 205* 118 117 148*  BILITOT 1.2 0.9 0.6 0.6  PROT 6.7 5.3* 5.1* 5.5*  ALBUMIN 3.2* 2.2* 2.0* 2.1*   CBG: Recent Labs  Lab 05/30/23 0811 05/30/23 1223 05/30/23 1635 05/30/23 2126 05/31/23 1029  GLUCAP 263* 267* 157* 143* 273*    Discharge time spent: less than 30 minutes.  Signed: Rickey Barbara, MD [facility] 05/31/2023

## 2023-06-02 ENCOUNTER — Other Ambulatory Visit (HOSPITAL_COMMUNITY): Payer: Self-pay

## 2023-06-05 LAB — GLUCOSE, CAPILLARY
Glucose-Capillary: 204 mg/dL — ABNORMAL HIGH (ref 70–99)
Glucose-Capillary: 338 mg/dL — ABNORMAL HIGH (ref 70–99)

## 2023-06-09 ENCOUNTER — Telehealth: Payer: Self-pay | Admitting: Urology

## 2023-06-09 NOTE — Telephone Encounter (Signed)
 Pt called about a follow up that he was supposed to have in 2 weeks to have the stent removed after his surgery on 05/29/23. I put him on next available which is 06/19/23 just wanted to be sure that was what you wanted. Please advise.

## 2023-06-19 ENCOUNTER — Ambulatory Visit (HOSPITAL_BASED_OUTPATIENT_CLINIC_OR_DEPARTMENT_OTHER)
Admission: RE | Admit: 2023-06-19 | Discharge: 2023-06-19 | Disposition: A | Discharge status: Home / Self Care | Source: Ambulatory Visit | Attending: Urology | Admitting: Urology

## 2023-06-19 ENCOUNTER — Encounter: Payer: Self-pay | Admitting: Urology

## 2023-06-19 ENCOUNTER — Other Ambulatory Visit: Payer: Self-pay | Admitting: Urology

## 2023-06-19 ENCOUNTER — Ambulatory Visit: Admitting: Urology

## 2023-06-19 VITALS — BP 127/66 | HR 58 | Ht 70.0 in | Wt 134.0 lb

## 2023-06-19 DIAGNOSIS — N201 Calculus of ureter: Secondary | ICD-10-CM | POA: Insufficient documentation

## 2023-06-19 DIAGNOSIS — Z87440 Personal history of urinary (tract) infections: Secondary | ICD-10-CM | POA: Diagnosis not present

## 2023-06-19 LAB — URINALYSIS, ROUTINE W REFLEX MICROSCOPIC
Bilirubin, UA: NEGATIVE
Glucose, UA: NEGATIVE
Leukocytes,UA: NEGATIVE
Nitrite, UA: NEGATIVE
Specific Gravity, UA: 1.015 (ref 1.005–1.030)
Urobilinogen, Ur: 0.2 mg/dL (ref 0.2–1.0)
pH, UA: 5.5 (ref 5.0–7.5)

## 2023-06-19 LAB — MICROSCOPIC EXAMINATION: RBC, Urine: >30 /HPF — AB (ref 0–2)

## 2023-06-19 MED ORDER — CEPHALEXIN 500 MG PO CAPS: 500.0000 mg | ORAL_CAPSULE | Freq: Every day | ORAL | 0 refills | Status: DC

## 2023-06-19 NOTE — Progress Notes (Signed)
 Assessment: 1. Ureteral calculus, left   2. History of UTI     Plan: KUB reviewed today.  The left ureteral stent is in good position.  There is a 5 x 7 mm calcification noted at the distal aspect of the stent. Urine culture sent today. Begin daily Keflex  for UTI prevention. I discussed options for management of the distal left ureteral calculus including shockwave lithotripsy and ureteroscopic laser lithotripsy.  The risk and benefits of each procedure discussed.  Following our discussion, he would like to proceed with left ureteroscopic laser lithotripsy and stent exchange.  Risks, benefits, alternatives were discussed with the patient in detail.  Potential risks including, but not limited to, infection; bleeding;  injury to urethra, bladder, or ureter; possible need of other treatments; possible failure to remove the calculus; ureteral  stricture formation; cardiac, pulmonary, cerebrovascular events; and anesthetic complications were discussed.  The patient understands and wishes to proceed.  Procedure: The patient will be scheduled for cystoscopy, left ureteroscopic laser lithotripsy, exchange of left ureteral stent at [facility].  Surgical request is placed with the surgery schedulers and will be scheduled at the patient's/family request. Informed consent is given as documented below. Anesthesia: General  The patient does not have sleep apnea, history of MRSA, history of VRE, history of cardiac device requiring special anesthetic needs. Patient is stable and considered clear for surgical management in an outpatient ambulatory surgery setting as well as inpatient hospital setting.  Consent for Operation or Procedure: Provider Certification I hereby certify that the nature, purpose, benefits, usual and most frequent risks of, and alternatives to, the operation or procedure have been explained to the patient (or person authorized to sign for the patient) either by me as responsible  physician or by the provider who is to perform the operation or procedure. Time spent such that the patient/family has had an opportunity to ask questions, and that those questions have been answered. The patient or the patient's representative has been advised that selected tasks may be performed by assistants to the primary health care provider(s). I believe that the patient (or person authorized to sign for the patient) understands what has been explained, and has consented to the operation or procedure. No guarantees were implied or made.  Chief Complaint: Chief Complaint  Patient presents with   Nephrolithiasis    HPI: Charles Strong is a 83 y.o. male who presents for continued evaluation of left ureteral calculus. He presented to the emergency room on 05/29/23 with dehydration and weakness.  He reported fever, chills, left flank pain and abdominal pain.  Evaluation demonstrated a white blood cell count of 34.4K, lactate of 2.9, and creatinine of 1.92.  Urinalysis showed >50 RBCs, >50 WBCs, and many bacteria.  CT abdomen and pelvis with contrast showed an 8 mm calculus in the distal left ureter with mild left hydronephrosis, thick-walled bladder, and enlarged prostate.  He was hypotensive and tachycardic in the emergency room.  He received IV antibiotics as well as IV fluids. He underwent cystoscopy, left retrograde pyelogram, and left ureteral stent placement on 05/29/23. Urine culture grew >100K Klebsiella.   He returns today for follow-up.  He has completed his antibiotics.  He is overall doing well.  He is having some stent related symptoms.  He has had some intermittent gross hematuria. No fever or chills.  Portions of the above documentation were copied from a prior visit for review purposes only.  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   Thyroid  disease     PSH: Past Surgical History:  Procedure Laterality Date   CYSTOSCOPY W/ URETERAL STENT PLACEMENT Left 05/29/2023   Procedure: CYSTOSCOPY, WITH RETROGRADE PYELOGRAM AND URETERAL STENT INSERTION;  Surgeon: Mellie Sprinkle., MD;  Location: [facility] OR;  Service: Urology;  Laterality: Left;   TONSILLECTOMY      SH: Social History   Tobacco Use   Smoking status: Never   Smokeless tobacco: Never  Substance Use Topics   Alcohol  use: No   Drug use: No    ROS: Constitutional:  Negative for fever, chills, weight loss CV: Negative for chest pain, previous MI, hypertension Respiratory:  Negative for shortness of breath, wheezing, sleep apnea, frequent cough GI:  Negative for nausea, vomiting, bloody stool, GERD  PE: BP 127/66   Pulse (!) 58   Ht 5\' 10"  (1.778 m)   Wt 134 lb (60.8 kg)   BMI 19.23 kg/m  GENERAL APPEARANCE:  Well appearing, well developed, well nourished, NAD HEENT:  Atraumatic, normocephalic, oropharynx clear NECK:  Supple without lymphadenopathy or thyromegaly ABDOMEN:  Soft, non-tender, no masses EXTREMITIES:  Moves all extremities well, without clubbing, cyanosis, or edema NEUROLOGIC:  Alert and oriented x 3, normal gait, CN II-XII grossly intact MENTAL STATUS:  appropriate BACK:  Non-tender to palpation, No CVAT SKIN:  Warm, dry, and intact   Results: U/A: 0-5 WBCs, >30 RBCs, few bacteria, nitrite negative

## 2023-06-19 NOTE — H&P (View-Only) (Signed)
 Assessment: 1. Ureteral calculus, left   2. History of UTI     Plan: KUB reviewed today.  The left ureteral stent is in good position.  There is a 5 x 7 mm calcification noted at the distal aspect of the stent. Urine culture sent today. Begin daily Keflex  for UTI prevention. I discussed options for management of the distal left ureteral calculus including shockwave lithotripsy and ureteroscopic laser lithotripsy.  The risk and benefits of each procedure discussed.  Following our discussion, he would like to proceed with left ureteroscopic laser lithotripsy and stent exchange.  Risks, benefits, alternatives were discussed with the patient in detail.  Potential risks including, but not limited to, infection; bleeding;  injury to urethra, bladder, or ureter; possible need of other treatments; possible failure to remove the calculus; ureteral  stricture formation; cardiac, pulmonary, cerebrovascular events; and anesthetic complications were discussed.  The patient understands and wishes to proceed.  Procedure: The patient will be scheduled for cystoscopy, left ureteroscopic laser lithotripsy, exchange of left ureteral stent at [facility].  Surgical request is placed with the surgery schedulers and will be scheduled at the patient's/family request. Informed consent is given as documented below. Anesthesia: General  The patient does not have sleep apnea, history of MRSA, history of VRE, history of cardiac device requiring special anesthetic needs. Patient is stable and considered clear for surgical management in an outpatient ambulatory surgery setting as well as inpatient hospital setting.  Consent for Operation or Procedure: Provider Certification I hereby certify that the nature, purpose, benefits, usual and most frequent risks of, and alternatives to, the operation or procedure have been explained to the patient (or person authorized to sign for the patient) either by me as responsible  physician or by the provider who is to perform the operation or procedure. Time spent such that the patient/family has had an opportunity to ask questions, and that those questions have been answered. The patient or the patient's representative has been advised that selected tasks may be performed by assistants to the primary health care provider(s). I believe that the patient (or person authorized to sign for the patient) understands what has been explained, and has consented to the operation or procedure. No guarantees were implied or made.  Chief Complaint: Chief Complaint  Patient presents with   Nephrolithiasis    HPI: Charles Strong is a 83 y.o. male who presents for continued evaluation of left ureteral calculus. He presented to the emergency room on 05/29/23 with dehydration and weakness.  He reported fever, chills, left flank pain and abdominal pain.  Evaluation demonstrated a white blood cell count of 34.4K, lactate of 2.9, and creatinine of 1.92.  Urinalysis showed >50 RBCs, >50 WBCs, and many bacteria.  CT abdomen and pelvis with contrast showed an 8 mm calculus in the distal left ureter with mild left hydronephrosis, thick-walled bladder, and enlarged prostate.  He was hypotensive and tachycardic in the emergency room.  He received IV antibiotics as well as IV fluids. He underwent cystoscopy, left retrograde pyelogram, and left ureteral stent placement on 05/29/23. Urine culture grew >100K Klebsiella.   He returns today for follow-up.  He has completed his antibiotics.  He is overall doing well.  He is having some stent related symptoms.  He has had some intermittent gross hematuria. No fever or chills.  Portions of the above documentation were copied from a prior visit for review purposes only.  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   Thyroid  disease     PSH: Past Surgical History:  Procedure Laterality Date   CYSTOSCOPY W/ URETERAL STENT PLACEMENT Left 05/29/2023   Procedure: CYSTOSCOPY, WITH RETROGRADE PYELOGRAM AND URETERAL STENT INSERTION;  Surgeon: Mellie Sprinkle., MD;  Location: [facility] OR;  Service: Urology;  Laterality: Left;   TONSILLECTOMY      SH: Social History   Tobacco Use   Smoking status: Never   Smokeless tobacco: Never  Substance Use Topics   Alcohol  use: No   Drug use: No    ROS: Constitutional:  Negative for fever, chills, weight loss CV: Negative for chest pain, previous MI, hypertension Respiratory:  Negative for shortness of breath, wheezing, sleep apnea, frequent cough GI:  Negative for nausea, vomiting, bloody stool, GERD  PE: BP 127/66   Pulse (!) 58   Ht 5\' 10"  (1.778 m)   Wt 134 lb (60.8 kg)   BMI 19.23 kg/m  GENERAL APPEARANCE:  Well appearing, well developed, well nourished, NAD HEENT:  Atraumatic, normocephalic, oropharynx clear NECK:  Supple without lymphadenopathy or thyromegaly ABDOMEN:  Soft, non-tender, no masses EXTREMITIES:  Moves all extremities well, without clubbing, cyanosis, or edema NEUROLOGIC:  Alert and oriented x 3, normal gait, CN II-XII grossly intact MENTAL STATUS:  appropriate BACK:  Non-tender to palpation, No CVAT SKIN:  Warm, dry, and intact   Results: U/A: 0-5 WBCs, >30 RBCs, few bacteria, nitrite negative

## 2023-06-21 ENCOUNTER — Encounter: Payer: Self-pay | Admitting: Urology

## 2023-06-21 LAB — URINE CULTURE: Organism ID, Bacteria: NO GROWTH

## 2023-06-23 ENCOUNTER — Ambulatory Visit: Payer: Self-pay | Admitting: Urology

## 2023-06-23 DIAGNOSIS — N201 Calculus of ureter: Secondary | ICD-10-CM

## 2023-06-24 ENCOUNTER — Ambulatory Visit: Attending: Nurse Practitioner | Admitting: Nurse Practitioner

## 2023-06-24 ENCOUNTER — Encounter: Payer: Self-pay | Admitting: Nurse Practitioner

## 2023-06-24 VITALS — BP 112/56 | HR 58 | Ht 70.0 in | Wt 134.8 lb

## 2023-06-24 DIAGNOSIS — E063 Autoimmune thyroiditis: Secondary | ICD-10-CM

## 2023-06-24 DIAGNOSIS — R7989 Other specified abnormal findings of blood chemistry: Secondary | ICD-10-CM | POA: Diagnosis not present

## 2023-06-24 DIAGNOSIS — E119 Type 2 diabetes mellitus without complications: Secondary | ICD-10-CM | POA: Diagnosis not present

## 2023-06-24 DIAGNOSIS — E782 Mixed hyperlipidemia: Secondary | ICD-10-CM

## 2023-06-24 DIAGNOSIS — Q245 Malformation of coronary vessels: Secondary | ICD-10-CM

## 2023-06-24 NOTE — Progress Notes (Unsigned)
 Office Visit    Patient Name: Charles Strong Date of Encounter: 06/24/2023  Primary Care Provider:  Dorena Gander, MD Primary Cardiologist:  Maudine Sos, MD  Chief Complaint    83 year old male with a history of elevated troponin, myocardial bridge, hyperlipidemia, type 2 diabetes, Hashimoto's thyroiditis, and BPH who presents for hospital follow-up related to elevated troponin.  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   Thyroid  disease    Past Surgical History:  Procedure Laterality Date   CYSTOSCOPY W/ URETERAL STENT PLACEMENT Left 05/29/2023   Procedure: CYSTOSCOPY, WITH RETROGRADE PYELOGRAM AND URETERAL STENT INSERTION;  Surgeon: Mellie Sprinkle., MD;  Location: [facility] OR;  Service: Urology;  Laterality: Left;   TONSILLECTOMY      Allergies  Allergies  Allergen Reactions   Ciprofloxacin Hives and Nausea And Vomiting   Tramadol Nausea And Vomiting and Other (See Comments)    Blackouts,chills,    Calcium Fortified Cookie [Nutritional Supplements] Diarrhea   Egg-Derived Products Diarrhea   Metformin And Related Diarrhea     Labs/Other Studies Reviewed    The following studies were reviewed today:  Cardiac Studies & Procedures   ______________________________________________________________________________________________   STRESS TESTS  EXERCISE TOLERANCE TEST (ETT) 11/30/2014  Narrative  Blood pressure demonstrated a hypertensive response to exercise.  Horizontal ST segment depression ST segment depression was noted during stress in the II, III and aVF leads, beginning at 10 minutes of stress, and returning to baseline after less than 1 minute of recovery.   ECHOCARDIOGRAM  ECHOCARDIOGRAM COMPLETE  05/16/2023  Narrative ECHOCARDIOGRAM REPORT    Patient Name:   Charles Strong Date of Exam: 05/16/2023 Medical Rec #:  [id number]         Height:       70.0 in Accession #:    [id number]        Weight:       143.2 lb Date of Birth:  1941-01-21         BSA:          1.811 m Patient Age:    82 years          BP:           125/71 mmHg Patient Gender: M                 HR:           64 bpm. Exam Location:  Inpatient  Procedure: 2D Echo, Cardiac Doppler and Color Doppler (Both Spectral and Color Flow Doppler were utilized during procedure).  Indications:    Elevated Troponin  History:        Patient has no prior history of Echocardiogram examinations. Signs/Symptoms:Chest Pain; Risk Factors:Diabetes and Dyslipidemia.  Sonographer:    Terrilee Few RCS Referring Phys: [id number] DEBBY CROSLEY  IMPRESSIONS   1. Left ventricular ejection fraction, by estimation, is 60 to 65%. Left ventricular ejection fraction by PLAX is 61 %. The left ventricle has normal function. The left ventricle has no regional wall motion abnormalities. Left ventricular diastolic parameters are consistent with Grade I diastolic dysfunction (impaired relaxation). 2. Right ventricular systolic function is normal. The right ventricular size is normal. The estimated right ventricular systolic pressure is 20.0 mmHg. 3. The mitral  valve is grossly normal. Trivial mitral valve regurgitation. 4. The aortic valve is tricuspid. Aortic valve regurgitation is not visualized. 5. The inferior vena cava is dilated in size with >50% respiratory variability, suggesting right atrial pressure of 8 mmHg.  Comparison(s): No prior Echocardiogram.  FINDINGS Left Ventricle: Left ventricular ejection fraction, by estimation, is 60 to 65%. Left ventricular ejection fraction by PLAX is 61 %. The left ventricle has normal function. The left ventricle has no regional wall motion abnormalities. The left ventricular internal cavity size was  normal in size. There is no left ventricular hypertrophy. Left ventricular diastolic parameters are consistent with Grade I diastolic dysfunction (impaired relaxation). Indeterminate filling pressures.  Right Ventricle: The right ventricular size is normal. No increase in right ventricular wall thickness. Right ventricular systolic function is normal. The tricuspid regurgitant velocity is 1.73 m/s, and with an assumed right atrial pressure of 8 mmHg, the estimated right ventricular systolic pressure is 20.0 mmHg.  Left Atrium: Left atrial size was normal in size.  Right Atrium: Right atrial size was normal in size.  Pericardium: There is no evidence of pericardial effusion.  Mitral Valve: The mitral valve is grossly normal. Trivial mitral valve regurgitation.  Tricuspid Valve: The tricuspid valve is grossly normal. Tricuspid valve regurgitation is trivial.  Aortic Valve: The aortic valve is tricuspid. Aortic valve regurgitation is not visualized. Aortic valve peak gradient measures 6.2 mmHg.  Pulmonic Valve: The pulmonic valve was normal in structure. Pulmonic valve regurgitation is not visualized.  Aorta: The aortic root and ascending aorta are structurally normal, with no evidence of dilitation.  Venous: The inferior vena cava is dilated in size with greater than 50% respiratory variability, suggesting right atrial pressure of 8 mmHg.  IAS/Shunts: No atrial level shunt detected by color flow Doppler.   LEFT VENTRICLE PLAX 2D LV EF:         Left            Diastology ventricular     LV e' medial:    7.72 cm/s ejection        LV E/e' medial:  7.3 fraction by     LV e' lateral:   9.46 cm/s PLAX is 61      LV E/e' lateral: 6.0 %. LVIDd:         4.30 cm LVIDs:         2.90 cm LV PW:         0.70 cm LV IVS:        0.80 cm LVOT diam:     2.00 cm LV SV:         53 LV SV Index:   29 LVOT Area:     3.14 cm   RIGHT VENTRICLE             IVC RV S prime:     11.30 cm/s  IVC diam:  2.40 cm TAPSE (M-mode): 2.2 cm  LEFT ATRIUM             Index        RIGHT ATRIUM           Index LA diam:        3.60 cm 1.99 cm/m   RA Area:     15.00 cm LA Vol (A2C):   44.0 ml 24.30 ml/m  RA Volume:   34.60 ml  19.11 ml/m LA Vol (A4C):   30.2 ml 16.68 ml/m LA Biplane Vol: 39.2 ml 21.65 ml/m AORTIC VALVE AV Area (  Vmax): 2.18 cm AV Vmax:        124.00 cm/s AV Peak Grad:   6.2 mmHg LVOT Vmax:      85.90 cm/s LVOT Vmean:     56.600 cm/s LVOT VTI:       0.170 m  AORTA Ao Root diam: 3.30 cm Ao Asc diam:  3.00 cm  MITRAL VALVE               TRICUSPID VALVE MV Area (PHT): 3.99 cm    TR Peak grad:   12.0 mmHg MV Decel Time: 190 msec    TR Vmax:        173.00 cm/s MV E velocity: 56.60 cm/s MV A velocity: 59.70 cm/s  SHUNTS MV E/A ratio:  0.95        Systemic VTI:  0.17 m Systemic Diam: 2.00 cm  Dinah Franco MD Electronically signed by Dinah Franco MD Signature Date/Time: 05/16/2023/5:54:39 PM    Final      CT SCANS  CT CORONARY MORPH W/CTA COR W/SCORE 01/27/2015  Addendum 01/29/2015  4:17 PM ADDENDUM REPORT: 01/29/2015 16:15 CLINICAL DATA:  Chest pain EXAM: Cardiac/Coronary  CT TECHNIQUE: The patient was scanned on a Philips 256 scanner. FINDINGS: A 120 kV prospective scan was triggered in the descending thoracic aorta at 111 HU's. Axial non-contrast 3 mm slices were carried out through the heart. The data set was analyzed on a dedicated work station and scored using the Agatson method. Gantry rotation speed was 270 msecs and collimation was .9 mm. 5 mg of iv metoprolol  and 0.8 mg of sl NTG was given. The 3D data set was reconstructed in 5% intervals of the 67-82 % of the R-R cycle. Diastolic phases were analyzed on a dedicated work station using MPR, MIP and VRT modes. The patient received 80 cc of contrast twice as the scanner failed to acquire images after the first injection. Aorta:  Normal caliber.  No calcifications.  No dissection. Aortic Valve:   Trileaflet.  No calcifications. Coronary Arteries:  Normal origin.  Right dominance. RCA is a large caliber vessel that gives rise to PDA and PLA. There is minimal non-calcified plaque in the ostial RCA with associated stenosis 0-25%. Left main gives rise to LAD and LCX arteries. Left main ostium has slit-like appearance and its course is angled, associated with minimal stenosis 0-25% stenosis. LAD is a medium caliber vessel that gives rise to two diagonal branches. There is mild non-calcified plaque in the proximal portion associated with 25-50% stenosis. Mid to distal LAD has a long 12 mm intramyocardial course (bridging) after which the lumen is very small. D1 is a large vessel with no plaque. D2 is a very small vessel. LCX is a medium size non-dominant vessel that gives rise to one obtuse marginal branch. There is no plaque. IMPRESSION: 1. Coronary calcium score of 0. This was 0 percentile for age and sex matched control. 2. Normal coronary origin.  Right dominance. 3.  Minimal non-calcified plaque. 4. 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 This is most probably culprit of patient's symptoms. Charles Strong Electronically Signed By: Charles Strong On: 01/29/2015 16:15  Narrative EXAM: OVER-READ INTERPRETATION  CT CHEST  The following report is an over-read performed by radiologist Dr. Babs Bolster [facility] on 01/27/2015. This over-read does not include interpretation of cardiac or coronary anatomy or pathology. The coronary calcium score/coronary CTA interpretation by the cardiologist is attached.  COMPARISON:  Chest CT 08/30/2013.  FINDINGS: 4  mm subpleural nodule associated with the right major fissure (image 11 of series 404) is unchanged compared to prior study 08/30/2013, considered benign, presumably a subpleural lymph node. Likewise, a 5 mm nodule in the left lower lobe (image 39 of series 404) is also unchanged. Within the visualized  portions of the thorax there is no acute consolidative airspace disease, no pneumothorax, no pleural effusions and no lymphadenopathy. Visualized portions of the upper abdomen are unremarkable. There are no aggressive appearing lytic or blastic lesions noted in the visualized portions of the skeleton.  IMPRESSION: 1. Small pulmonary nodules in lungs bilaterally are unchanged compared to prior study 08/30/2013, considered benign.  Electronically Signed: By: Alexandria Angel M.D. On: 01/27/2015 12:10     ______________________________________________________________________________________________     Recent Labs: 05/16/2023: Magnesium 2.3 05/28/2023: TSH 9.920 05/31/2023: ALT 23; BUN 17; Creatinine, Ser 1.14; Hemoglobin 11.3; Platelets 210; Potassium 4.2; Sodium 138  Recent Lipid Panel    Component Value Date/Time   CHOL 163 12/19/2014 0913   TRIG 73 12/19/2014 0913   HDL 63 12/19/2014 0913   CHOLHDL 2.6 12/19/2014 0913   VLDL 15 12/19/2014 0913   LDLCALC 85 12/19/2014 0913    History of Present Illness    83 year old male with the above past medical history including elevated troponin, myocardial bridge, hyperlipidemia, type 2 diabetes, Hashimoto's thyroiditis, and BPH.  ETT in 2016 was concerning for ischemia.  He subsequently declined cardiac catheterization.  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  He was started on metoprolol .  He has a prior episode of syncope.  This was thought to be due to taking both Imdur  and Cialis at the same time.  He was last seen in the office on 01/18/2020 and was doing well.  He was running 5 miles every other day and tolerating this well.  He was hospitalized in March 2025 in the setting of acute left-sided pyelonephritis. Cardiology was consulted in the setting of elevated troponin. Echocardiogram at the time showed EF 60 to 65%, G1 DD.  He denied chest pain.  Was felt  that his troponin was likely due to demand ischemia in the setting of sepsis/AKI.  Given prior reassuring cardiac workup, normal echocardiogram and lack of symptoms, further ischemic evaluation was deferred.  He was readmitted from 05/28/2023 to 05/31/2023 in the setting of sepsis with acute renal failure, septic shock secondary to acute cystitis (complicated Klebsiella UTI), left ureteral calculus, AKI.  Urology was consulted, he underwent ureteral stent placement.  He was discharged home in stable condition on 05/31/2023.  He presents today for follow-up.  Since his recent hospitalizations done well from a cardiac standpoint.  He is walking 2 to 3 miles a day, he denies any symptoms concerning for angina.  He has not gotten back to running as he is waiting for his stent removal.  This is scheduled for 07/08/2023.  He notes occasional lightheadedness since taking his antibiotic, he denies any presyncope or syncope.  Denies any palpitations, dyspnea, edema, PND, with apnea weight gain.  Overall, he reports feeling well.  Follow-up in 4 months, sooner if needed.  Given lack of symptoms, through shared decision making, will defer an ischemic evaluation at this time. Elevated troponin/history of myocardial bridge: Hyperlipidemia: Type 2 diabetes: Hashimoto's thyroiditis: Sepsis: Disposition:  Home Medications    Current Outpatient Medications  Medication Sig Dispense Refill   Barberry-Oreg Grape-Goldenseal (BERBERINE COMPLEX PO) Take  1 capsule by mouth daily.     cephALEXin  (KEFLEX ) 500 MG capsule Take 1 capsule (500 mg total) by mouth daily for 21 days. 21 capsule 0   finasteride  (PROSCAR ) 5 MG tablet Take 5 mg by mouth daily.  11   fluticasone  (FLONASE ) 50 MCG/ACT nasal spray PLACE 2 SPRAYS IN EACH NOSTRIL DAILY AS NEEDED FOR ALLERGIES 48 g 0   glipiZIDE (GLUCOTROL XL) 5 MG 24 hr tablet Take 5 mg by mouth daily.     Magnesium Glycinate 100 MG CAPS Take 2 capsules by mouth daily.     Menaquinone-7  (VITAMIN K2) 100 MCG CAPS Take 1 capsule by mouth daily.     metFORMIN (GLUCOPHAGE-XR) 500 MG 24 hr tablet Take 500 mg by mouth daily with breakfast.     NITROSTAT  0.4 MG SL tablet PLACE 1 TABLET UNDER THE TONGUE EVERY 5 MINUTES AS NEEDED FOR CHEST PAIN 25 tablet 5   Selenium 200 MCG TABS Take 200 mcg by mouth daily.     thiamine (VITAMIN B-1) 100 MG tablet Take 100 mg by mouth daily.     vitamin B-12 (CYANOCOBALAMIN) 100 MCG tablet Take 100 mcg by mouth daily.     acetaminophen  (TYLENOL ) 500 MG tablet Take 1,000 mg by mouth every 6 (six) hours as needed for mild pain (pain score 1-3), fever or headache. (Patient not taking: Reported on 06/24/2023)     Cholecalciferol 250 MCG (10000 UT) CAPS Take 1 capsule by mouth daily.     ketorolac  (TORADOL ) 10 MG tablet Take 1 tablet (10 mg total) by mouth every 6 (six) hours as needed for moderate pain (pain score 4-6). (Patient not taking: Reported on 06/24/2023) 20 tablet 0   polyethylene glycol powder (GLYCOLAX /MIRALAX ) 17 GM/SCOOP powder Take 17 g by mouth as needed for mild constipation. (Patient not taking: Reported on 06/24/2023)     No current facility-administered medications for this visit.     Review of Systems    ***.  All other systems reviewed and are otherwise negative except as noted above.    Physical Exam    VS:  BP (!) 112/56 (BP Location: Left Arm, Patient Position: Sitting, Cuff Size: Normal)   Pulse (!) 58   Ht 5\' 10"  (1.778 m)   Wt 134 lb 12.8 oz (61.1 kg)   SpO2 99%   BMI 19.34 kg/m  GEN: Well nourished, well developed, in no acute distress. HEENT: normal. Neck: Supple, no JVD, carotid bruits, or masses. Cardiac: RRR, no murmurs, rubs, or gallops. No clubbing, cyanosis, edema.  Radials/DP/PT 2+ and equal bilaterally.  Respiratory:  Respirations regular and unlabored, clear to auscultation bilaterally. GI: Soft, nontender, nondistended, BS + x 4. MS: no deformity or atrophy. Skin: warm and dry, no rash. Neuro:  Strength  and sensation are intact. Psych: Normal affect.  Accessory Clinical Findings    ECG personally reviewed by me today -    - no EKG in office today.    Lab Results  Component Value Date   WBC 10.8 (H) 05/31/2023   HGB 11.3 (L) 05/31/2023   HCT 34.0 (L) 05/31/2023   MCV 86.1 05/31/2023   PLT 210 05/31/2023   Lab Results  Component Value Date   CREATININE 1.14 05/31/2023   BUN 17 05/31/2023   NA 138 05/31/2023   K 4.2 05/31/2023   CL 101 05/31/2023   CO2 24 05/31/2023   Lab Results  Component Value Date   ALT 23 05/31/2023   AST 24 05/31/2023  ALKPHOS 148 (H) 05/31/2023   BILITOT 0.6 05/31/2023   Lab Results  Component Value Date   CHOL 163 12/19/2014   HDL 63 12/19/2014   LDLCALC 85 12/19/2014   TRIG 73 12/19/2014   CHOLHDL 2.6 12/19/2014    Lab Results  Component Value Date   HGBA1C 7.3 (H) 05/16/2023    Assessment & Plan    1.  ***      Jude Norton, NP 06/24/2023, 4:33 PM

## 2023-06-24 NOTE — Patient Instructions (Addendum)
 Medication Instructions:  Your physician recommends that you continue on your current medications as directed. Please refer to the Current Medication list given to you today.  *If you need a refill on your cardiac medications before your next appointment, please call your pharmacy*  Lab Work: NONE ordered at this time of appointment   Testing/Procedures: NONE ordered at this time of appointment   Follow-Up: At [facility], you and your health needs are our priority.  As part of our continuing mission to provide you with exceptional heart care, our providers are all part of one team.  This team includes your primary Cardiologist (physician) and Advanced Practice Providers or APPs (Physician Assistants and Nurse Practitioners) who all work together to provide you with the care you need, when you need it.  Your next appointment:   4 month(s)  Provider:   Maudine Sos, MD or Marlana Silvan, NP          We recommend signing up for the patient portal called "MyChart".  Sign up information is provided on this After Visit Summary.  MyChart is used to connect with patients for Virtual Visits (Telemedicine).  Patients are able to view lab/test results, encounter notes, upcoming appointments, etc.  Non-urgent messages can be sent to your provider as well.   To learn more about what you can do with MyChart, go to [email or web address].

## 2023-06-25 ENCOUNTER — Encounter: Payer: Self-pay | Admitting: Nurse Practitioner

## 2023-06-26 NOTE — Patient Instructions (Signed)
 DUE TO COVID-19 ONLY TWO VISITORS  (aged 81 and older)  ARE ALLOWED TO COME WITH YOU AND STAY IN THE WAITING ROOM ONLY DURING PRE OP AND PROCEDURE.   **NO VISITORS ARE ALLOWED IN THE SHORT STAY AREA OR RECOVERY ROOM!!**  IF YOU WILL BE ADMITTED INTO THE HOSPITAL YOU ARE ALLOWED ONLY FOUR SUPPORT PEOPLE DURING VISITATION HOURS ONLY (7 AM -8PM)   The support person(s) must pass our screening, gel in and out, and wear a mask at all times, including in the patient's room. Patients must also wear a mask when staff or their support person are in the room. Visitors GUEST BADGE MUST BE WORN VISIBLY  One adult visitor may remain with you overnight and MUST be in the room by 8 P.M.     Your procedure is scheduled on: 07/08/23   Report to [facility] Main Entrance    Report to admitting at : 1:45 PM   Call this number if you have problems the morning of surgery [phone]   Do not eat food :After Midnight.   After Midnight you may have the following liquids until : 1:00 PM DAY OF SURGERY  Water Black Coffee (sugar ok, NO MILK/CREAM OR CREAMERS)  Tea (sugar ok, NO MILK/CREAM OR CREAMERS) regular and decaf                             Plain Jell-O (NO RED)                                           Fruit ices (not with fruit pulp, NO RED)                                     Popsicles (NO RED)                                                                  Juice: apple, WHITE grape, WHITE cranberry Sports drinks like Gatorade (NO RED)              FOLLOW AND ANY ADDITIONAL PRE OP INSTRUCTIONS YOU RECEIVED FROM YOUR SURGEON'S OFFICE!!!   Oral Hygiene is also important to reduce your risk of infection.                                    Remember - BRUSH YOUR TEETH THE MORNING OF SURGERY WITH YOUR REGULAR TOOTHPASTE  DENTURES WILL BE REMOVED PRIOR TO SURGERY PLEASE DO NOT APPLY "Poly grip" OR ADHESIVES!!!   Do NOT smoke after Midnight   Take these medicines the morning of surgery with  A SIP OF WATER: Cephalexin ,finesteride,silodosin.Tylenol  as needed.  DO NOT TAKE PM DOSE OF GLIPIZIDE THE DAY BEFORE SURGERY  DO NOT TAKE ANY ORAL DIABETIC MEDICATIONS DAY OF YOUR SURGERY  You may not have any metal on your body including hair pins, jewelry, and body piercing             Do not wear lotions, powders, perfumes/cologne, or deodorant              Men may shave face and neck.   Do not bring valuables to the hospital. [facility] IS NOT             RESPONSIBLE   FOR VALUABLES.   Contacts, glasses, or bridgework may not be worn into surgery.   Bring small overnight bag day of surgery.   DO NOT BRING YOUR HOME MEDICATIONS TO THE HOSPITAL. PHARMACY WILL DISPENSE MEDICATIONS LISTED ON YOUR MEDICATION LIST TO YOU DURING YOUR ADMISSION IN THE HOSPITAL!    Patients discharged on the day of surgery will not be allowed to drive home.  Someone NEEDS to stay with you for the first 24 hours after anesthesia.   Special Instructions: Bring a copy of your healthcare power of attorney and living will documents         the day of surgery if you haven't scanned them before.              Please read over the following fact sheets you were given: IF YOU HAVE QUESTIONS ABOUT YOUR PRE-OP INSTRUCTIONS PLEASE CALL [phone]    [facility] - Preparing for Surgery Before surgery, you can play an important role.  Because skin is not sterile, your skin needs to be as free of germs as possible.  You can reduce the number of germs on your skin by washing with CHG (chlorahexidine gluconate) soap before surgery.  CHG is an antiseptic cleaner which kills germs and bonds with the skin to continue killing germs even after washing. Please DO NOT use if you have an allergy to CHG or antibacterial soaps.  If your skin becomes reddened/irritated stop using the CHG and inform your nurse when you arrive at Short Stay. Do not shave (including legs and underarms) for at least 48 hours  prior to the first CHG shower.  You may shave your face/neck. Please follow these instructions carefully:  1.  Shower with CHG Soap the night before surgery and the  morning of Surgery.  2.  If you choose to wash your hair, wash your hair first as usual with your  normal  shampoo.  3.  After you shampoo, rinse your hair and body thoroughly to remove the  shampoo.                           4.  Use CHG as you would any other liquid soap.  You can apply chg directly  to the skin and wash                       Gently with a scrungie or clean washcloth.  5.  Apply the CHG Soap to your body ONLY FROM THE NECK DOWN.   Do not use on face/ open                           Wound or open sores. Avoid contact with eyes, ears mouth and genitals (private parts).                       Wash face,  Genitals (private parts) with your  normal soap.             6.  Wash thoroughly, paying special attention to the area where your surgery  will be performed.  7.  Thoroughly rinse your body with warm water from the neck down.  8.  DO NOT shower/wash with your normal soap after using and rinsing off  the CHG Soap.                9.  Pat yourself dry with a clean towel.            10.  Wear clean pajamas.            11.  Place clean sheets on your bed the night of your first shower and do not  sleep with pets. Day of Surgery : Do not apply any lotions/deodorants the morning of surgery.  Please wear clean clothes to the hospital/surgery center.  FAILURE TO FOLLOW THESE INSTRUCTIONS MAY RESULT IN THE CANCELLATION OF YOUR SURGERY PATIENT SIGNATURE_________________________________  NURSE SIGNATURE__________________________________  ________________________________________________________________________

## 2023-07-02 ENCOUNTER — Other Ambulatory Visit: Payer: Self-pay

## 2023-07-02 ENCOUNTER — Encounter (HOSPITAL_COMMUNITY)
Admission: RE | Admit: 2023-07-02 | Discharge: 2023-07-02 | Disposition: A | Discharge status: Home / Self Care | Source: Ambulatory Visit | Attending: Urology | Admitting: Urology

## 2023-07-02 ENCOUNTER — Encounter (HOSPITAL_COMMUNITY): Payer: Self-pay

## 2023-07-02 VITALS — BP 132/70 | HR 57 | Temp 98.1°F | Ht 70.0 in | Wt 137.0 lb

## 2023-07-02 DIAGNOSIS — N201 Calculus of ureter: Secondary | ICD-10-CM | POA: Insufficient documentation

## 2023-07-02 DIAGNOSIS — R072 Precordial pain: Secondary | ICD-10-CM

## 2023-07-02 DIAGNOSIS — Z01818 Encounter for other preprocedural examination: Secondary | ICD-10-CM | POA: Diagnosis not present

## 2023-07-02 DIAGNOSIS — E119 Type 2 diabetes mellitus without complications: Secondary | ICD-10-CM | POA: Diagnosis not present

## 2023-07-02 HISTORY — DX: Angina pectoris, unspecified: I20.9

## 2023-07-02 HISTORY — DX: Personal history of urinary calculi: Z87.442

## 2023-07-02 LAB — CBC
HCT: 42.9 % (ref 39.0–52.0)
Hemoglobin: 13.2 g/dL (ref 13.0–17.0)
MCH: 28.7 pg (ref 26.0–34.0)
MCHC: 30.8 g/dL (ref 30.0–36.0)
MCV: 93.3 fL (ref 80.0–100.0)
Platelets: 240 10*3/uL (ref 150–400)
RBC: 4.6 MIL/uL (ref 4.22–5.81)
RDW: 14.7 % (ref 11.5–15.5)
WBC: 7.4 10*3/uL (ref 4.0–10.5)
nRBC: 0 % (ref 0.0–0.2)

## 2023-07-02 LAB — BASIC METABOLIC PANEL WITH GFR
Anion gap: 8 (ref 5–15)
BUN: 20 mg/dL (ref 8–23)
CO2: 24 mmol/L (ref 22–32)
Calcium: 8.9 mg/dL (ref 8.9–10.3)
Chloride: 101 mmol/L (ref 98–111)
Creatinine, Ser: 0.87 mg/dL (ref 0.61–1.24)
GFR, Estimated: >60 mL/min (ref >60)
Glucose, Bld: 197 mg/dL — ABNORMAL HIGH (ref 70–99)
Potassium: 4.5 mmol/L (ref 3.5–5.1)
Sodium: 133 mmol/L — ABNORMAL LOW (ref 135–145)

## 2023-07-02 LAB — GLUCOSE, CAPILLARY: Glucose-Capillary: 175 mg/dL — ABNORMAL HIGH (ref 70–99)

## 2023-07-02 NOTE — Progress Notes (Signed)
 For Anesthesia: PCP - Dorena Gander, MD  Cardiologist - Maudine Sos, MD  Clearance:Monge, Nelva Bang, NP : 06/24/23 Bowel Prep reminder:  Chest x-ray - 05/28/23 EKG - 05/28/23 Stress Test -  ECHO - 05/16/23 Cardiac Cath -  Pacemaker/ICD device last checked: Pacemaker orders received: Device Rep notified:  Spinal Cord Stimulator:N/A  Sleep Study - N/A CPAP -   Fasting Blood Sugar - 100's - 200's Checks Blood Sugar 4 - 5 times a day Date and result of last Hgb A1c-  Last dose of GLP1 agonist- N/A GLP1 instructions:   Last dose of SGLT-2 inhibitors- N/A SGLT-2 instructions:   Blood Thinner Instructions:N/A Aspirin  Instructions: Last Dose:  Activity level: Can go up a flight of stairs and activities of daily living without stopping and without chest pain and/or shortness of breath   Able to exercise without chest pain and/or shortness of breath  Anesthesia review: Hx: Chest pain,Myocardial bridge,DIA.  Patient denies shortness of breath, fever, cough and chest pain at PAT appointment   Patient verbalized understanding of instructions that were given to them at the PAT appointment. Patient was also instructed that they will need to review over the PAT instructions again at home before surgery.

## 2023-07-03 NOTE — Anesthesia Preprocedure Evaluation (Addendum)
 Anesthesia Evaluation  Patient identified by MRN, date of birth, ID band Patient awake    Reviewed: Allergy & Precautions, NPO status , Patient's Chart, lab work & pertinent test results  History of Anesthesia Complications Negative for: history of anesthetic complications  Airway Mallampati: II  TM Distance: >3 FB Neck ROM: Full    Dental no notable dental hx. (+) Teeth Intact, Dental Advisory Given   Pulmonary neg pulmonary ROS   Pulmonary exam normal breath sounds clear to auscultation       Cardiovascular (-) hypertension+ angina (Pt w hx of Myocardial Bridge- hx of elevated troponin)  (-) Past MI Normal cardiovascular exam Rhythm:Regular Rate:Normal  05/16/23 TTE 1. Left ventricular ejection fraction, by estimation, is 60 to 65%. Left  ventricular ejection fraction by PLAX is 61 %. The left ventricle has  normal function. The left ventricle has no regional wall motion  abnormalities. Left ventricular diastolic  parameters are consistent with Grade I diastolic dysfunction (impaired  relaxation).   2. Right ventricular systolic function is normal. The right ventricular  size is normal. The estimated right ventricular systolic pressure is 20.0  mmHg.   3. The mitral valve is grossly normal. Trivial mitral valve  regurgitation.   4. The aortic valve is tricuspid. Aortic valve regurgitation is not  visualized.   5. The inferior vena cava is dilated in size with >50% respiratory  variability, suggesting right atrial pressure of 8 mmHg.       Neuro/Psych negative neurological ROS     GI/Hepatic   Endo/Other  diabetes, Type 2Hypothyroidism    Renal/GU Renal diseaseLab Results      Component                Value               Date                      NA                       133 (L)             07/02/2023                CL                       101                 07/02/2023                K                        4.5                  07/02/2023                CO2                      24                  07/02/2023                BUN                      20                  07/02/2023  CREATININE               0.87                07/02/2023                GFRNONAA                 >60                 07/02/2023                CALCIUM                  8.9                 07/02/2023                ALBUMIN                   2.1 (L)             05/31/2023                GLUCOSE                  197 (H)             07/02/2023                Musculoskeletal   Abdominal   Peds  Hematology Lab Results      Component                Value               Date                      WBC                      7.4                 07/02/2023                HGB                      13.2                07/02/2023                HCT                      42.9                07/02/2023                MCV                      93.3                07/02/2023                PLT                      240                 07/02/2023              Anesthesia Other Findings Al: Ciprofoloxin, tramadol  Reproductive/Obstetrics  Anesthesia Physical Anesthesia Plan  ASA: 3  Anesthesia Plan: General   Post-op Pain Management: Precedex   Induction: Intravenous  PONV Risk Score and Plan: 3 and Treatment may vary due to age or medical condition and Ondansetron   Airway Management Planned: LMA  Additional Equipment: None  Intra-op Plan:   Post-operative Plan: Extubation in OR  Informed Consent: I have reviewed the patients History and Physical, chart, labs and discussed the procedure including the risks, benefits and alternatives for the proposed anesthesia with the patient or authorized representative who has indicated his/her understanding and acceptance.     Dental advisory given  Plan Discussed with: CRNA and Surgeon  Anesthesia Plan Comments: (See PAT  note 07/02/2023)       Anesthesia Quick Evaluation

## 2023-07-03 NOTE — Progress Notes (Signed)
 Anesthesia Chart Review   Case: [id number] Date/Time: 07/08/23 1545   Procedure: CYSTOSCOPY/URETEROSCOPY/HOLMIUM LASER/STENT PLACEMENT (Left)   Anesthesia type: General   Diagnosis: Left ureteral calculus [N20.1]   Pre-op diagnosis: Left ureteral calculus   Location: [facility] PROCEDURE [facility] / [facility]   Surgeons: Mellie Sprinkle., MD       DISCUSSION:83 y.o. never smoker with h/o DM II, myocardial bridge , left ureteral calculus scheduled for above procedure 07/08/2023 with Dr. Oda Bence.   Recent admission 4/2-4/06/2023 with sepsis due to UTI in setting of left ureteral calculus. Stent placed during admission.   Pt last seen by cardio 06/24/2023. 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  Recent elevation in troponin in the setting of acute sepsis. Recent echo showed EF 60 to 65%, G1 DD. Stable with no anginal symptoms. He is walking 2 to 3 miles a day.  Prior to his hospitalization he was running 5 miles every other day. Given lack of symptoms, recent reassuring echo, and through shared decision making, will defer further ischemic evaluation at this time. "  VS: BP 132/70   Pulse (!) 57   Temp 36.7 C (Oral)   Ht 5\' 10"  (1.778 m)   Wt 62.1 kg   SpO2 100%   BMI 19.66 kg/m   PROVIDERS: Dorena Gander, MD is PCP   Cardiologist - Maudine Sos, MD  LABS: Labs reviewed: Acceptable for surgery. (all labs ordered are listed, but only abnormal results are displayed)  Labs Reviewed  BASIC METABOLIC PANEL WITH GFR - Abnormal; Notable for the following components:      Result Value   Sodium 133 (*)    Glucose, Bld 197 (*)    All other components within normal limits  GLUCOSE, CAPILLARY - Abnormal; Notable for the following components:   Glucose-Capillary 175 (*)    All other components within normal limits  CBC     IMAGES:   EKG:   CV: Echo 05/16/23 1. Left ventricular  ejection fraction, by estimation, is 60 to 65%. Left  ventricular ejection fraction by PLAX is 61 %. The left ventricle has  normal function. The left ventricle has no regional wall motion  abnormalities. Left ventricular diastolic  parameters are consistent with Grade I diastolic dysfunction (impaired  relaxation).   2. Right ventricular systolic function is normal. The right ventricular  size is normal. The estimated right ventricular systolic pressure is 20.0  mmHg.   3. The mitral valve is grossly normal. Trivial mitral valve  regurgitation.   4. The aortic valve is tricuspid. Aortic valve regurgitation is not  visualized.   5. The inferior vena cava is dilated in size with >50% respiratory  variability, suggesting right atrial pressure of 8 mmHg.   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   Thyroid  disease     Past Surgical History:  Procedure Laterality Date   CYSTOSCOPY W/ URETERAL STENT PLACEMENT Left 05/29/2023   Procedure: CYSTOSCOPY, WITH RETROGRADE PYELOGRAM AND URETERAL STENT INSERTION;  Surgeon: Mellie Sprinkle., MD;  Location: [facility] OR;  Service: Urology;  Laterality: Left;  TONSILLECTOMY      MEDICATIONS:  acetaminophen  (TYLENOL ) 500 MG tablet   Barberry-Oreg Grape-Goldenseal (BERBERINE COMPLEX PO)   cephALEXin  (KEFLEX ) 500 MG capsule   finasteride  (PROSCAR ) 5 MG tablet   fluticasone  (FLONASE ) 50 MCG/ACT nasal spray   glipiZIDE (GLUCOTROL XL) 5 MG 24 hr tablet   ketorolac  (TORADOL ) 10 MG tablet   Magnesium Glycinate 100 MG CAPS   Menaquinone-7 (VITAMIN K2) 100 MCG CAPS   metFORMIN (GLUCOPHAGE-XR) 500 MG 24 hr tablet   NITROSTAT  0.4 MG SL tablet   polyethylene glycol powder  (GLYCOLAX /MIRALAX ) 17 GM/SCOOP powder   Selenium 200 MCG TABS   silodosin (RAPAFLO) 8 MG CAPS capsule   thiamine (VITAMIN B-1) 100 MG tablet   vitamin B-12 (CYANOCOBALAMIN) 100 MCG tablet   No current facility-administered medications for this encounter.    Chick Cotton Ward, PA-C [facility] [phone]

## 2023-07-04 ENCOUNTER — Telehealth (HOSPITAL_BASED_OUTPATIENT_CLINIC_OR_DEPARTMENT_OTHER): Payer: Self-pay | Admitting: Cardiovascular Disease

## 2023-07-04 NOTE — Telephone Encounter (Signed)
  Per MyChart scheduling message:  Patient c/o Palpitations: STAT if patient c/o lightheadedness, shortness of breath, or chest pain  How long have you had palpitations/irregular HR/ Afib? Are you having the symptoms now?   Are you currently experiencing lightheadedness, SOB or CP?   Do you have a history of afib (atrial fibrillation) or irregular heart rhythm?   Have you checked your BP or HR? (document readings if available):   Are you experiencing any other symptoms?   Hello.... yesterday was the second time in a week that I've had symptoms. A little pain under my left armpit...dizzy...Aaron Aasfeeling faint when I lean forward....jitters..my blood pressure is 109/77...Aaron Aas pulse 90..no history of afib...Aaron AasAaron Aas just need to come in to check my heart. I'm on an antibiotic.Aaron AasAaron AasAaron Aas

## 2023-07-04 NOTE — Telephone Encounter (Signed)
 Spoke with patient who stated that he has had two recent episodes  He wakes up in am and feels jittery, pain under left shoulder into armpit  When bends over feels like he is going to pass out During these episodes they start in am and last all day  Yesterday he was having this feeling and blood pressure was 99/56 Today blood pressure 109/71 and feels ok  Does check his HR and BP regularly     HR typically 60's-80's but has been running up to 115-120 at times  Blood pressure readings are similar to what he had been having prior to recent hospitalization  Takes finasteride  and silodosin for BPH  Did use NTG x 2, used 1 and waited abut 10 minutes took another. After 15 minutes pain subsided but returned after an hour or so   Taking Cephalexin  for 21 days   Reviewed ED precautions and taking NTG with already lower blood pressure readings  Scheduled appointment with Charlton Cooler NP Monday 5/12

## 2023-07-07 ENCOUNTER — Ambulatory Visit (HOSPITAL_BASED_OUTPATIENT_CLINIC_OR_DEPARTMENT_OTHER): Admitting: Nurse Practitioner

## 2023-07-08 ENCOUNTER — Encounter (HOSPITAL_COMMUNITY): Payer: Self-pay | Admitting: Urology

## 2023-07-08 ENCOUNTER — Ambulatory Visit (HOSPITAL_COMMUNITY): Payer: Self-pay | Admitting: Physician Assistant

## 2023-07-08 ENCOUNTER — Encounter (HOSPITAL_COMMUNITY)
Admission: RE | Disposition: A | Discharge status: Home / Self Care | Payer: Self-pay | Source: Home / Self Care | Attending: Urology

## 2023-07-08 ENCOUNTER — Ambulatory Visit (HOSPITAL_COMMUNITY)
Admission: RE | Admit: 2023-07-08 | Discharge: 2023-07-08 | Disposition: A | Discharge status: Home / Self Care | Attending: Urology | Admitting: Urology

## 2023-07-08 ENCOUNTER — Ambulatory Visit (HOSPITAL_COMMUNITY)

## 2023-07-08 ENCOUNTER — Ambulatory Visit (HOSPITAL_BASED_OUTPATIENT_CLINIC_OR_DEPARTMENT_OTHER): Payer: Self-pay | Admitting: Anesthesiology

## 2023-07-08 DIAGNOSIS — N201 Calculus of ureter: Secondary | ICD-10-CM | POA: Diagnosis not present

## 2023-07-08 DIAGNOSIS — E039 Hypothyroidism, unspecified: Secondary | ICD-10-CM | POA: Diagnosis not present

## 2023-07-08 DIAGNOSIS — E119 Type 2 diabetes mellitus without complications: Secondary | ICD-10-CM

## 2023-07-08 DIAGNOSIS — N136 Pyonephrosis: Secondary | ICD-10-CM | POA: Diagnosis not present

## 2023-07-08 DIAGNOSIS — E872 Acidosis, unspecified: Secondary | ICD-10-CM | POA: Insufficient documentation

## 2023-07-08 DIAGNOSIS — E785 Hyperlipidemia, unspecified: Secondary | ICD-10-CM

## 2023-07-08 DIAGNOSIS — Z87440 Personal history of urinary (tract) infections: Secondary | ICD-10-CM

## 2023-07-08 HISTORY — PX: CYSTOSCOPY/URETEROSCOPY/HOLMIUM LASER/STENT PLACEMENT: SHX6546

## 2023-07-08 LAB — GLUCOSE, CAPILLARY
Glucose-Capillary: 203 mg/dL — ABNORMAL HIGH (ref 70–99)
Glucose-Capillary: 144 mg/dL — ABNORMAL HIGH (ref 70–99)
Glucose-Capillary: 165 mg/dL — ABNORMAL HIGH (ref 70–99)

## 2023-07-08 SURGERY — CYSTOSCOPY/URETEROSCOPY/HOLMIUM LASER/STENT PLACEMENT
Anesthesia: General | Laterality: Left

## 2023-07-08 MED ORDER — 0.9 % SODIUM CHLORIDE (POUR BTL) OPTIME
TOPICAL | Status: DC | PRN
Start: 2023-07-08 — End: 2023-07-08
  Administered 2023-07-08: 1000 mL

## 2023-07-08 MED ORDER — INSULIN ASPART 100 UNIT/ML IJ SOLN
0.0000 [IU] | INTRAMUSCULAR | Status: DC | PRN
  Administered 2023-07-08: 2 [IU] via SUBCUTANEOUS

## 2023-07-08 MED ORDER — LIDOCAINE HCL URETHRAL/MUCOSAL 2 % EX GEL
CUTANEOUS | Status: AC
  Filled 2023-07-08: qty 30

## 2023-07-08 MED ORDER — LACTATED RINGERS IV SOLN: INTRAVENOUS | Status: DC | PRN

## 2023-07-08 MED ORDER — DEXAMETHASONE SODIUM PHOSPHATE 10 MG/ML IJ SOLN
INTRAMUSCULAR | Status: AC
  Filled 2023-07-08: qty 1

## 2023-07-08 MED ORDER — LIDOCAINE HCL (PF) 2 % IJ SOLN
INTRAMUSCULAR | Status: AC
  Filled 2023-07-08: qty 5

## 2023-07-08 MED ORDER — PROPOFOL 10 MG/ML IV BOLUS
INTRAVENOUS | Status: AC
  Filled 2023-07-08: qty 20

## 2023-07-08 MED ORDER — IOHEXOL 300 MG/ML  SOLN
INTRAMUSCULAR | Status: DC | PRN
  Administered 2023-07-08: 10 mL

## 2023-07-08 MED ORDER — PROPOFOL 10 MG/ML IV BOLUS
INTRAVENOUS | Status: DC | PRN
  Administered 2023-07-08: 90 mg via INTRAVENOUS

## 2023-07-08 MED ORDER — CEFAZOLIN SODIUM-DEXTROSE 2-4 GM/100ML-% IV SOLN
2.0000 g | INTRAVENOUS | Status: AC
Start: 2023-07-08 — End: 2023-07-08
  Administered 2023-07-08: 2 g via INTRAVENOUS
  Filled 2023-07-08: qty 100

## 2023-07-08 MED ORDER — CEPHALEXIN 500 MG PO CAPS: 500.0000 mg | ORAL_CAPSULE | Freq: Every day | ORAL | 0 refills | Status: AC

## 2023-07-08 MED ORDER — LIDOCAINE HCL (CARDIAC) PF 100 MG/5ML IV SOSY
PREFILLED_SYRINGE | INTRAVENOUS | Status: DC | PRN
  Administered 2023-07-08: 80 mg via INTRAVENOUS

## 2023-07-08 MED ORDER — FENTANYL CITRATE (PF) 100 MCG/2ML IJ SOLN
INTRAMUSCULAR | Status: AC
Start: 2023-07-08 — End: ?
  Filled 2023-07-08: qty 2

## 2023-07-08 MED ORDER — DEXAMETHASONE SODIUM PHOSPHATE 10 MG/ML IJ SOLN
INTRAMUSCULAR | Status: DC | PRN
  Administered 2023-07-08: 5 mg via INTRAVENOUS

## 2023-07-08 MED ORDER — FENTANYL CITRATE (PF) 100 MCG/2ML IJ SOLN
INTRAMUSCULAR | Status: AC
  Filled 2023-07-08: qty 2

## 2023-07-08 MED ORDER — ONDANSETRON HCL 4 MG/2ML IJ SOLN
INTRAMUSCULAR | Status: DC | PRN
  Administered 2023-07-08: 4 mg via INTRAVENOUS

## 2023-07-08 MED ORDER — SODIUM CHLORIDE 0.9 % IR SOLN
Status: DC | PRN
  Administered 2023-07-08: 3000 mL

## 2023-07-08 MED ORDER — FENTANYL CITRATE (PF) 100 MCG/2ML IJ SOLN
INTRAMUSCULAR | Status: DC | PRN
  Administered 2023-07-08: 25 ug via INTRAVENOUS
  Administered 2023-07-08: 50 ug via INTRAVENOUS

## 2023-07-08 MED ORDER — CHLORHEXIDINE GLUCONATE 0.12 % MT SOLN
15.0000 mL | Freq: Once | OROMUCOSAL | Status: AC
  Administered 2023-07-08: 15 mL via OROMUCOSAL

## 2023-07-08 MED ORDER — LIDOCAINE HCL URETHRAL/MUCOSAL 2 % EX GEL
CUTANEOUS | Status: DC | PRN
  Administered 2023-07-08: 1

## 2023-07-08 MED ORDER — ORAL CARE MOUTH RINSE: 15.0000 mL | Freq: Once | OROMUCOSAL | Status: AC

## 2023-07-08 MED ORDER — EPHEDRINE SULFATE (PRESSORS) 50 MG/ML IJ SOLN
INTRAMUSCULAR | Status: DC | PRN
  Administered 2023-07-08 (×2): 10 mg via INTRAVENOUS
  Administered 2023-07-08: 5 mg via INTRAVENOUS

## 2023-07-08 MED ORDER — LACTATED RINGERS IV SOLN: INTRAVENOUS | Status: DC

## 2023-07-08 MED ORDER — PHENYLEPHRINE 80 MCG/ML (10ML) SYRINGE FOR IV PUSH (FOR BLOOD PRESSURE SUPPORT)
PREFILLED_SYRINGE | INTRAVENOUS | Status: DC | PRN
  Administered 2023-07-08 (×4): 80 ug via INTRAVENOUS

## 2023-07-08 MED ORDER — ONDANSETRON HCL 4 MG/2ML IJ SOLN
INTRAMUSCULAR | Status: AC
  Filled 2023-07-08: qty 2

## 2023-07-08 SURGICAL SUPPLY — 21 items
BAG URO CATCHER STRL LF (MISCELLANEOUS) ×1 IMPLANT
BASKET ZERO TIP NITINOL 2.4FR (BASKET) IMPLANT
CATH URETL OPEN 5X70 (CATHETERS) IMPLANT
CLOTH BEACON ORANGE TIMEOUT ST (SAFETY) ×1 IMPLANT
DRSG TEGADERM 2-3/8X2-3/4 SM (GAUZE/BANDAGES/DRESSINGS) IMPLANT
EXTRACTOR STONE 1.7FRX115CM (UROLOGICAL SUPPLIES) IMPLANT
FIBER LASER MOSES 365 DFL (Laser) IMPLANT
GLOVE SURG LX STRL 8.0 MICRO (GLOVE) ×1 IMPLANT
GOWN STRL REUS W/ TWL XL LVL3 (GOWN DISPOSABLE) ×1 IMPLANT
GUIDEWIRE STR DUAL SENSOR (WIRE) ×1 IMPLANT
GUIDEWIRE ZIPWRE .038 STRAIGHT (WIRE) IMPLANT
KIT TURNOVER KIT A (KITS) IMPLANT
MANIFOLD NEPTUNE II (INSTRUMENTS) ×1 IMPLANT
PACK CYSTO (CUSTOM PROCEDURE TRAY) ×1 IMPLANT
SHEATH NAVIGATOR HD 11/13X36 (SHEATH) IMPLANT
SHEATH NAVIGATOR HD 12/14X36 (SHEATH) IMPLANT
SHEATH URETERAL FLEX 12X35 (SHEATH) IMPLANT
STENT URET 6FRX26 CONTOUR (STENTS) IMPLANT
TRACTIP FLEXIVA PULS ID 200XHI (Laser) IMPLANT
TUBING CONNECTING 10 (TUBING) ×1 IMPLANT
TUBING UROLOGY SET (TUBING) ×1 IMPLANT

## 2023-07-08 NOTE — Transfer of Care (Signed)
 Immediate Anesthesia Transfer of Care Note  Patient: Charles Strong  Procedure(s) Performed: CYSTOSCOPY/URETEROSCOPY/HOLMIUM LASER/STENT PLACEMENT (Left)  Patient Location: PACU  Anesthesia Type:General  Level of Consciousness: awake and alert   Airway & Oxygen Therapy: Patient Spontanous Breathing and Patient connected to nasal cannula oxygen  Post-op Assessment: Report given to RN and Post -op Vital signs reviewed and stable  Post vital signs: Reviewed and stable  Last Vitals:  Vitals Value Taken Time  BP 139/76 07/08/23 1541  Temp    Pulse 72 07/08/23 1541  Resp 13 07/08/23 1541  SpO2 100 % 07/08/23 1541  Vitals shown include unfiled device data.  Last Pain:  Vitals:   07/08/23 1157  TempSrc:   PainSc: 0-No pain         Complications: No notable events documented.

## 2023-07-08 NOTE — Anesthesia Procedure Notes (Signed)
 Procedure Name: LMA Insertion Date/Time: 07/08/2023 2:43 PM  Performed by: Aliveah Gallant, CRNAPre-anesthesia Checklist: Patient identified, Emergency Drugs available, Suction available, Patient being monitored and Timeout performed Patient Re-evaluated:Patient Re-evaluated prior to induction Oxygen Delivery Method: Circle system utilized Preoxygenation: Pre-oxygenation with 100% oxygen Induction Type: IV induction LMA: LMA inserted LMA Size: 4.0 Placement Confirmation: positive ETCO2 and breath sounds checked- equal and bilateral

## 2023-07-08 NOTE — Interval H&P Note (Signed)
 History and Physical Interval Note:  07/08/2023 12:51 PM  Charles Strong  has presented today for surgery, with the diagnosis of Left ureteral calculus.  The various methods of treatment have been discussed with the patient and family. After consideration of risks, benefits and other options for treatment, the patient has consented to  Procedure(s): CYSTOSCOPY/URETEROSCOPY/HOLMIUM LASER/STENT PLACEMENT (Left) as a surgical intervention.  The patient's history has been reviewed, patient examined, no change in status, stable for surgery.  I have reviewed the patient's chart and labs.  Questions were answered to the patient's satisfaction.     Charles Strong

## 2023-07-08 NOTE — Op Note (Signed)
 OPERATIVE NOTE   Patient Name: Charles Strong  MRN: [id number]   Date of Procedure: 07/08/23    Preoperative diagnosis:  Left ureteral calculus  Postoperative diagnosis:  Left ureteral calculus  Procedure:  Cystoscopy Left retrograde pyelogram with intraoperative interpretation Left ureteroscopy with laser lithotripsy Exchange of left ureteral stent (69F x 26 cm, with tether)  Attending: Mellie Sprinkle, MD  Anesthesia: General  Estimated blood loss: 5 mL  Fluids: Per anesthesia record  Drains: 69F x 26 cm left ureteral stent with tether  Specimens: None  Antibiotics: Ancef 2 g IV  Findings: Significantly enlarged prostate with large median lobe making access to the left distal ureter quite difficult; 5 x 7 mm calculus in the distal left ureter  Indications:  83 year old male with left ureteral calculus presents for surgical management.  He initially presented to the emergency room on 05/29/2023 with fever, chills, left flank pain, and abdominal pain.  He was found to have an elevated white count, elevated lactate, and elevated creatinine.  Urine culture grew >100 K Klebsiella.  CT abdomen and pelvis showed an 8 mm calculus in the distal left ureter with mild left hydronephrosis.  He underwent urgent cystoscopy and left ureteral stent placement on 05/29/2023.  He was treated for his sepsis secondary to an obstructing stone with UTI.  Follow-up urine culture showed no growth.  He was continued on daily antibiotics.  He presents now for cystoscopy, left retrograde pyelogram, left ureteroscopy with laser lithotripsy, and exchange of left ureteral stent.  The procedure including potential risk was discussed with the patient in detail.  He understands and wishes to proceed as described.  Description of Procedure:  The patient was taken to the operating room suite and properly identified.  After successful induction of a general anesthetic, he was placed in the dorsolithotomy  position.  He received IV Ancef preoperatively.  The patient's genital area was prepped and draped in sterile fashion.  A preoperative timeout was performed. Scout film showed the left ureteral stent in good position.  A 5 x 7 mm calcification was seen at the distal aspect of the stent.  A nonspecific bowel gas pattern and was seen with no obvious bony abnormalities. Under direct visualization, a 21 French cystoscope was passed through the urethra and into the bladder.  The patient was noted to have significant enlargement of the prostate as well as a very large median lobe which protruded into the bladder.  The left ureteral stent was identified.  Using grasping forceps, the distal end of the stent was grasped and brought to the urethral meatus.  I initially attempted to pass a sensor wire through the stent but met resistance at the proximal aspect of the stent.  I then passed a Glidewire through the stent and curled the wire in the left renal pelvis.  The stent was removed.  A 5 French open-ended catheter was then passed over the Glidewire and into the proximal left ureter.  Contrast was injected confirming position within the left renal pelvis.  No obvious filling defects or abnormalities were appreciated.  I then passed a sensor wire through the open-ended catheter leaving this in place.  Ureteroscopy was attempted with a single channel semirigid ureteroscope.  However accessing the left distal ureter was extremely difficult due to the large median lobe of the prostate.  Using a guidewire through the ureteroscope, I was able to carefully access the distal ureter and passed the ureteroscope into the distal ureter.  A stone  was identified in the distal ureter.  Using the [facility] holmium laser, the stone was fragmented into multiple small fragments.  Care was taken to avoid any injury to the ureter during fragmentation.  Using an Ngage stone basket, I was able to remove several of the larger fragments dropping  these in the bladder.  Accessing the distal ureter was again very difficult and I elected not to proceed with further attempts at removal of the very small remaining stone fragments.  I felt like these would pass spontaneously.  Due to the manipulation required to access the distal ureter, I elected to place a ureteral stent.  A 90F x 26 cm left ureteral stent was placed under cystoscopic and fluoroscopic guidance.  The tether was left attached and brought through the urethral meatus.  The tether was secured to the dorsal aspect of the penis using a Tegaderm.  The bladder was drained with removal of the cystoscope.  Intraurethral lidocaine  jelly was placed.  No significant bleeding was noted from the prostatic urethra at the end of the procedure.  The patient was then extubated and taken to the postanesthesia care unit in stable condition.  Complications: None  Condition: Stable, extubated, transferred to PACU  Plan:  Discharge to home Return to office next week for stent removal.

## 2023-07-09 ENCOUNTER — Encounter (HOSPITAL_COMMUNITY): Payer: Self-pay | Admitting: Urology

## 2023-07-09 NOTE — Anesthesia Postprocedure Evaluation (Signed)
 Anesthesia Post Note  Patient: [name]  Procedure(s) Performed: CYSTOSCOPY/URETEROSCOPY/HOLMIUM LASER/STENT PLACEMENT (Left)     Patient location during evaluation: PACU Anesthesia Type: General Level of consciousness: awake and alert Pain management: pain level controlled Vital Signs Assessment: post-procedure vital signs reviewed and stable Respiratory status: spontaneous breathing, nonlabored ventilation, respiratory function stable and patient connected to nasal cannula oxygen Cardiovascular status: blood pressure returned to baseline and stable Postop Assessment: no apparent nausea or vomiting Anesthetic complications: no  No notable events documented.  Last Vitals:  Vitals:   07/08/23 1630 07/08/23 1651  BP: 120/75 (!) 140/70  Pulse: 67 66  Resp: 17   Temp:    SpO2: 100% 98%    Last Pain:  Vitals:   07/08/23 1651  TempSrc:   PainSc: 0-No pain   Pain Goal:                   Rosalita Combe

## 2023-07-14 ENCOUNTER — Ambulatory Visit: Admitting: Urology

## 2023-07-14 ENCOUNTER — Encounter: Payer: Self-pay | Admitting: Urology

## 2023-07-14 VITALS — BP 139/74 | HR 71 | Ht 70.0 in | Wt 133.0 lb

## 2023-07-14 DIAGNOSIS — Z87440 Personal history of urinary (tract) infections: Secondary | ICD-10-CM | POA: Diagnosis not present

## 2023-07-14 DIAGNOSIS — N201 Calculus of ureter: Secondary | ICD-10-CM

## 2023-07-14 LAB — MICROSCOPIC EXAMINATION: Bacteria, UA: NONE SEEN

## 2023-07-14 LAB — URINALYSIS, ROUTINE W REFLEX MICROSCOPIC
Bilirubin, UA: NEGATIVE
Glucose, UA: NEGATIVE
Ketones, UA: NEGATIVE
Nitrite, UA: NEGATIVE
Specific Gravity, UA: 1.01 (ref 1.005–1.030)
Urobilinogen, Ur: 0.2 mg/dL (ref 0.2–1.0)
pH, UA: 5.5 (ref 5.0–7.5)

## 2023-07-14 NOTE — Progress Notes (Signed)
 Assessment: 1. Ureteral calculus, left   2. History of UTI     Plan: Left ureteral stent removed via tether today. Complete antibiotics. Return to office in 1 month  Chief Complaint: Chief Complaint  Patient presents with   Nephrolithiasis    HPI: Charles Strong is a 83 y.o. male who presents for continued evaluation of left ureteral calculus. He presented to the emergency room on 05/29/23 with dehydration and weakness.  He reported fever, chills, left flank pain and abdominal pain.  Evaluation demonstrated a white blood cell count of 34.4K, lactate of 2.9, and creatinine of 1.92.  Urinalysis showed >50 RBCs, >50 WBCs, and many bacteria.  CT abdomen and pelvis with contrast showed an 8 mm calculus in the distal left ureter with mild left hydronephrosis, thick-walled bladder, and enlarged prostate.  He was hypotensive and tachycardic in the emergency room.  He received IV antibiotics as well as IV fluids. He underwent cystoscopy, left retrograde pyelogram, and left ureteral stent placement on 05/29/23. Urine culture grew >100K Klebsiella.   He is status post left ureteroscopy with laser lithotripsy and exchange of left ureteral stent on 07/08/2023.  The ureteral stone was removed at that time.  He presents today for stent removal. He continues on daily Keflex .  He is having some mild stent related symptoms.  No flank pain, fever, or chills.  Portions of the above documentation were copied from a prior visit for review purposes only.  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   Thyroid  disease     PSH: Past Surgical History:  Procedure Laterality Date   CYSTOSCOPY W/ URETERAL STENT PLACEMENT Left 05/29/2023   Procedure: CYSTOSCOPY, WITH RETROGRADE PYELOGRAM AND URETERAL STENT INSERTION;  Surgeon: Mellie Sprinkle., MD;  Location: [facility];  Service: Urology;  Laterality: Left;   CYSTOSCOPY/URETEROSCOPY/HOLMIUM LASER/STENT PLACEMENT Left 07/08/2023   Procedure: CYSTOSCOPY/URETEROSCOPY/HOLMIUM LASER/STENT PLACEMENT;  Surgeon: Mellie Sprinkle., MD;  Location: [facility];  Service: Urology;  Laterality: Left;   TONSILLECTOMY      SH: Social History   Tobacco Use   Smoking status: Never   Smokeless tobacco: Never  Vaping Use   Vaping status: Never Used  Substance Use Topics   Alcohol  use: No   Drug use: No    ROS: Constitutional:  Negative for fever, chills, weight loss CV: Negative for chest pain, previous MI, hypertension Respiratory:  Negative for shortness of breath, wheezing, sleep apnea, frequent cough GI:  Negative for nausea, vomiting, bloody stool, GERD  PE: BP 139/74   Pulse 71   Ht 5\' 10"  (1.778 m)   Wt 133 lb (60.3 kg)   BMI 19.08 kg/m  GENERAL APPEARANCE:  Well appearing, well developed, well nourished, NAD HEENT:  Atraumatic, normocephalic, oropharynx clear NECK:  Supple without lymphadenopathy or thyromegaly ABDOMEN:  Soft, non-tender, no masses EXTREMITIES:  Moves all extremities well, without clubbing, cyanosis, or edema  NEUROLOGIC:  Alert and oriented x 3, normal gait, CN II-XII grossly intact MENTAL STATUS:  appropriate BACK:  Non-tender to palpation, No CVAT SKIN:  Warm, dry, and intact  Results: U/A: 3-5 WBCs, 11-30 RBCs

## 2023-07-15 ENCOUNTER — Encounter (HOSPITAL_BASED_OUTPATIENT_CLINIC_OR_DEPARTMENT_OTHER): Payer: Self-pay | Admitting: Cardiovascular Disease

## 2023-07-16 ENCOUNTER — Encounter: Admitting: Urology

## 2023-08-15 ENCOUNTER — Ambulatory Visit: Admitting: Urology

## 2023-08-19 DIAGNOSIS — E1165 Type 2 diabetes mellitus with hyperglycemia: Secondary | ICD-10-CM | POA: Diagnosis not present

## 2023-08-19 DIAGNOSIS — I10 Essential (primary) hypertension: Secondary | ICD-10-CM | POA: Diagnosis not present

## 2023-08-19 DIAGNOSIS — E1142 Type 2 diabetes mellitus with diabetic polyneuropathy: Secondary | ICD-10-CM | POA: Diagnosis not present

## 2023-08-19 DIAGNOSIS — E039 Hypothyroidism, unspecified: Secondary | ICD-10-CM | POA: Diagnosis not present

## 2023-08-19 DIAGNOSIS — I2510 Atherosclerotic heart disease of native coronary artery without angina pectoris: Secondary | ICD-10-CM | POA: Diagnosis not present

## 2023-08-19 DIAGNOSIS — E785 Hyperlipidemia, unspecified: Secondary | ICD-10-CM | POA: Diagnosis not present

## 2023-08-19 DIAGNOSIS — N179 Acute kidney failure, unspecified: Secondary | ICD-10-CM | POA: Diagnosis not present

## 2023-08-21 ENCOUNTER — Ambulatory Visit: Attending: Emergency Medicine | Admitting: Emergency Medicine

## 2023-08-21 ENCOUNTER — Encounter: Payer: Self-pay | Admitting: Emergency Medicine

## 2023-08-21 VITALS — BP 102/64 | HR 56 | Ht 70.0 in | Wt 139.0 lb

## 2023-08-21 DIAGNOSIS — E063 Autoimmune thyroiditis: Secondary | ICD-10-CM

## 2023-08-21 DIAGNOSIS — Q245 Malformation of coronary vessels: Secondary | ICD-10-CM | POA: Diagnosis not present

## 2023-08-21 DIAGNOSIS — E782 Mixed hyperlipidemia: Secondary | ICD-10-CM

## 2023-08-21 DIAGNOSIS — R079 Chest pain, unspecified: Secondary | ICD-10-CM | POA: Diagnosis not present

## 2023-08-21 DIAGNOSIS — E119 Type 2 diabetes mellitus without complications: Secondary | ICD-10-CM | POA: Diagnosis not present

## 2023-08-21 NOTE — Patient Instructions (Signed)
 Medication Instructions:  Your physician recommends that you continue on your current medications as directed. Please refer to the Current Medication list given to you today.  *If you need a refill on your cardiac medications before your next appointment, please call your pharmacy*  Lab Work: NONE If you have labs (blood work) drawn today and your tests are completely normal, you will receive your results only by: MyChart Message (if you have MyChart) OR A paper copy in the mail If you have any lab test that is abnormal or we need to change your treatment, we will call you to review the results.  Testing/Procedures: NONE  Follow-Up: At [facility], you and your health needs are our priority.  As part of our continuing mission to provide you with exceptional heart care, our providers are all part of one team.  This team includes your primary Cardiologist (physician) and Advanced Practice Providers or APPs (Physician Assistants and Nurse Practitioners) who all work together to provide you with the care you need, when you need it.  Your next appointment:   3 months  Provider:   Rana, NP or Raford, MD  We recommend signing up for the patient portal called MyChart.  Sign up information is provided on this After Visit Summary.  MyChart is used to connect with patients for Virtual Visits (Telemedicine).  Patients are able to view lab/test results, encounter notes, upcoming appointments, etc.  Non-urgent messages can be sent to your provider as well.   To learn more about what you can do with MyChart, go to [email or web address].

## 2023-08-21 NOTE — Progress Notes (Signed)
 Cardiology Office Note:    Date:  08/21/2023  ID:  Charles Strong, DOB 1941-01-19, MRN [id number] PCP: Rolinda Millman, MD  [facility] Providers Cardiologist:  Annabella Scarce, MD       Patient Profile:       Chief Complaint: Acute visit for chest pains and lightheadedness History of Present Illness:  Charles Strong is a 83 y.o. male with visit-pertinent history of elevated troponin, myocardial bridge, hyperlipidemia, type 2 diabetes, Hashimoto's thyroiditis, BPH  ETT in 2016 was concerning for ischemia.  He subsequently declined cardiac catheterization at that time.  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  He was started on metoprolol .  He has had a prior episode of syncope that was thought to be due to taking both Imdur  and Cialis at the same time.  He was hospitalized in March 2025 in the setting of acute left-sided pyelonephritis.  Cardiology was consulted in the setting of elevated troponin.  Echocardiogram at the time showed LVEF 60 to 65%, grade 1 diastolic dysfunction.  He denies any chest pains at that time.  It was thought that his elevated troponin was due to demand ischemia in the setting of sepsis/AKI.  He was readmitted from 05/28/2023 through 05/31/2023 in the setting of sepsis with acute renal failure, septic shock secondary to acute cystitis, left ureteral calculus, AKI.  Urology was consulted and he underwent urethral stent placement.  He was last seen in clinic on 06/24/2023.  He is doing well at the time without anginal symptoms.  No medication changes were made.  He was to follow-up in 4 months.   Discussed the use of AI scribe software for clinical note transcription with the patient, who gave verbal consent to proceed.  History of Present Illness Charles Strong is an 83 year old male who presents with chest pain and dizziness.  Today patient notes he is doing well overall.  He has  been without chest pain and lightheadedness now for the last 3 weeks.  He is back to exercising regularly and running up to 5 miles a day while barefooted without any anginal symptoms.  Denies any exertional chest pains at this time.  He tells me he had chest pain that started around 2 months ago occurring primarily in the morning and sometimes at night.  The chest pains are always nonexertional.  Notes took nitroglycerin  which may have helped.  The chest pains had no radiation and and were never located in the same spot of his chest.  They were short lasting.  His chest pains lasted for approximately 2 to 3 weeks.  Not occurring daily.  He also mentioned episodes of lightheadedness during this time.  His lightheadedness has also entirely improved.  Symptoms have improved since kidney stent removal three weeks ago.  He monitors his heart rate at home with heart rates averaging in the 50s to 60s.  He denies any episodes of tachycardia.  He denies any episodes of palpitations, near-syncope, or syncope.  He denied any episodes of hypotension or hypoxia during these times.  He regularly monitors vital signs at home.  He does continue to work.  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  Overall today he is entirely asymptomatic and has been over the past 3 weeks.  He feels back to his baseline and  is happy with his current health.  Review of systems:  Please see the history of present illness. All other systems are reviewed and otherwise negative.      Studies Reviewed:    EKG Interpretation Date/Time:  Thursday August 21 2023 08:27:56 EDT Ventricular Rate:  53 PR Interval:  172 QRS Duration:  100 QT Interval:  432 QTC Calculation: 405 R Axis:   -29  Text Interpretation: Sinus bradycardia When compared with ECG of 28-May-2023 17:34, PREVIOUS ECG IS PRESENT Confirmed by Rana Dixon [phone]) on 08/21/2023 6:52:52  PM    Echocardiogram 05/16/2023 1. Left ventricular ejection fraction, by estimation, is 60 to 65%. Left  ventricular ejection fraction by PLAX is 61 %. The left ventricle has  normal function. The left ventricle has no regional wall motion  abnormalities. Left ventricular diastolic  parameters are consistent with Grade I diastolic dysfunction (impaired  relaxation).   2. Right ventricular systolic function is normal. The right ventricular  size is normal. The estimated right ventricular systolic pressure is 20.0  mmHg.   3. The mitral valve is grossly normal. Trivial mitral valve  regurgitation.   4. The aortic valve is tricuspid. Aortic valve regurgitation is not  visualized.   5. The inferior vena cava is dilated in size with >50% respiratory  variability, suggesting right atrial pressure of 8 mmHg.   Coronary CT 01/27/2015 1. Coronary calcium score of 0. This was 0 percentile for age and sex matched control. 2. Normal coronary origin.  Right dominance. 3.  Minimal non-calcified plaque. 4. 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 This is most probably culprit of patient's symptoms. Risk Assessment/Calculations:              Physical Exam:   VS:  BP 102/64 (BP Location: Right Arm, Patient Position: Sitting)   Pulse (!) 56   Ht 5' 10 (1.778 m)   Wt 139 lb (63 kg)   SpO2 98%   BMI 19.94 kg/m    Wt Readings from Last 3 Encounters:  08/21/23 139 lb (63 kg)  07/14/23 133 lb (60.3 kg)  07/08/23 137 lb (62.1 kg)    GEN: Well nourished, well developed in no acute distress NECK: No JVD; No carotid bruits CARDIAC: RRR, no murmurs, rubs, gallops RESPIRATORY:  Clear to auscultation without rales, wheezing or rhonchi  ABDOMEN: Soft, non-tender, non-distended EXTREMITIES:  No edema; No acute deformity      Assessment and Plan:  Atypical chest pains History of myocardial bridge Coronary CTA in 2016 showed no obstructive CAD, he was noted to have long myocardial bridge in mid  to distal LAD CT chest 02/2016 showing no significant coronary calcifications Echocardiogram 04/2023 with LVEF 60-65% with no RWMA - He had noted nonexertional atypical chest pains for 2-3 weeks over a month ago with associated lightheadedness.  The chest pains occurred at rest, were short in duration, and were always located in different areas in his chest.  His symptoms entirely resolved 3 weeks ago after he had his urethral stent removed.  He is now running 5 miles a day without any exertional symptoms or lightheadedness. - His EKG today was without acute ischemic changes - Long discussion with patient today as he is symptom-free without angina   - Through shared decision making, we decided to not pursue further ischemic testing at this time.  He will continue to monitor symptoms at home  Hyperlipidemia LDL 107 on 10/2022 - Has been managed with diet and exercise -  Recommend DASH diet (high in vegetables, fruits, low-fat dairy products, whole grains, poultry, fish, and nuts and low in sweets, sugar-sweetened beverages, and red meats), salt restriction and increase physical activity.   Type 2 diabetes A1c 7.3% on 04/2023 - Management per PCP  Hashimoto's thyroiditis TSH 9.92 on 4/25 - Management per PCP      Dispo:  Return in about 3 months (around 11/21/2023).  Signed, Lum LITTIE Louis, NP

## 2023-09-12 ENCOUNTER — Encounter: Payer: Self-pay | Admitting: Urology

## 2023-09-12 ENCOUNTER — Ambulatory Visit: Admitting: Urology

## 2023-09-12 VITALS — BP 139/70 | HR 73 | Ht 70.0 in | Wt 140.0 lb

## 2023-09-12 DIAGNOSIS — Z87440 Personal history of urinary (tract) infections: Secondary | ICD-10-CM

## 2023-09-12 DIAGNOSIS — N138 Other obstructive and reflux uropathy: Secondary | ICD-10-CM | POA: Diagnosis not present

## 2023-09-12 DIAGNOSIS — N401 Enlarged prostate with lower urinary tract symptoms: Secondary | ICD-10-CM | POA: Diagnosis not present

## 2023-09-12 DIAGNOSIS — N201 Calculus of ureter: Secondary | ICD-10-CM | POA: Diagnosis not present

## 2023-09-12 LAB — URINALYSIS, ROUTINE W REFLEX MICROSCOPIC
Bilirubin, UA: NEGATIVE
Ketones, UA: NEGATIVE
Leukocytes,UA: NEGATIVE
Nitrite, UA: NEGATIVE
Protein,UA: NEGATIVE
Specific Gravity, UA: 1.015 (ref 1.005–1.030)
Urobilinogen, Ur: 0.2 mg/dL (ref 0.2–1.0)
pH, UA: 5.5 (ref 5.0–7.5)

## 2023-09-12 LAB — MICROSCOPIC EXAMINATION: Bacteria, UA: NONE SEEN

## 2023-09-12 NOTE — Progress Notes (Signed)
 Assessment: 1. Ureteral calculus, left   2. History of UTI   3. BPH with obstruction/lower urinary tract symptoms     Plan: Schedule for renal U/S - will call with results Stone prevention discussed and information provided Return to office in 1 year for continued management of BPH.  Chief Complaint: Chief Complaint  Patient presents with   Nephrolithiasis    HPI: Charles Strong is a 83 y.o. male who presents for continued evaluation of left ureteral calculus. He presented to the emergency room on 05/29/23 with dehydration and weakness.  He reported fever, chills, left flank pain and abdominal pain.  Evaluation demonstrated a white blood cell count of 34.4K, lactate of 2.9, and creatinine of 1.92.  Urinalysis showed >50 RBCs, >50 WBCs, and many bacteria.  CT abdomen and pelvis with contrast showed an 8 mm calculus in the distal left ureter with mild left hydronephrosis, thick-walled bladder, and enlarged prostate.  He was hypotensive and tachycardic in the emergency room.  He received IV antibiotics as well as IV fluids. He underwent cystoscopy, left retrograde pyelogram, and left ureteral stent placement on 05/29/23. Urine culture grew >100K Klebsiella.   He is status post left ureteroscopy with laser lithotripsy and exchange of left ureteral stent on 07/08/2023.  The ureteral stone was removed at that time.   His stent was removed on 07/14/2023.  He returns today for follow-up.  He is doing very well.  No lower urinary tract symptoms.  No flank pain.  No dysuria or gross hematuria. He continues on silodosin and finasteride  for BPH.  Portions of the above documentation were copied from a prior visit for review purposes only.  Allergies: Allergies  Allergen Reactions   Ciprofloxacin Hives and Nausea And Vomiting    Other Reaction(s): dizziness   Tramadol Nausea And Vomiting and Other (See Comments)    Blackouts,chills,    Calcium Fortified Cookie [Nutritional Supplements]  Diarrhea   Egg-Derived Products Diarrhea   Metformin And Related Diarrhea    PMH: 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   Thyroid  disease     PSH: Past Surgical History:  Procedure Laterality Date   CYSTOSCOPY W/ URETERAL STENT PLACEMENT Left 05/29/2023   Procedure: CYSTOSCOPY, WITH RETROGRADE PYELOGRAM AND URETERAL STENT INSERTION;  Surgeon: Roseann Adine PARAS., MD;  Location: [facility];  Service: Urology;  Laterality: Left;   CYSTOSCOPY/URETEROSCOPY/HOLMIUM LASER/STENT PLACEMENT Left 07/08/2023   Procedure: CYSTOSCOPY/URETEROSCOPY/HOLMIUM LASER/STENT PLACEMENT;  Surgeon: Roseann Adine PARAS., MD;  Location: [facility];  Service: Urology;  Laterality: Left;   TONSILLECTOMY      SH: Social History   Tobacco Use   Smoking status: Never   Smokeless tobacco: Never  Vaping Use   Vaping status: Never Used  Substance Use Topics   Alcohol  use: No   Drug use: No   ROS: Constitutional:  Negative for fever, chills, weight loss CV: Negative for chest pain, previous MI, hypertension Respiratory:  Negative for shortness of breath, wheezing, sleep apnea, frequent cough GI:  Negative for nausea, vomiting, bloody stool, GERD  PE: BP 139/70   Pulse 73   Ht 5' 10 (1.778 m)   Wt 140 lb (63.5 kg)   BMI 20.09 kg/m  GENERAL APPEARANCE:  Well appearing, well developed, well nourished, NAD HEENT:  Atraumatic, normocephalic, oropharynx clear NECK:  Supple without lymphadenopathy or thyromegaly ABDOMEN:  Soft, non-tender, no masses EXTREMITIES:  Moves all extremities well, without clubbing, cyanosis, or edema NEUROLOGIC:  Alert and oriented x 3, normal gait, CN II-XII grossly intact MENTAL STATUS:  appropriate BACK:   Non-tender to palpation, No CVAT SKIN:  Warm, dry, and intact  Results: U/A: 0-5 WBCs, 0-2 RBCs

## 2023-09-15 ENCOUNTER — Telehealth: Payer: Self-pay | Admitting: Urology

## 2023-09-15 NOTE — Telephone Encounter (Signed)
 Patient called due to not having yet received a call from imaging. I sent a message to imaging now letting them know to call patient when free to get him scheduled. I did look back and it looked like no one had messaged him yet.

## 2023-09-18 ENCOUNTER — Ambulatory Visit (HOSPITAL_BASED_OUTPATIENT_CLINIC_OR_DEPARTMENT_OTHER)
Admission: RE | Admit: 2023-09-18 | Discharge: 2023-09-18 | Disposition: A | Discharge status: Home / Self Care | Source: Ambulatory Visit | Attending: Urology | Admitting: Urology

## 2023-09-18 DIAGNOSIS — N201 Calculus of ureter: Secondary | ICD-10-CM | POA: Insufficient documentation

## 2023-09-18 DIAGNOSIS — N400 Enlarged prostate without lower urinary tract symptoms: Secondary | ICD-10-CM | POA: Diagnosis not present

## 2023-09-25 ENCOUNTER — Ambulatory Visit: Payer: Self-pay | Admitting: Urology

## 2023-09-29 ENCOUNTER — Other Ambulatory Visit: Payer: Self-pay

## 2023-11-10 ENCOUNTER — Encounter: Payer: Self-pay | Admitting: *Deleted

## 2023-11-13 ENCOUNTER — Ambulatory Visit: Admitting: Emergency Medicine

## 2023-11-18 ENCOUNTER — Telehealth: Payer: Self-pay | Admitting: Urology

## 2023-11-18 ENCOUNTER — Other Ambulatory Visit: Payer: Self-pay

## 2023-11-18 MED ORDER — FINASTERIDE 5 MG PO TABS: 5.0000 mg | ORAL_TABLET | Freq: Every day | ORAL | 3 refills | Status: AC

## 2023-11-18 NOTE — Telephone Encounter (Signed)
 Patient called in today to have finasteride  (PROSCAR ) 5 MG tablet  90 day supply medication refilled.  Reviewed pharmacy with patient - [facility]   The medication refill sent as requested per patient.  Has the patient been seen within the last year? yes Does the patient need an appointment?   Scheduled 09/09/24

## 2023-11-18 NOTE — Telephone Encounter (Signed)
 Refill sent.

## 2023-12-05 ENCOUNTER — Ambulatory Visit: Admitting: Emergency Medicine

## 2023-12-17 DIAGNOSIS — N401 Enlarged prostate with lower urinary tract symptoms: Secondary | ICD-10-CM | POA: Diagnosis not present

## 2023-12-17 DIAGNOSIS — E1142 Type 2 diabetes mellitus with diabetic polyneuropathy: Secondary | ICD-10-CM | POA: Diagnosis not present

## 2023-12-17 DIAGNOSIS — I700 Atherosclerosis of aorta: Secondary | ICD-10-CM | POA: Diagnosis not present

## 2023-12-17 DIAGNOSIS — R946 Abnormal results of thyroid function studies: Secondary | ICD-10-CM | POA: Diagnosis not present

## 2023-12-24 DIAGNOSIS — I700 Atherosclerosis of aorta: Secondary | ICD-10-CM | POA: Diagnosis not present

## 2023-12-24 DIAGNOSIS — R809 Proteinuria, unspecified: Secondary | ICD-10-CM | POA: Diagnosis not present

## 2023-12-24 DIAGNOSIS — I10 Essential (primary) hypertension: Secondary | ICD-10-CM | POA: Diagnosis not present

## 2023-12-24 DIAGNOSIS — E039 Hypothyroidism, unspecified: Secondary | ICD-10-CM | POA: Diagnosis not present

## 2023-12-24 DIAGNOSIS — E1165 Type 2 diabetes mellitus with hyperglycemia: Secondary | ICD-10-CM | POA: Diagnosis not present

## 2024-03-04 NOTE — Progress Notes (Signed)
 TREQUAN MARSOLEK                                          MRN: [id number]   03/04/2024   The [facility] Quality Team Specialist reviewed this patient medical record for the purposes of chart review for care gap closure. The following were reviewed: chart review for care gap closure-controlling blood pressure.    [facility] Quality Team

## 2024-08-12 ENCOUNTER — Ambulatory Visit: Admitting: Urology

## 2024-09-09 ENCOUNTER — Ambulatory Visit: Admitting: Urology
